# Patient Record
Sex: Female | Born: 1955 | Race: White | Hispanic: No | Marital: Married | Smoking: Former smoker
Health system: Southern US, Community
[De-identification: ages and names within clinical notes are randomized; demographics above are authoritative.]

## PROBLEM LIST (undated history)

## (undated) DIAGNOSIS — D649 Anemia, unspecified: Secondary | ICD-10-CM

## (undated) DIAGNOSIS — N186 End stage renal disease: Secondary | ICD-10-CM

## (undated) DIAGNOSIS — K219 Gastro-esophageal reflux disease without esophagitis: Secondary | ICD-10-CM

## (undated) DIAGNOSIS — Z9889 Other specified postprocedural states: Secondary | ICD-10-CM

## (undated) DIAGNOSIS — K254 Chronic or unspecified gastric ulcer with hemorrhage: Secondary | ICD-10-CM

## (undated) DIAGNOSIS — R112 Nausea with vomiting, unspecified: Secondary | ICD-10-CM

## (undated) DIAGNOSIS — T8859XA Other complications of anesthesia, initial encounter: Secondary | ICD-10-CM

## (undated) DIAGNOSIS — J189 Pneumonia, unspecified organism: Secondary | ICD-10-CM

## (undated) DIAGNOSIS — E785 Hyperlipidemia, unspecified: Secondary | ICD-10-CM

## (undated) DIAGNOSIS — K429 Umbilical hernia without obstruction or gangrene: Secondary | ICD-10-CM

## (undated) DIAGNOSIS — R197 Diarrhea, unspecified: Secondary | ICD-10-CM

## (undated) DIAGNOSIS — F419 Anxiety disorder, unspecified: Secondary | ICD-10-CM

## (undated) DIAGNOSIS — T4145XA Adverse effect of unspecified anesthetic, initial encounter: Secondary | ICD-10-CM

## (undated) DIAGNOSIS — R7303 Prediabetes: Secondary | ICD-10-CM

## (undated) DIAGNOSIS — I10 Essential (primary) hypertension: Secondary | ICD-10-CM

## (undated) HISTORY — PX: EYE SURGERY: SHX253

## (undated) HISTORY — PX: TOTAL ABDOMINAL HYSTERECTOMY W/ BILATERAL SALPINGOOPHORECTOMY: SHX83

## (undated) HISTORY — DX: Chronic or unspecified gastric ulcer with hemorrhage: K25.4

## (undated) HISTORY — DX: Prediabetes: R73.03

## (undated) HISTORY — PX: ABDOMINAL HYSTERECTOMY: SHX81

## (undated) HISTORY — DX: Anemia, unspecified: D64.9

## (undated) HISTORY — PX: TRABECULECTOMY: SHX107

## (undated) HISTORY — PX: CHOLECYSTECTOMY: SHX55

## (undated) HISTORY — PX: INSERTION OF DIALYSIS CATHETER: SHX1324

## (undated) HISTORY — PX: COLONOSCOPY W/ POLYPECTOMY: SHX1380

## (undated) HISTORY — DX: Hyperlipidemia, unspecified: E78.5

## (undated) HISTORY — PX: APPENDECTOMY: SHX54

## (undated) HISTORY — DX: Essential (primary) hypertension: I10

## (undated) HISTORY — DX: Gastro-esophageal reflux disease without esophagitis: K21.9

## (undated) HISTORY — PX: CHOLECYSTECTOMY OPEN: SUR202

---

## 2015-07-31 DIAGNOSIS — R112 Nausea with vomiting, unspecified: Secondary | ICD-10-CM | POA: Diagnosis not present

## 2015-07-31 DIAGNOSIS — D649 Anemia, unspecified: Secondary | ICD-10-CM | POA: Diagnosis not present

## 2015-07-31 DIAGNOSIS — N185 Chronic kidney disease, stage 5: Secondary | ICD-10-CM | POA: Diagnosis not present

## 2015-07-31 DIAGNOSIS — K253 Acute gastric ulcer without hemorrhage or perforation: Secondary | ICD-10-CM | POA: Diagnosis not present

## 2015-08-08 DIAGNOSIS — N390 Urinary tract infection, site not specified: Secondary | ICD-10-CM | POA: Diagnosis not present

## 2015-08-08 DIAGNOSIS — R809 Proteinuria, unspecified: Secondary | ICD-10-CM | POA: Diagnosis not present

## 2015-08-08 DIAGNOSIS — R112 Nausea with vomiting, unspecified: Secondary | ICD-10-CM | POA: Diagnosis not present

## 2015-08-08 DIAGNOSIS — N185 Chronic kidney disease, stage 5: Secondary | ICD-10-CM | POA: Diagnosis not present

## 2015-08-08 DIAGNOSIS — I10 Essential (primary) hypertension: Secondary | ICD-10-CM | POA: Diagnosis not present

## 2015-08-08 DIAGNOSIS — N184 Chronic kidney disease, stage 4 (severe): Secondary | ICD-10-CM | POA: Diagnosis not present

## 2015-08-08 DIAGNOSIS — K253 Acute gastric ulcer without hemorrhage or perforation: Secondary | ICD-10-CM | POA: Diagnosis not present

## 2015-08-15 DIAGNOSIS — A048 Other specified bacterial intestinal infections: Secondary | ICD-10-CM | POA: Diagnosis not present

## 2015-08-15 DIAGNOSIS — K253 Acute gastric ulcer without hemorrhage or perforation: Secondary | ICD-10-CM | POA: Diagnosis not present

## 2015-08-15 DIAGNOSIS — Z01818 Encounter for other preprocedural examination: Secondary | ICD-10-CM | POA: Diagnosis not present

## 2015-08-21 DIAGNOSIS — K2970 Gastritis, unspecified, without bleeding: Secondary | ICD-10-CM | POA: Diagnosis not present

## 2015-08-21 DIAGNOSIS — N185 Chronic kidney disease, stage 5: Secondary | ICD-10-CM | POA: Diagnosis not present

## 2015-08-21 DIAGNOSIS — K5281 Eosinophilic gastritis or gastroenteritis: Secondary | ICD-10-CM | POA: Diagnosis not present

## 2015-08-21 DIAGNOSIS — A048 Other specified bacterial intestinal infections: Secondary | ICD-10-CM | POA: Diagnosis not present

## 2015-08-21 DIAGNOSIS — K2270 Barrett's esophagus without dysplasia: Secondary | ICD-10-CM | POA: Diagnosis not present

## 2015-08-23 DIAGNOSIS — K200 Eosinophilic esophagitis: Secondary | ICD-10-CM | POA: Diagnosis not present

## 2015-08-24 DIAGNOSIS — K200 Eosinophilic esophagitis: Secondary | ICD-10-CM | POA: Diagnosis not present

## 2015-08-28 DIAGNOSIS — R112 Nausea with vomiting, unspecified: Secondary | ICD-10-CM | POA: Diagnosis not present

## 2015-08-28 DIAGNOSIS — R030 Elevated blood-pressure reading, without diagnosis of hypertension: Secondary | ICD-10-CM | POA: Diagnosis not present

## 2015-08-28 DIAGNOSIS — R1084 Generalized abdominal pain: Secondary | ICD-10-CM | POA: Diagnosis not present

## 2015-08-29 DIAGNOSIS — K2970 Gastritis, unspecified, without bleeding: Secondary | ICD-10-CM | POA: Diagnosis not present

## 2015-08-29 DIAGNOSIS — N184 Chronic kidney disease, stage 4 (severe): Secondary | ICD-10-CM | POA: Diagnosis not present

## 2015-08-29 DIAGNOSIS — B9681 Helicobacter pylori [H. pylori] as the cause of diseases classified elsewhere: Secondary | ICD-10-CM | POA: Diagnosis not present

## 2015-08-29 DIAGNOSIS — I129 Hypertensive chronic kidney disease with stage 1 through stage 4 chronic kidney disease, or unspecified chronic kidney disease: Secondary | ICD-10-CM | POA: Diagnosis not present

## 2015-08-30 DIAGNOSIS — I509 Heart failure, unspecified: Secondary | ICD-10-CM | POA: Diagnosis not present

## 2015-08-30 DIAGNOSIS — J95821 Acute postprocedural respiratory failure: Secondary | ICD-10-CM | POA: Diagnosis not present

## 2015-08-30 DIAGNOSIS — T363X5A Adverse effect of macrolides, initial encounter: Secondary | ICD-10-CM | POA: Diagnosis not present

## 2015-08-30 DIAGNOSIS — N184 Chronic kidney disease, stage 4 (severe): Secondary | ICD-10-CM | POA: Diagnosis not present

## 2015-08-30 DIAGNOSIS — K8020 Calculus of gallbladder without cholecystitis without obstruction: Secondary | ICD-10-CM | POA: Diagnosis not present

## 2015-08-30 DIAGNOSIS — K2900 Acute gastritis without bleeding: Secondary | ICD-10-CM | POA: Diagnosis not present

## 2015-08-30 DIAGNOSIS — E1142 Type 2 diabetes mellitus with diabetic polyneuropathy: Secondary | ICD-10-CM | POA: Diagnosis not present

## 2015-08-30 DIAGNOSIS — I10 Essential (primary) hypertension: Secondary | ICD-10-CM | POA: Diagnosis not present

## 2015-08-30 DIAGNOSIS — I132 Hypertensive heart and chronic kidney disease with heart failure and with stage 5 chronic kidney disease, or end stage renal disease: Secondary | ICD-10-CM | POA: Diagnosis not present

## 2015-08-30 DIAGNOSIS — Z6827 Body mass index (BMI) 27.0-27.9, adult: Secondary | ICD-10-CM | POA: Diagnosis not present

## 2015-08-30 DIAGNOSIS — K3184 Gastroparesis: Secondary | ICD-10-CM | POA: Diagnosis not present

## 2015-08-30 DIAGNOSIS — D631 Anemia in chronic kidney disease: Secondary | ICD-10-CM | POA: Diagnosis not present

## 2015-08-30 DIAGNOSIS — I5043 Acute on chronic combined systolic (congestive) and diastolic (congestive) heart failure: Secondary | ICD-10-CM | POA: Diagnosis not present

## 2015-08-30 DIAGNOSIS — R110 Nausea: Secondary | ICD-10-CM | POA: Diagnosis not present

## 2015-08-30 DIAGNOSIS — Z6831 Body mass index (BMI) 31.0-31.9, adult: Secondary | ICD-10-CM | POA: Diagnosis not present

## 2015-08-30 DIAGNOSIS — E1143 Type 2 diabetes mellitus with diabetic autonomic (poly)neuropathy: Secondary | ICD-10-CM | POA: Diagnosis not present

## 2015-08-30 DIAGNOSIS — K8012 Calculus of gallbladder with acute and chronic cholecystitis without obstruction: Secondary | ICD-10-CM | POA: Diagnosis not present

## 2015-08-30 DIAGNOSIS — K8010 Calculus of gallbladder with chronic cholecystitis without obstruction: Secondary | ICD-10-CM | POA: Diagnosis not present

## 2015-08-30 DIAGNOSIS — E1121 Type 2 diabetes mellitus with diabetic nephropathy: Secondary | ICD-10-CM | POA: Diagnosis not present

## 2015-08-30 DIAGNOSIS — Z6833 Body mass index (BMI) 33.0-33.9, adult: Secondary | ICD-10-CM | POA: Diagnosis not present

## 2015-08-30 DIAGNOSIS — Z6832 Body mass index (BMI) 32.0-32.9, adult: Secondary | ICD-10-CM | POA: Diagnosis not present

## 2015-08-30 DIAGNOSIS — E876 Hypokalemia: Secondary | ICD-10-CM | POA: Diagnosis not present

## 2015-08-30 DIAGNOSIS — N189 Chronic kidney disease, unspecified: Secondary | ICD-10-CM | POA: Diagnosis not present

## 2015-08-30 DIAGNOSIS — R112 Nausea with vomiting, unspecified: Secondary | ICD-10-CM | POA: Diagnosis not present

## 2015-08-31 DIAGNOSIS — R112 Nausea with vomiting, unspecified: Secondary | ICD-10-CM | POA: Diagnosis not present

## 2015-08-31 DIAGNOSIS — K8020 Calculus of gallbladder without cholecystitis without obstruction: Secondary | ICD-10-CM | POA: Diagnosis not present

## 2015-08-31 DIAGNOSIS — N184 Chronic kidney disease, stage 4 (severe): Secondary | ICD-10-CM | POA: Diagnosis not present

## 2015-09-01 DIAGNOSIS — R112 Nausea with vomiting, unspecified: Secondary | ICD-10-CM | POA: Diagnosis not present

## 2015-09-01 DIAGNOSIS — E1121 Type 2 diabetes mellitus with diabetic nephropathy: Secondary | ICD-10-CM | POA: Diagnosis not present

## 2015-09-02 DIAGNOSIS — E1121 Type 2 diabetes mellitus with diabetic nephropathy: Secondary | ICD-10-CM | POA: Diagnosis not present

## 2015-09-02 DIAGNOSIS — R112 Nausea with vomiting, unspecified: Secondary | ICD-10-CM | POA: Diagnosis not present

## 2015-09-03 DIAGNOSIS — R112 Nausea with vomiting, unspecified: Secondary | ICD-10-CM | POA: Diagnosis not present

## 2015-09-03 DIAGNOSIS — K8020 Calculus of gallbladder without cholecystitis without obstruction: Secondary | ICD-10-CM | POA: Diagnosis not present

## 2015-09-03 DIAGNOSIS — Z6832 Body mass index (BMI) 32.0-32.9, adult: Secondary | ICD-10-CM | POA: Diagnosis not present

## 2015-09-03 DIAGNOSIS — K8010 Calculus of gallbladder with chronic cholecystitis without obstruction: Secondary | ICD-10-CM | POA: Diagnosis not present

## 2015-09-03 DIAGNOSIS — I10 Essential (primary) hypertension: Secondary | ICD-10-CM | POA: Diagnosis not present

## 2015-09-03 DIAGNOSIS — J95821 Acute postprocedural respiratory failure: Secondary | ICD-10-CM | POA: Diagnosis not present

## 2015-09-03 DIAGNOSIS — N184 Chronic kidney disease, stage 4 (severe): Secondary | ICD-10-CM | POA: Diagnosis not present

## 2015-09-04 DIAGNOSIS — Z6827 Body mass index (BMI) 27.0-27.9, adult: Secondary | ICD-10-CM | POA: Diagnosis not present

## 2015-09-04 DIAGNOSIS — I509 Heart failure, unspecified: Secondary | ICD-10-CM | POA: Diagnosis not present

## 2015-09-04 DIAGNOSIS — R112 Nausea with vomiting, unspecified: Secondary | ICD-10-CM | POA: Diagnosis not present

## 2015-09-04 DIAGNOSIS — N184 Chronic kidney disease, stage 4 (severe): Secondary | ICD-10-CM | POA: Diagnosis not present

## 2015-09-04 DIAGNOSIS — D631 Anemia in chronic kidney disease: Secondary | ICD-10-CM | POA: Diagnosis not present

## 2015-09-04 DIAGNOSIS — J95821 Acute postprocedural respiratory failure: Secondary | ICD-10-CM | POA: Diagnosis not present

## 2015-09-04 DIAGNOSIS — I10 Essential (primary) hypertension: Secondary | ICD-10-CM | POA: Diagnosis not present

## 2015-09-05 DIAGNOSIS — N184 Chronic kidney disease, stage 4 (severe): Secondary | ICD-10-CM | POA: Diagnosis not present

## 2015-09-05 DIAGNOSIS — Z6827 Body mass index (BMI) 27.0-27.9, adult: Secondary | ICD-10-CM | POA: Diagnosis not present

## 2015-09-05 DIAGNOSIS — K8012 Calculus of gallbladder with acute and chronic cholecystitis without obstruction: Secondary | ICD-10-CM | POA: Diagnosis not present

## 2015-09-05 DIAGNOSIS — I10 Essential (primary) hypertension: Secondary | ICD-10-CM | POA: Diagnosis not present

## 2015-09-05 DIAGNOSIS — D631 Anemia in chronic kidney disease: Secondary | ICD-10-CM | POA: Diagnosis not present

## 2015-09-05 DIAGNOSIS — R112 Nausea with vomiting, unspecified: Secondary | ICD-10-CM | POA: Diagnosis not present

## 2015-09-06 DIAGNOSIS — N184 Chronic kidney disease, stage 4 (severe): Secondary | ICD-10-CM | POA: Diagnosis not present

## 2015-09-06 DIAGNOSIS — R112 Nausea with vomiting, unspecified: Secondary | ICD-10-CM | POA: Diagnosis not present

## 2015-09-06 DIAGNOSIS — D631 Anemia in chronic kidney disease: Secondary | ICD-10-CM | POA: Diagnosis not present

## 2015-09-06 DIAGNOSIS — I10 Essential (primary) hypertension: Secondary | ICD-10-CM | POA: Diagnosis not present

## 2015-09-06 DIAGNOSIS — Z6833 Body mass index (BMI) 33.0-33.9, adult: Secondary | ICD-10-CM | POA: Diagnosis not present

## 2015-09-07 DIAGNOSIS — R112 Nausea with vomiting, unspecified: Secondary | ICD-10-CM | POA: Diagnosis not present

## 2015-09-07 DIAGNOSIS — Z6831 Body mass index (BMI) 31.0-31.9, adult: Secondary | ICD-10-CM | POA: Diagnosis not present

## 2015-09-07 DIAGNOSIS — D631 Anemia in chronic kidney disease: Secondary | ICD-10-CM | POA: Diagnosis not present

## 2015-09-07 DIAGNOSIS — I10 Essential (primary) hypertension: Secondary | ICD-10-CM | POA: Diagnosis not present

## 2015-09-07 DIAGNOSIS — N184 Chronic kidney disease, stage 4 (severe): Secondary | ICD-10-CM | POA: Diagnosis not present

## 2015-09-08 DIAGNOSIS — D631 Anemia in chronic kidney disease: Secondary | ICD-10-CM | POA: Diagnosis not present

## 2015-09-08 DIAGNOSIS — I10 Essential (primary) hypertension: Secondary | ICD-10-CM | POA: Diagnosis not present

## 2015-09-08 DIAGNOSIS — K3184 Gastroparesis: Secondary | ICD-10-CM | POA: Diagnosis not present

## 2015-09-08 DIAGNOSIS — E1143 Type 2 diabetes mellitus with diabetic autonomic (poly)neuropathy: Secondary | ICD-10-CM | POA: Diagnosis not present

## 2015-09-08 DIAGNOSIS — N184 Chronic kidney disease, stage 4 (severe): Secondary | ICD-10-CM | POA: Diagnosis not present

## 2015-09-08 DIAGNOSIS — N189 Chronic kidney disease, unspecified: Secondary | ICD-10-CM | POA: Diagnosis not present

## 2015-09-09 DIAGNOSIS — N184 Chronic kidney disease, stage 4 (severe): Secondary | ICD-10-CM | POA: Diagnosis not present

## 2015-09-09 DIAGNOSIS — E1143 Type 2 diabetes mellitus with diabetic autonomic (poly)neuropathy: Secondary | ICD-10-CM | POA: Diagnosis not present

## 2015-09-09 DIAGNOSIS — I10 Essential (primary) hypertension: Secondary | ICD-10-CM | POA: Diagnosis not present

## 2015-09-09 DIAGNOSIS — D631 Anemia in chronic kidney disease: Secondary | ICD-10-CM | POA: Diagnosis not present

## 2015-09-09 DIAGNOSIS — N189 Chronic kidney disease, unspecified: Secondary | ICD-10-CM | POA: Diagnosis not present

## 2015-09-09 DIAGNOSIS — K3184 Gastroparesis: Secondary | ICD-10-CM | POA: Diagnosis not present

## 2015-09-14 DIAGNOSIS — N185 Chronic kidney disease, stage 5: Secondary | ICD-10-CM | POA: Diagnosis not present

## 2015-09-14 DIAGNOSIS — R112 Nausea with vomiting, unspecified: Secondary | ICD-10-CM | POA: Diagnosis not present

## 2015-10-01 DIAGNOSIS — K635 Polyp of colon: Secondary | ICD-10-CM | POA: Diagnosis not present

## 2015-10-01 DIAGNOSIS — R739 Hyperglycemia, unspecified: Secondary | ICD-10-CM | POA: Diagnosis not present

## 2015-10-01 DIAGNOSIS — Z1321 Encounter for screening for nutritional disorder: Secondary | ICD-10-CM | POA: Diagnosis not present

## 2015-10-08 DIAGNOSIS — I10 Essential (primary) hypertension: Secondary | ICD-10-CM | POA: Diagnosis not present

## 2015-10-08 DIAGNOSIS — Z13220 Encounter for screening for lipoid disorders: Secondary | ICD-10-CM | POA: Diagnosis not present

## 2015-10-08 DIAGNOSIS — R739 Hyperglycemia, unspecified: Secondary | ICD-10-CM | POA: Diagnosis not present

## 2015-10-08 DIAGNOSIS — Z1321 Encounter for screening for nutritional disorder: Secondary | ICD-10-CM | POA: Diagnosis not present

## 2015-10-08 DIAGNOSIS — I5043 Acute on chronic combined systolic (congestive) and diastolic (congestive) heart failure: Secondary | ICD-10-CM | POA: Diagnosis not present

## 2015-10-08 DIAGNOSIS — D509 Iron deficiency anemia, unspecified: Secondary | ICD-10-CM | POA: Diagnosis not present

## 2015-10-08 DIAGNOSIS — L853 Xerosis cutis: Secondary | ICD-10-CM | POA: Diagnosis not present

## 2015-10-10 DIAGNOSIS — N184 Chronic kidney disease, stage 4 (severe): Secondary | ICD-10-CM | POA: Diagnosis not present

## 2015-10-10 DIAGNOSIS — I10 Essential (primary) hypertension: Secondary | ICD-10-CM | POA: Diagnosis not present

## 2015-10-10 DIAGNOSIS — D631 Anemia in chronic kidney disease: Secondary | ICD-10-CM | POA: Diagnosis not present

## 2015-10-10 DIAGNOSIS — N390 Urinary tract infection, site not specified: Secondary | ICD-10-CM | POA: Diagnosis not present

## 2015-10-15 DIAGNOSIS — N184 Chronic kidney disease, stage 4 (severe): Secondary | ICD-10-CM | POA: Diagnosis not present

## 2015-10-25 DIAGNOSIS — H4313 Vitreous hemorrhage, bilateral: Secondary | ICD-10-CM | POA: Diagnosis not present

## 2015-11-07 DIAGNOSIS — R7303 Prediabetes: Secondary | ICD-10-CM | POA: Diagnosis not present

## 2015-11-07 DIAGNOSIS — N185 Chronic kidney disease, stage 5: Secondary | ICD-10-CM | POA: Diagnosis not present

## 2015-11-07 DIAGNOSIS — R0602 Shortness of breath: Secondary | ICD-10-CM | POA: Diagnosis not present

## 2015-11-07 DIAGNOSIS — I132 Hypertensive heart and chronic kidney disease with heart failure and with stage 5 chronic kidney disease, or end stage renal disease: Secondary | ICD-10-CM | POA: Diagnosis not present

## 2015-11-07 DIAGNOSIS — I509 Heart failure, unspecified: Secondary | ICD-10-CM | POA: Diagnosis not present

## 2015-11-07 DIAGNOSIS — Z79899 Other long term (current) drug therapy: Secondary | ICD-10-CM | POA: Diagnosis not present

## 2015-11-07 DIAGNOSIS — D500 Iron deficiency anemia secondary to blood loss (chronic): Secondary | ICD-10-CM | POA: Diagnosis not present

## 2015-11-09 DIAGNOSIS — R0602 Shortness of breath: Secondary | ICD-10-CM | POA: Diagnosis not present

## 2015-12-10 DIAGNOSIS — I132 Hypertensive heart and chronic kidney disease with heart failure and with stage 5 chronic kidney disease, or end stage renal disease: Secondary | ICD-10-CM | POA: Diagnosis not present

## 2015-12-10 DIAGNOSIS — I509 Heart failure, unspecified: Secondary | ICD-10-CM | POA: Diagnosis not present

## 2015-12-10 DIAGNOSIS — N185 Chronic kidney disease, stage 5: Secondary | ICD-10-CM | POA: Diagnosis not present

## 2015-12-17 DIAGNOSIS — I129 Hypertensive chronic kidney disease with stage 1 through stage 4 chronic kidney disease, or unspecified chronic kidney disease: Secondary | ICD-10-CM | POA: Diagnosis not present

## 2015-12-17 DIAGNOSIS — N184 Chronic kidney disease, stage 4 (severe): Secondary | ICD-10-CM | POA: Diagnosis not present

## 2015-12-17 DIAGNOSIS — I10 Essential (primary) hypertension: Secondary | ICD-10-CM | POA: Diagnosis not present

## 2015-12-17 DIAGNOSIS — D631 Anemia in chronic kidney disease: Secondary | ICD-10-CM | POA: Diagnosis not present

## 2015-12-24 DIAGNOSIS — D638 Anemia in other chronic diseases classified elsewhere: Secondary | ICD-10-CM | POA: Diagnosis not present

## 2015-12-24 DIAGNOSIS — N184 Chronic kidney disease, stage 4 (severe): Secondary | ICD-10-CM | POA: Diagnosis not present

## 2015-12-24 DIAGNOSIS — D631 Anemia in chronic kidney disease: Secondary | ICD-10-CM | POA: Diagnosis not present

## 2016-01-10 DIAGNOSIS — H4313 Vitreous hemorrhage, bilateral: Secondary | ICD-10-CM | POA: Diagnosis not present

## 2016-01-15 DIAGNOSIS — D638 Anemia in other chronic diseases classified elsewhere: Secondary | ICD-10-CM | POA: Diagnosis not present

## 2016-01-15 DIAGNOSIS — N184 Chronic kidney disease, stage 4 (severe): Secondary | ICD-10-CM | POA: Diagnosis not present

## 2016-01-21 DIAGNOSIS — Z01818 Encounter for other preprocedural examination: Secondary | ICD-10-CM | POA: Diagnosis not present

## 2016-01-21 DIAGNOSIS — H25811 Combined forms of age-related cataract, right eye: Secondary | ICD-10-CM | POA: Diagnosis not present

## 2016-01-21 DIAGNOSIS — H25041 Posterior subcapsular polar age-related cataract, right eye: Secondary | ICD-10-CM | POA: Diagnosis not present

## 2016-01-29 DIAGNOSIS — D638 Anemia in other chronic diseases classified elsewhere: Secondary | ICD-10-CM | POA: Diagnosis not present

## 2016-01-29 DIAGNOSIS — N184 Chronic kidney disease, stage 4 (severe): Secondary | ICD-10-CM | POA: Diagnosis not present

## 2016-02-05 DIAGNOSIS — N184 Chronic kidney disease, stage 4 (severe): Secondary | ICD-10-CM | POA: Diagnosis not present

## 2016-02-05 DIAGNOSIS — D631 Anemia in chronic kidney disease: Secondary | ICD-10-CM | POA: Diagnosis not present

## 2016-02-05 DIAGNOSIS — I129 Hypertensive chronic kidney disease with stage 1 through stage 4 chronic kidney disease, or unspecified chronic kidney disease: Secondary | ICD-10-CM | POA: Diagnosis not present

## 2016-02-05 DIAGNOSIS — I10 Essential (primary) hypertension: Secondary | ICD-10-CM | POA: Diagnosis not present

## 2016-02-06 DIAGNOSIS — D509 Iron deficiency anemia, unspecified: Secondary | ICD-10-CM | POA: Diagnosis not present

## 2016-02-07 DIAGNOSIS — H4313 Vitreous hemorrhage, bilateral: Secondary | ICD-10-CM | POA: Diagnosis not present

## 2016-02-12 DIAGNOSIS — N184 Chronic kidney disease, stage 4 (severe): Secondary | ICD-10-CM | POA: Diagnosis not present

## 2016-02-12 DIAGNOSIS — D638 Anemia in other chronic diseases classified elsewhere: Secondary | ICD-10-CM | POA: Diagnosis not present

## 2016-02-13 DIAGNOSIS — N184 Chronic kidney disease, stage 4 (severe): Secondary | ICD-10-CM | POA: Diagnosis not present

## 2016-02-13 DIAGNOSIS — D509 Iron deficiency anemia, unspecified: Secondary | ICD-10-CM | POA: Diagnosis not present

## 2016-02-13 DIAGNOSIS — D638 Anemia in other chronic diseases classified elsewhere: Secondary | ICD-10-CM | POA: Diagnosis not present

## 2016-02-26 DIAGNOSIS — N184 Chronic kidney disease, stage 4 (severe): Secondary | ICD-10-CM | POA: Diagnosis not present

## 2016-02-26 DIAGNOSIS — D638 Anemia in other chronic diseases classified elsewhere: Secondary | ICD-10-CM | POA: Diagnosis not present

## 2016-02-27 DIAGNOSIS — D638 Anemia in other chronic diseases classified elsewhere: Secondary | ICD-10-CM | POA: Diagnosis not present

## 2016-02-27 DIAGNOSIS — N184 Chronic kidney disease, stage 4 (severe): Secondary | ICD-10-CM | POA: Diagnosis not present

## 2016-03-04 DIAGNOSIS — H3341 Traction detachment of retina, right eye: Secondary | ICD-10-CM | POA: Diagnosis not present

## 2016-03-04 DIAGNOSIS — H4311 Vitreous hemorrhage, right eye: Secondary | ICD-10-CM | POA: Diagnosis not present

## 2016-03-11 DIAGNOSIS — N184 Chronic kidney disease, stage 4 (severe): Secondary | ICD-10-CM | POA: Diagnosis not present

## 2016-03-11 DIAGNOSIS — D638 Anemia in other chronic diseases classified elsewhere: Secondary | ICD-10-CM | POA: Diagnosis not present

## 2016-03-25 DIAGNOSIS — D638 Anemia in other chronic diseases classified elsewhere: Secondary | ICD-10-CM | POA: Diagnosis not present

## 2016-03-25 DIAGNOSIS — N184 Chronic kidney disease, stage 4 (severe): Secondary | ICD-10-CM | POA: Diagnosis not present

## 2016-04-03 DIAGNOSIS — H4312 Vitreous hemorrhage, left eye: Secondary | ICD-10-CM | POA: Diagnosis not present

## 2016-04-03 DIAGNOSIS — H35371 Puckering of macula, right eye: Secondary | ICD-10-CM | POA: Diagnosis not present

## 2016-04-08 DIAGNOSIS — N184 Chronic kidney disease, stage 4 (severe): Secondary | ICD-10-CM | POA: Diagnosis not present

## 2016-04-08 DIAGNOSIS — D638 Anemia in other chronic diseases classified elsewhere: Secondary | ICD-10-CM | POA: Diagnosis not present

## 2016-04-17 DIAGNOSIS — I10 Essential (primary) hypertension: Secondary | ICD-10-CM | POA: Diagnosis not present

## 2016-04-17 DIAGNOSIS — D631 Anemia in chronic kidney disease: Secondary | ICD-10-CM | POA: Diagnosis not present

## 2016-04-17 DIAGNOSIS — N184 Chronic kidney disease, stage 4 (severe): Secondary | ICD-10-CM | POA: Diagnosis not present

## 2016-04-17 DIAGNOSIS — I129 Hypertensive chronic kidney disease with stage 1 through stage 4 chronic kidney disease, or unspecified chronic kidney disease: Secondary | ICD-10-CM | POA: Diagnosis not present

## 2016-04-22 DIAGNOSIS — D638 Anemia in other chronic diseases classified elsewhere: Secondary | ICD-10-CM | POA: Diagnosis not present

## 2016-04-22 DIAGNOSIS — Z880 Allergy status to penicillin: Secondary | ICD-10-CM | POA: Diagnosis not present

## 2016-04-22 DIAGNOSIS — N184 Chronic kidney disease, stage 4 (severe): Secondary | ICD-10-CM | POA: Diagnosis not present

## 2016-04-22 DIAGNOSIS — Z882 Allergy status to sulfonamides status: Secondary | ICD-10-CM | POA: Diagnosis not present

## 2016-04-23 ENCOUNTER — Other Ambulatory Visit: Payer: Self-pay | Admitting: Vascular Surgery

## 2016-04-23 DIAGNOSIS — N185 Chronic kidney disease, stage 5: Secondary | ICD-10-CM

## 2016-04-23 DIAGNOSIS — Z01810 Encounter for preprocedural cardiovascular examination: Secondary | ICD-10-CM

## 2016-05-06 DIAGNOSIS — E119 Type 2 diabetes mellitus without complications: Secondary | ICD-10-CM | POA: Diagnosis not present

## 2016-05-06 DIAGNOSIS — D638 Anemia in other chronic diseases classified elsewhere: Secondary | ICD-10-CM | POA: Diagnosis not present

## 2016-05-06 DIAGNOSIS — D631 Anemia in chronic kidney disease: Secondary | ICD-10-CM | POA: Diagnosis not present

## 2016-05-06 DIAGNOSIS — N184 Chronic kidney disease, stage 4 (severe): Secondary | ICD-10-CM | POA: Diagnosis not present

## 2016-05-08 ENCOUNTER — Encounter: Payer: Self-pay | Admitting: Vascular Surgery

## 2016-05-08 ENCOUNTER — Ambulatory Visit (HOSPITAL_COMMUNITY)
Admission: RE | Admit: 2016-05-08 | Discharge: 2016-05-08 | Disposition: A | Discharge status: Home / Self Care | Payer: BLUE CROSS/BLUE SHIELD | Source: Ambulatory Visit | Attending: Vascular Surgery | Admitting: Vascular Surgery

## 2016-05-08 ENCOUNTER — Ambulatory Visit (INDEPENDENT_AMBULATORY_CARE_PROVIDER_SITE_OTHER)
Admission: RE | Admit: 2016-05-08 | Discharge: 2016-05-08 | Disposition: A | Discharge status: Home / Self Care | Payer: BLUE CROSS/BLUE SHIELD | Source: Ambulatory Visit | Attending: Vascular Surgery | Admitting: Vascular Surgery

## 2016-05-08 DIAGNOSIS — N185 Chronic kidney disease, stage 5: Secondary | ICD-10-CM

## 2016-05-08 DIAGNOSIS — Z01810 Encounter for preprocedural cardiovascular examination: Secondary | ICD-10-CM | POA: Diagnosis not present

## 2016-05-13 ENCOUNTER — Encounter: Payer: Self-pay | Admitting: Vascular Surgery

## 2016-05-13 ENCOUNTER — Ambulatory Visit (INDEPENDENT_AMBULATORY_CARE_PROVIDER_SITE_OTHER): Payer: BLUE CROSS/BLUE SHIELD | Admitting: Vascular Surgery

## 2016-05-13 VITALS — BP 158/77 | HR 55 | Temp 97.6°F | Resp 16 | Ht 63.5 in | Wt 163.0 lb

## 2016-05-13 DIAGNOSIS — N184 Chronic kidney disease, stage 4 (severe): Secondary | ICD-10-CM | POA: Diagnosis not present

## 2016-05-13 NOTE — Progress Notes (Signed)
V[facility] and Vein Specialist of [place]  Patient name: Alison Baker MRN: [id number] DOB: 25-Aug-1955 Sex: female  REASON FOR CONSULT: Evaluation for renal access for hemodialysis  HPI: Alison Baker is a 61 y.o. female, who is seen today for discussion of possible hemodialysis access. We have been requested to see her in place a fistula if appropriate and not place a graft until closer to needing dialysis if she is not a fistula candidate. She has never been on hemodialysis. Has had progressive chronic kidney disease related to hypertension. She is here today with her husband.  Past Medical History:  Diagnosis Date  . Anemia   . Chronic kidney disease   . Gastric ulcer with hemorrhage   . GERD (gastroesophageal reflux disease)   . Hypertension   . Pre-diabetes     No family history on file.  SOCIAL HISTORY: Social History   Social History  . Marital status: Married    Spouse name: N/A  . Number of children: N/A  . Years of education: N/A   Occupational History  . Not on file.   Social History Main Topics  . Smoking status: Former Smoker    Types: Cigars    Quit date: 06/27/1995  . Smokeless tobacco: Never Used  . Alcohol use No  . Drug use: No  . Sexual activity: Not on file   Other Topics Concern  . Not on file   Social History Narrative  . No narrative on file    Allergies  Allergen Reactions  . Strawberry Extract Swelling  . Nsaids Other (See Comments)    Has a healing stomach ulcer  . Amoxicillin Rash  . Clonidine Nausea And Vomiting  . Penicillins Rash    Current Outpatient Prescriptions  Medication Sig Dispense Refill  . acetaminophen (TYLENOL) 325 MG tablet Take 650 mg by mouth every 6 (six) hours as needed.    Marland Kitchen amLODipine (NORVASC) 10 MG tablet Take 10 mg by mouth.    . calcium carbonate (TUMS - DOSED IN MG ELEMENTAL CALCIUM) 500 MG chewable tablet Chew 1 tablet by mouth daily.    Marland Kitchen epoetin alfa (PROCRIT)  20000 UNIT/ML injection Inject 20,000 Units into the skin every 14 (fourteen) days.    Marland Kitchen labetalol (NORMODYNE) 300 MG tablet Take 300 mg by mouth.    . torsemide (DEMADEX) 20 MG tablet Take 40 mg by mouth once.     No current facility-administered medications for this visit.     REVIEW OF SYSTEMS:  [X]  denotes positive finding, [ ]  denotes negative finding Cardiac  Comments:  Chest pain or chest pressure:    Shortness of breath upon exertion:    Short of breath when lying flat:    Irregular heart rhythm:        Vascular    Pain in calf, thigh, or hip brought on by ambulation:    Pain in feet at night that wakes you up from your sleep:     Blood clot in your veins:    Leg swelling:  x       Pulmonary    Oxygen at home:    Productive cough:     Wheezing:         Neurologic    Sudden weakness in arms or legs:     Sudden numbness in arms or legs:     Sudden onset of difficulty speaking or slurred speech:    Temporary loss of vision in one eye:  Problems with dizziness:         Gastrointestinal    Blood in stool:     Vomited blood:         Genitourinary    Burning when urinating:     Blood in urine:        Psychiatric    Major depression:         Hematologic    Bleeding problems:    Problems with blood clotting too easily:        Skin    Rashes or ulcers:        Constitutional    Fever or chills:      PHYSICAL EXAM: Vitals:   05/13/16 1156  BP: (!) 158/77  Pulse: (!) 55  Resp: 16  Temp: 97.6 F (36.4 C)  TempSrc: Oral  SpO2: 100%  Weight: 163 lb (73.9 kg)  Height: 5' 3.5" (1.613 m)    GENERAL: The patient is a well-nourished female, in no acute distress. The vital signs are documented above. CARDIOVASCULAR: 2+ radial pulses bilaterally. Very small surface veins bilaterally. Her cephalic veins are visible at the wrist and antecubital space PULMONARY: There is good air exchange  ABDOMEN: Soft and non-tender  MUSCULOSKELETAL: There are no major  deformities or cyanosis. NEUROLOGIC: No focal weakness or paresthesias are detected. SKIN: There are no ulcers or rashes noted. PSYCHIATRIC: The patient has a normal affect.  DATA:  Upper extremity arterial and venous studies were reviewed with the patient and her husband. This shows extremely small cephalic vein in the 2 mm range throughout the lower and upper arms. The basilic vein is also small. It does is larger than the cephalic vein joins the deep vein in the mid upper arm bilaterally  MEDICAL ISSUES: Had long discussion with patient and her husband regarding access for hemodialysis. Discussed tunneled catheters, AV fistula and AV grafts. She does not appear to be a candidate for fistula attempt. I explained that I felt her chance for acceptable fistula maturation is extremely low with her very small size. I imaged these veins with SonoSite ultrasound myself and they are extremely small. I would recommend left arm AV graft if she approaches need for hemodialysis. She understands and will see [place] again on as-needed basis   Rosetta Posner, MD [facility] Ve[facility] Specialists of [facility] Tel [phone] Pager [phone]

## 2016-05-19 DIAGNOSIS — I120 Hypertensive chronic kidney disease with stage 5 chronic kidney disease or end stage renal disease: Secondary | ICD-10-CM | POA: Diagnosis not present

## 2016-05-19 DIAGNOSIS — I10 Essential (primary) hypertension: Secondary | ICD-10-CM | POA: Diagnosis not present

## 2016-05-19 DIAGNOSIS — N051 Unspecified nephritic syndrome with focal and segmental glomerular lesions: Secondary | ICD-10-CM | POA: Diagnosis not present

## 2016-05-19 DIAGNOSIS — K432 Incisional hernia without obstruction or gangrene: Secondary | ICD-10-CM | POA: Diagnosis not present

## 2016-05-22 DIAGNOSIS — D638 Anemia in other chronic diseases classified elsewhere: Secondary | ICD-10-CM | POA: Diagnosis not present

## 2016-05-22 DIAGNOSIS — N184 Chronic kidney disease, stage 4 (severe): Secondary | ICD-10-CM | POA: Diagnosis not present

## 2016-05-22 DIAGNOSIS — E119 Type 2 diabetes mellitus without complications: Secondary | ICD-10-CM | POA: Diagnosis not present

## 2016-05-22 DIAGNOSIS — D631 Anemia in chronic kidney disease: Secondary | ICD-10-CM | POA: Diagnosis not present

## 2016-06-04 DIAGNOSIS — J22 Unspecified acute lower respiratory infection: Secondary | ICD-10-CM | POA: Diagnosis not present

## 2016-06-04 DIAGNOSIS — J101 Influenza due to other identified influenza virus with other respiratory manifestations: Secondary | ICD-10-CM | POA: Diagnosis not present

## 2016-06-12 ENCOUNTER — Inpatient Hospital Stay (HOSPITAL_COMMUNITY)
Admission: AD | Admit: 2016-06-12 | Discharge: 2016-06-24 | DRG: 674 | Disposition: A | Discharge status: Home / Self Care | Payer: BLUE CROSS/BLUE SHIELD | Source: Other Acute Inpatient Hospital | Attending: Internal Medicine | Admitting: Internal Medicine

## 2016-06-12 DIAGNOSIS — N1330 Unspecified hydronephrosis: Secondary | ICD-10-CM | POA: Diagnosis not present

## 2016-06-12 DIAGNOSIS — R1111 Vomiting without nausea: Secondary | ICD-10-CM | POA: Diagnosis not present

## 2016-06-12 DIAGNOSIS — N136 Pyonephrosis: Secondary | ICD-10-CM | POA: Diagnosis not present

## 2016-06-12 DIAGNOSIS — N19 Unspecified kidney failure: Secondary | ICD-10-CM

## 2016-06-12 DIAGNOSIS — I10 Essential (primary) hypertension: Secondary | ICD-10-CM | POA: Diagnosis present

## 2016-06-12 DIAGNOSIS — N151 Renal and perinephric abscess: Secondary | ICD-10-CM | POA: Diagnosis not present

## 2016-06-12 DIAGNOSIS — N12 Tubulo-interstitial nephritis, not specified as acute or chronic: Secondary | ICD-10-CM | POA: Diagnosis not present

## 2016-06-12 DIAGNOSIS — Z4901 Encounter for fitting and adjustment of extracorporeal dialysis catheter: Secondary | ICD-10-CM | POA: Diagnosis not present

## 2016-06-12 DIAGNOSIS — N185 Chronic kidney disease, stage 5: Secondary | ICD-10-CM | POA: Diagnosis not present

## 2016-06-12 DIAGNOSIS — N186 End stage renal disease: Secondary | ICD-10-CM | POA: Diagnosis not present

## 2016-06-12 DIAGNOSIS — N189 Chronic kidney disease, unspecified: Secondary | ICD-10-CM | POA: Diagnosis present

## 2016-06-12 DIAGNOSIS — Z8711 Personal history of peptic ulcer disease: Secondary | ICD-10-CM | POA: Diagnosis not present

## 2016-06-12 DIAGNOSIS — Z87891 Personal history of nicotine dependence: Secondary | ICD-10-CM

## 2016-06-12 DIAGNOSIS — E876 Hypokalemia: Secondary | ICD-10-CM | POA: Diagnosis not present

## 2016-06-12 DIAGNOSIS — E861 Hypovolemia: Secondary | ICD-10-CM | POA: Diagnosis present

## 2016-06-12 DIAGNOSIS — Z452 Encounter for adjustment and management of vascular access device: Secondary | ICD-10-CM

## 2016-06-12 DIAGNOSIS — Z992 Dependence on renal dialysis: Secondary | ICD-10-CM

## 2016-06-12 DIAGNOSIS — N289 Disorder of kidney and ureter, unspecified: Secondary | ICD-10-CM | POA: Diagnosis not present

## 2016-06-12 DIAGNOSIS — I120 Hypertensive chronic kidney disease with stage 5 chronic kidney disease or end stage renal disease: Secondary | ICD-10-CM | POA: Diagnosis present

## 2016-06-12 DIAGNOSIS — R112 Nausea with vomiting, unspecified: Secondary | ICD-10-CM | POA: Diagnosis not present

## 2016-06-12 DIAGNOSIS — B9620 Unspecified Escherichia coli [E. coli] as the cause of diseases classified elsewhere: Secondary | ICD-10-CM | POA: Diagnosis not present

## 2016-06-12 DIAGNOSIS — R7303 Prediabetes: Secondary | ICD-10-CM | POA: Diagnosis present

## 2016-06-12 DIAGNOSIS — R011 Cardiac murmur, unspecified: Secondary | ICD-10-CM | POA: Diagnosis not present

## 2016-06-12 DIAGNOSIS — R42 Dizziness and giddiness: Secondary | ICD-10-CM | POA: Diagnosis not present

## 2016-06-12 DIAGNOSIS — B951 Streptococcus, group B, as the cause of diseases classified elsewhere: Secondary | ICD-10-CM | POA: Diagnosis not present

## 2016-06-12 DIAGNOSIS — R1084 Generalized abdominal pain: Secondary | ICD-10-CM | POA: Diagnosis not present

## 2016-06-12 DIAGNOSIS — Z79899 Other long term (current) drug therapy: Secondary | ICD-10-CM | POA: Diagnosis not present

## 2016-06-12 DIAGNOSIS — N390 Urinary tract infection, site not specified: Secondary | ICD-10-CM | POA: Diagnosis present

## 2016-06-12 DIAGNOSIS — D631 Anemia in chronic kidney disease: Secondary | ICD-10-CM | POA: Diagnosis not present

## 2016-06-12 DIAGNOSIS — K219 Gastro-esophageal reflux disease without esophagitis: Secondary | ICD-10-CM | POA: Diagnosis present

## 2016-06-12 DIAGNOSIS — J101 Influenza due to other identified influenza virus with other respiratory manifestations: Secondary | ICD-10-CM | POA: Diagnosis not present

## 2016-06-12 DIAGNOSIS — G43A0 Cyclical vomiting, not intractable: Secondary | ICD-10-CM | POA: Diagnosis not present

## 2016-06-12 DIAGNOSIS — I129 Hypertensive chronic kidney disease with stage 1 through stage 4 chronic kidney disease, or unspecified chronic kidney disease: Secondary | ICD-10-CM | POA: Diagnosis not present

## 2016-06-12 DIAGNOSIS — N2889 Other specified disorders of kidney and ureter: Secondary | ICD-10-CM | POA: Diagnosis not present

## 2016-06-12 DIAGNOSIS — R197 Diarrhea, unspecified: Secondary | ICD-10-CM | POA: Diagnosis not present

## 2016-06-12 DIAGNOSIS — Z8249 Family history of ischemic heart disease and other diseases of the circulatory system: Secondary | ICD-10-CM

## 2016-06-12 DIAGNOSIS — R100 Acute abdomen: Secondary | ICD-10-CM | POA: Diagnosis not present

## 2016-06-12 DIAGNOSIS — N179 Acute kidney failure, unspecified: Principal | ICD-10-CM | POA: Diagnosis present

## 2016-06-12 DIAGNOSIS — E860 Dehydration: Secondary | ICD-10-CM | POA: Diagnosis not present

## 2016-06-13 ENCOUNTER — Inpatient Hospital Stay (HOSPITAL_COMMUNITY): Payer: BLUE CROSS/BLUE SHIELD

## 2016-06-13 ENCOUNTER — Encounter (HOSPITAL_COMMUNITY): Payer: Self-pay | Admitting: *Deleted

## 2016-06-13 DIAGNOSIS — Z452 Encounter for adjustment and management of vascular access device: Secondary | ICD-10-CM

## 2016-06-13 DIAGNOSIS — D631 Anemia in chronic kidney disease: Secondary | ICD-10-CM | POA: Diagnosis present

## 2016-06-13 DIAGNOSIS — I10 Essential (primary) hypertension: Secondary | ICD-10-CM | POA: Diagnosis present

## 2016-06-13 DIAGNOSIS — N179 Acute kidney failure, unspecified: Principal | ICD-10-CM

## 2016-06-13 DIAGNOSIS — N189 Chronic kidney disease, unspecified: Secondary | ICD-10-CM | POA: Diagnosis present

## 2016-06-13 DIAGNOSIS — R7303 Prediabetes: Secondary | ICD-10-CM | POA: Diagnosis present

## 2016-06-13 DIAGNOSIS — N185 Chronic kidney disease, stage 5: Secondary | ICD-10-CM

## 2016-06-13 DIAGNOSIS — R112 Nausea with vomiting, unspecified: Secondary | ICD-10-CM | POA: Diagnosis present

## 2016-06-13 DIAGNOSIS — K219 Gastro-esophageal reflux disease without esophagitis: Secondary | ICD-10-CM | POA: Diagnosis present

## 2016-06-13 DIAGNOSIS — N390 Urinary tract infection, site not specified: Secondary | ICD-10-CM | POA: Diagnosis present

## 2016-06-13 LAB — COMPREHENSIVE METABOLIC PANEL
ALBUMIN: 2.3 g/dL — AB (ref 3.5–5.0)
ALT: 11 U/L — AB (ref 14–54)
AST: 11 U/L — AB (ref 15–41)
Alkaline Phosphatase: 97 U/L (ref 38–126)
Anion gap: 17 — ABNORMAL HIGH (ref 5–15)
BUN: 153 mg/dL — AB (ref 6–20)
CHLORIDE: 96 mmol/L — AB (ref 101–111)
CO2: 21 mmol/L — AB (ref 22–32)
CREATININE: 14.57 mg/dL — AB (ref 0.44–1.00)
Calcium: 7.4 mg/dL — ABNORMAL LOW (ref 8.9–10.3)
GFR calc non Af Amer: 2 mL/min — ABNORMAL LOW (ref >60)
GFR, EST AFRICAN AMERICAN: 3 mL/min — AB (ref >60)
GLUCOSE: 104 mg/dL — AB (ref 65–99)
Potassium: 3.2 mmol/L — ABNORMAL LOW (ref 3.5–5.1)
SODIUM: 134 mmol/L — AB (ref 135–145)
Total Bilirubin: 1.5 mg/dL — ABNORMAL HIGH (ref 0.3–1.2)
Total Protein: 5.8 g/dL — ABNORMAL LOW (ref 6.5–8.1)

## 2016-06-13 LAB — PREPARE RBC (CROSSMATCH)

## 2016-06-13 LAB — CBC WITH DIFFERENTIAL/PLATELET
BASOS ABS: 0.1 10*3/uL (ref 0.0–0.1)
BASOS PCT: 0 %
EOS ABS: 0.1 10*3/uL (ref 0.0–0.7)
EOS PCT: 1 %
HCT: 21.4 % — ABNORMAL LOW (ref 36.0–46.0)
HEMOGLOBIN: 6.9 g/dL — AB (ref 12.0–15.0)
Lymphocytes Relative: 8 %
Lymphs Abs: 1.5 10*3/uL (ref 0.7–4.0)
MCH: 30.5 pg (ref 26.0–34.0)
MCHC: 32.2 g/dL (ref 30.0–36.0)
MCV: 94.7 fL (ref 78.0–100.0)
Monocytes Absolute: 0.8 10*3/uL (ref 0.1–1.0)
Monocytes Relative: 4 %
NEUTROS PCT: 86 %
Neutro Abs: 15.9 10*3/uL — ABNORMAL HIGH (ref 1.7–7.7)
PLATELETS: 382 10*3/uL (ref 150–400)
RBC: 2.26 MIL/uL — AB (ref 3.87–5.11)
RDW: 15.8 % — ABNORMAL HIGH (ref 11.5–15.5)
WBC: 18.4 10*3/uL — ABNORMAL HIGH (ref 4.0–10.5)

## 2016-06-13 LAB — ABO/RH: ABO/RH(D): O POS

## 2016-06-13 LAB — GLUCOSE, CAPILLARY
GLUCOSE-CAPILLARY: 75 mg/dL (ref 65–99)
Glucose-Capillary: 103 mg/dL — ABNORMAL HIGH (ref 65–99)
Glucose-Capillary: 95 mg/dL (ref 65–99)
Glucose-Capillary: 74 mg/dL (ref 65–99)

## 2016-06-13 LAB — HEPATITIS B SURFACE ANTIGEN: Hepatitis B Surface Ag: NEGATIVE

## 2016-06-13 LAB — ALT: ALT: 10 U/L — AB (ref 14–54)

## 2016-06-13 MED ORDER — HYDROMORPHONE HCL 1 MG/ML IJ SOLN
1.0000 mg | Freq: Once | INTRAMUSCULAR | Status: AC
  Administered 2016-06-13: 0.5 mg via INTRAVENOUS
  Filled 2016-06-13: qty 1

## 2016-06-13 MED ORDER — ACETAMINOPHEN 325 MG PO TABS
650.0000 mg | ORAL_TABLET | Freq: Four times a day (QID) | ORAL | Status: DC | PRN
  Filled 2016-06-13 (×2): qty 2

## 2016-06-13 MED ORDER — SODIUM CHLORIDE 0.9 % IV SOLN
1250.0000 mg | Freq: Once | INTRAVENOUS | Status: AC
  Administered 2016-06-13: 1250 mg via INTRAVENOUS
  Filled 2016-06-13: qty 1250

## 2016-06-13 MED ORDER — PROMETHAZINE HCL 25 MG/ML IJ SOLN
12.5000 mg | Freq: Four times a day (QID) | INTRAMUSCULAR | Status: DC | PRN
  Administered 2016-06-13 – 2016-06-18 (×4): 12.5 mg via INTRAVENOUS
  Filled 2016-06-13 (×4): qty 1

## 2016-06-13 MED ORDER — PENTAFLUOROPROP-TETRAFLUOROETH EX AERO: 1.0000 "application " | INHALATION_SPRAY | CUTANEOUS | Status: DC | PRN

## 2016-06-13 MED ORDER — LIDOCAINE HCL (PF) 1 % IJ SOLN: 5.0000 mL | INTRAMUSCULAR | Status: DC | PRN

## 2016-06-13 MED ORDER — LIDOCAINE-PRILOCAINE 2.5-2.5 % EX CREA: 1.0000 "application " | TOPICAL_CREAM | CUTANEOUS | Status: DC | PRN

## 2016-06-13 MED ORDER — LABETALOL HCL 300 MG PO TABS
300.0000 mg | ORAL_TABLET | Freq: Two times a day (BID) | ORAL | Status: DC
  Administered 2016-06-14: 300 mg via ORAL
  Filled 2016-06-13 (×2): qty 1

## 2016-06-13 MED ORDER — FAMOTIDINE IN NACL 20-0.9 MG/50ML-% IV SOLN
20.0000 mg | INTRAVENOUS | Status: DC
  Administered 2016-06-13 – 2016-06-15 (×3): 20 mg via INTRAVENOUS
  Filled 2016-06-13 (×3): qty 50

## 2016-06-13 MED ORDER — DEXTROSE 5 % IV SOLN
1.0000 g | INTRAVENOUS | Status: DC
  Administered 2016-06-13: 1 g via INTRAVENOUS
  Filled 2016-06-13: qty 10

## 2016-06-13 MED ORDER — VANCOMYCIN HCL 500 MG IV SOLR
500.0000 mg | Freq: Once | INTRAVENOUS | Status: AC
  Administered 2016-06-13: 500 mg via INTRAVENOUS
  Filled 2016-06-13: qty 500

## 2016-06-13 MED ORDER — FAMOTIDINE IN NACL 20-0.9 MG/50ML-% IV SOLN
20.0000 mg | Freq: Two times a day (BID) | INTRAVENOUS | Status: DC
  Administered 2016-06-13: 20 mg via INTRAVENOUS
  Filled 2016-06-13 (×2): qty 50

## 2016-06-13 MED ORDER — INSULIN ASPART 100 UNIT/ML ~~LOC~~ SOLN
0.0000 [IU] | Freq: Three times a day (TID) | SUBCUTANEOUS | Status: DC
  Administered 2016-06-14: 1 [IU] via SUBCUTANEOUS
  Administered 2016-06-14: 2 [IU] via SUBCUTANEOUS
  Administered 2016-06-15: 1 [IU] via SUBCUTANEOUS
  Administered 2016-06-15: 2 [IU] via SUBCUTANEOUS
  Administered 2016-06-16 – 2016-06-17 (×2): 1 [IU] via SUBCUTANEOUS

## 2016-06-13 MED ORDER — AMLODIPINE BESYLATE 10 MG PO TABS
10.0000 mg | ORAL_TABLET | Freq: Every day | ORAL | Status: DC
  Administered 2016-06-15 – 2016-06-23 (×9): 10 mg via ORAL
  Filled 2016-06-13 (×9): qty 1

## 2016-06-13 MED ORDER — HEPARIN SODIUM (PORCINE) 1000 UNIT/ML DIALYSIS: 1000.0000 [IU] | INTRAMUSCULAR | Status: DC | PRN

## 2016-06-13 MED ORDER — ENOXAPARIN SODIUM 30 MG/0.3ML ~~LOC~~ SOLN
30.0000 mg | Freq: Every day | SUBCUTANEOUS | Status: DC
  Administered 2016-06-13 – 2016-06-15 (×3): 30 mg via SUBCUTANEOUS
  Filled 2016-06-13 (×3): qty 0.3

## 2016-06-13 MED ORDER — SODIUM CHLORIDE 0.9 % IV SOLN: Freq: Once | INTRAVENOUS | Status: DC

## 2016-06-13 MED ORDER — SODIUM CHLORIDE 0.9 % IV SOLN
INTRAVENOUS | Status: AC
  Administered 2016-06-13: 02:00:00 via INTRAVENOUS

## 2016-06-13 MED ORDER — DIPHENHYDRAMINE HCL 50 MG/ML IJ SOLN: 12.5000 mg | Freq: Four times a day (QID) | INTRAMUSCULAR | Status: DC | PRN

## 2016-06-13 MED ORDER — SODIUM CHLORIDE 0.9 % IV SOLN: 100.0000 mL | INTRAVENOUS | Status: DC | PRN

## 2016-06-13 MED ORDER — DEXTROSE 5 % IV SOLN
1.0000 g | Freq: Every day | INTRAVENOUS | Status: DC
  Administered 2016-06-13 – 2016-06-16 (×5): 1 g via INTRAVENOUS
  Filled 2016-06-13 (×6): qty 1

## 2016-06-13 MED ORDER — VANCOMYCIN HCL 500 MG IV SOLR: 500.0000 mg | INTRAVENOUS | Status: DC

## 2016-06-13 MED ORDER — HYDRALAZINE HCL 20 MG/ML IJ SOLN
10.0000 mg | Freq: Once | INTRAMUSCULAR | Status: AC
  Administered 2016-06-13: 10 mg via INTRAVENOUS
  Filled 2016-06-13: qty 1

## 2016-06-13 MED ORDER — ALTEPLASE 2 MG IJ SOLR
2.0000 mg | Freq: Once | INTRAMUSCULAR | Status: DC | PRN
Start: 2016-06-13 — End: 2016-06-13

## 2016-06-13 MED ORDER — CALCIUM CARBONATE ANTACID 500 MG PO CHEW
1.0000 | CHEWABLE_TABLET | Freq: Every day | ORAL | Status: DC
  Administered 2016-06-14 – 2016-06-22 (×8): 200 mg via ORAL
  Filled 2016-06-13 (×9): qty 1

## 2016-06-13 MED ORDER — ONDANSETRON HCL 4 MG/2ML IJ SOLN
4.0000 mg | Freq: Four times a day (QID) | INTRAMUSCULAR | Status: DC | PRN
  Administered 2016-06-13 (×2): 4 mg via INTRAVENOUS
  Filled 2016-06-13 (×2): qty 2

## 2016-06-13 NOTE — H&P (Signed)
History and Physical    Alison Baker EML:[id number] DOB: [birth year] DOA: 06/12/2016  PCP: Charletta Cousin, MD   Patient coming from: [facility] ED.  Chief Complaint: Nausea and vomiting.  HPI: Alison Baker is a 61 y.o. female with medical history significant of anemia renal disease, chronic renal disease, PUD, GERD, hypertension, prediabetes who is transferred from [facility] after presenting there with complaints of diarrhea, nausea and emesis for the past 3 weeks.  Per patient, about 3 weeks ago she was diagnosed with the flu. She was prescribed Tamiflu 75 mg a day, but was only able to take it 3 days as it induced nausea and emesis. Since then the patient has been having nausea most of the day, frequent emesis and diarrhea several times a day. She denies fever, chills, but feels very fatigued. She denies rhinorrhea, sore throat, but complains of occasional productive cough. She denies dyspnea, chest pain, palpitations, diaphoresis, but frequently feels lightheaded. She states that she was having edema over 3 weeks ago, but does not have any edema at this time. She denies abdominal pain, melena or hematochezia. She denies dysuria, hematuria or frequency.  ED Course at [facility]: Patient was given IV fluids and antiemetics. Her workup showed urinalysis shows 3+ bacteria, too numerous to count WBC, 3+ protein, 2+ leukocyte esterase and negative nitrite. WBC of 17.4, hemoglobin 7.6 and platelets of 453. Sodium 135, potassium 3.3, chloride 92, bicarbonate 23 mmol/L.  Imaging at [facility]: CT scan of the abdomen and pelvis shows minimal bilateral hydronephrosis without obstructing stone, but with more prominent left-sided perinephric stranding and fluid. There is wall thickening of both renal pelvises. Findings are concerning for bilateral pyelonephritis and left-sided ureteritis. The gallbladder is concerning for cystitis. Prominent retroperitoneal nodes. Please see CT scan report  on transfer package for further detail.  Review of Systems: As per HPI otherwise 10 point review of systems negative.    Past Medical History:  Diagnosis Date  . Anemia   . Chronic kidney disease   . Gastric ulcer with hemorrhage   . GERD (gastroesophageal reflux disease)   . Hypertension   . Pre-diabetes     Past Surgical History:  Procedure Laterality Date  . ABDOMINAL HYSTERECTOMY    . CHOLECYSTECTOMY OPEN    . TOTAL ABDOMINAL HYSTERECTOMY W/ BILATERAL SALPINGOOPHORECTOMY       reports that she quit smoking about 20 years ago. Her smoking use included Cigars. She has never used smokeless tobacco. She reports that she does not drink alcohol or use drugs.  Allergies  Allergen Reactions  . Strawberry Extract Swelling  . Nsaids Other (See Comments)    Has a healing stomach ulcer  . Amoxicillin Rash  . Clonidine Nausea And Vomiting  . Penicillins Rash    Family History  Problem Relation Age of Onset  . Hypertension Mother     Prior to Admission medications   Medication Sig Start Date End Date Taking? Authorizing Provider  acetaminophen (TYLENOL) 325 MG tablet Take 650 mg by mouth every 6 (six) hours as needed.    Historical Provider, MD  amLODipine (NORVASC) 10 MG tablet Take 10 mg by mouth. 09/09/15 09/08/16  Historical Provider, MD  calcium carbonate (TUMS - DOSED IN MG ELEMENTAL CALCIUM) 500 MG chewable tablet Chew 1 tablet by mouth daily.    Historical Provider, MD  epoetin alfa (PROCRIT) 71219 UNIT/ML injection Inject 20,000 Units into the skin every 14 (fourteen) days.    Historical Provider, MD  labetalol (NORMODYNE) 300 MG  tablet Take 300 mg by mouth. 09/09/15   Historical Provider, MD  torsemide (DEMADEX) 20 MG tablet Take 40 mg by mouth once. 09/09/15   Historical Provider, MD    Physical Exam:  Constitutional: Looks chronically ill, frail. Vitals:   06/12/16 2356  BP: (!) 177/57  Pulse: 79  Resp: 16  Temp: 99.1 F (37.3 C)  SpO2: 98%  Weight: 67 kg  (147 lb 11.3 oz)  Height: 5\' 3"  (1.6 m)   Eyes: PERRL, lids and conjunctivae normal ENMT: Mucous membranes and lips aredry. Posterior pharynx clear of any exudate or lesions Neck: normal, supple, no masses, no thyromegaly Respiratory: clear to auscultation bilaterally, no wheezing, no crackles. Normal respiratory effort. No accessory muscle use.  Cardiovascular: Regular rate and rhythm, positive systolic murmur, no rubs / gallops. No extremity edema. 2+ pedal pulses. No carotid bruits.  Abdomen: Surgical scar. Abdominal wall hernia, Soft, mild left CVA tenderness, no masses palpated. No hepatosplenomegaly. Bowel sounds positive.  Musculoskeletal: no clubbing / cyanosis. Good ROM, no contractures. Normal muscle tone.  Skin: Small ecchymotic areas on his treatment. Neurologic: CN 2-12 grossly intact. Sensation intact, DTR normal. Generalized weakness.  Psychiatric: Normal judgment and insight. Alert and oriented x 3. Depressed mood.    Labs on Admission: I have personally reviewed following labs and imaging studies  CBC: No results for input(s): WBC, NEUTROABS, HGB, HCT, MCV, PLT in the last 168 hours. Basic Metabolic Panel: No results for input(s): NA, K, CL, CO2, GLUCOSE, BUN, CREATININE, CALCIUM, MG, PHOS in the last 168 hours. GFR: CrCl cannot be calculated (No order found.). Liver Function Tests: No results for input(s): AST, ALT, ALKPHOS, BILITOT, PROT, ALBUMIN in the last 168 hours. No results for input(s): LIPASE, AMYLASE in the last 168 hours. No results for input(s): AMMONIA in the last 168 hours. Coagulation Profile: No results for input(s): INR, PROTIME in the last 168 hours. Cardiac Enzymes: No results for input(s): CKTOTAL, CKMB, CKMBINDEX, TROPONINI in the last 168 hours. BNP (last 3 results) No results for input(s): PROBNP in the last 8760 hours. HbA1C: No results for input(s): HGBA1C in the last 72 hours. CBG: No results for input(s): GLUCAP in the last 168  hours. Lipid Profile: No results for input(s): CHOL, HDL, LDLCALC, TRIG, CHOLHDL, LDLDIRECT in the last 72 hours. Thyroid Function Tests: No results for input(s): TSH, T4TOTAL, FREET4, T3FREE, THYROIDAB in the last 72 hours. Anemia Panel: No results for input(s): VITAMINB12, FOLATE, FERRITIN, TIBC, IRON, RETICCTPCT in the last 72 hours. Urine analysis: No results found for: COLORURINE, APPEARANCEUR, LABSPEC, PHURINE, GLUCOSEU, HGBUR, BILIRUBINUR, KETONESUR, PROTEINUR, UROBILINOGEN, NITRITE, LEUKOCYTESUR   Radiological Exams on Admission: No results found.  EKG: Independently reviewed. Ordered  Assessment/Plan Principal Problem:   Acute on chronic kidney failure ([place]) Admit to telemetry/inpatient. She received a 1 L normal saline bolus at [facility] emergency department. Time limited IV hydration and 100 mL/hour 10 hours. Vancomycin and cefepime per pharmacy. Follow cultures and sensitivity from [facility]. Left message for nephrology to evaluate, although they may already be aware..  Active Problems:   UTI (urinary tract infection) Per CT scan, the patient has pyelonephritis, left ureteritis and cystitis. Possible perirenal abscess. Repeat imaging suggested. Please follow UC&S from [place].    Essential hypertension Continue amlodipine and labetalol. Monitor blood pressure.    Anemia of renal disease Monitor hematocrit and hemoglobin. Transfuse as needed. Erythropoietin per nephrology.    GERD (gastroesophageal reflux disease) Famotidine 20 mg IV PB every 12 hours.    Pre-diabetes  Carbohydrate modified diet. CBG monitoring with regular insulin sliding scale.      Nausea and vomiting Antiemetics as needed.    DVT prophylaxis: Lovenox SQ. Code Status: Full code. Family Communication: Her spouse is present in the room. Disposition Plan: Admit for IV antibiotic therapy and nephrology consult. Consults called: Left message for nephrology. Admission status:  Inpatient/telemetry.   Reubin Milan MD [facility] Pager [phone].  If 7PM-7AM, please contact night-coverage [email or web address] Password [facility]  06/13/2016, 2:31 AM

## 2016-06-13 NOTE — Progress Notes (Signed)
Pharmacy Antibiotic Note  Alison Baker is a 61 y.o. 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  Pharmacy has been consulted for Vancomycin and Cefepime  Dosing.  Patient was loaded with vancomycin 1250mg  IV once this morning and underwent 2.5 hours of dialysis with BFR of 250. Plan: Vancomycin 500mg  IV qHD Cefepime 1 g IV q24h Monitor culture data, HD plans and clinical course VT at [facility] prn   Height: 5\' 3"  (160 cm) Weight: 151 lb 0.2 oz (68.5 kg) IBW/kg (Calculated) : 52.4  Temp (24hrs), Avg:98.7 F (37.1 C), Min:98.1 F (36.7 C), Max:99.9 F (37.7 C)  Labs (at [facility]): WBC  17.4 Hgb  7.6 Ct  23.2 Plt  453  Scr 14.0   Recent Labs Lab 06/13/16 0622  WBC 18.4*  CREATININE 14.57*    Estimated Creatinine Clearance: 3.9 mL/min (by C-G formula based on SCr of 14.57 mg/dL (H)).    Allergies  Allergen Reactions  . Strawberry Extract Swelling  . Nsaids Other (See Comments)    Has a healing stomach ulcer  . Amoxicillin Rash  . Clonidine Nausea And Vomiting  . Penicillins Rash    Andrey Cota. Diona Foley, PharmD, BCPS Clinical Pharmacist [phone] 06/13/2016 7:44 PM

## 2016-06-13 NOTE — Procedures (Signed)
I was present at this dialysis session. I have reviewed the session itself and made appropriate changes.   Filed Weights   06/12/16 2356  Weight: 67 kg (147 lb 11.3 oz)     Recent Labs Lab 06/13/16 0622  NA 134*  K 3.2*  CL 96*  CO2 21*  GLUCOSE 104*  BUN 153*  CREATININE 14.57*  CALCIUM 7.4*     Recent Labs Lab 06/13/16 0622  WBC 18.4*  NEUTROABS 15.9*  HGB 6.9*  HCT 21.4*  MCV 94.7  PLT 382    Scheduled Meds: . sodium chloride   Intravenous Once  . amLODipine  10 mg Oral Daily  . calcium carbonate  1 tablet Oral QAC supper  . ceFEPime (MAXIPIME) IV  1 g Intravenous QHS  . enoxaparin (LOVENOX) injection  30 mg Subcutaneous Daily  . famotidine (PEPCID) IV  20 mg Intravenous Q24H  . insulin aspart  0-9 Units Subcutaneous TID WC  . labetalol  300 mg Oral BID   Continuous Infusions: PRN Meds:.acetaminophen, diphenhydrAMINE, promethazine   Pearson Grippe  MD 06/13/2016, 4:18 PM

## 2016-06-13 NOTE — Progress Notes (Signed)
Nephrologist was notified to clarify if the pt can get  2U of PRBC, MD said just give 2U because of the short time pt will be on HD tx.

## 2016-06-13 NOTE — Progress Notes (Signed)
Pharmacy Antibiotic Note  Alison Baker is a 61 y.o. 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  Pharmacy has been consulted for Vancomycin and Cefepime  dosing.  Plan: Vancomycin 1250 mg IV now Cefepime 1 g IV q24h F/U renal function/vancommycin levels and redose as needed   Height: 5\' 3"  (160 cm) Weight: 147 lb 11.3 oz (67 kg) IBW/kg (Calculated) : 52.4  Temp (24hrs), Avg:99.1 F (37.3 C), Min:99.1 F (37.3 C), Max:99.1 F (37.3 C)  Labs (at [facility]): WBC  17.4 Hgb  7.6 Ct  23.2 Plt  453  Scr 14.0  No results for input(s): WBC, CREATININE, LATICACIDVEN, VANCOTROUGH, VANCOPEAK, VANCORANDOM, GENTTROUGH, GENTPEAK, GENTRANDOM, TOBRATROUGH, TOBRAPEAK, TOBRARND, AMIKACINPEAK, AMIKACINTROU, AMIKACIN in the last 168 hours.  CrCl cannot be calculated (No order found.).    Allergies  Allergen Reactions  . Strawberry Extract Swelling  . Nsaids Other (See Comments)    Has a healing stomach ulcer  . Amoxicillin Rash  . Clonidine Nausea And Vomiting  . Penicillins Rash    [name] 06/13/2016 3:05 AM

## 2016-06-13 NOTE — Progress Notes (Addendum)
Subjective: Patient admitted this morning, see detailed H&P by Dr Olevia Bowens 61 y.o. female with medical history significant of anemia renal disease, chronic renal disease, PUD, GERD, hypertension, prediabetes who is transferred from [facility] after presenting there with complaints of diarrhea, nausea and emesis for the past 3 weeks.  Per patient, about 3 weeks ago she was diagnosed with the flu. She was prescribed Tamiflu 75 mg a day, but was only able to take it 3 days as it induced nausea and emesis. Since then the patient has been having nausea most of the day, frequent emesis and diarrhea several times a day. She denies fever, chills, but feels very fatigued. She denies rhinorrhea, sore throat, but complains of occasional productive cough. She denies dyspnea, chest pain, palpitations, diaphoresis, but frequently feels lightheaded. She states that she was having edema over 3 weeks ago, but does not have any edema at this time. She denies abdominal pain, melena or hematochezia. She denies dysuria, hematuria or frequency.  ED Course at [facility]: Patient was given IV fluids and antiemetics. Her workup showed urinalysis shows 3+ bacteria, too numerous to count WBC, 3+ protein, 2+ leukocyte esterase and negative nitrite. WBC of 17.4, hemoglobin 7.6 and platelets of 453. Sodium 135, potassium 3.3, chloride 92, bicarbonate 23 mmol/L.  Imaging at [facility]: CT scan of the abdomen and pelvis shows minimal bilateral hydronephrosis without obstructing stone, but with more prominent left-sided perinephric stranding and fluid. There is wall thickening of both renal pelvises. Findings are concerning for bilateral pyelonephritis and left-sided ureteritis. The gallbladder is concerning for cystitis. Prominent retroperitoneal nodes. Please see CT scan report on transfer package for further detail.  This morning patient continues to have nausea and vomiting  Vitals:   06/13/16 1727 06/13/16 1730  BP:  (!) 159/81 (!) 170/82  Pulse: 77 76  Resp: (!) 21   Temp: 98.4 F (36.9 C)       A/P Peri renal abscess/pyelonephritis New ESRD Intractable nausea and vomiting Hypertension GERD  Continue cefepime, vancomycin. Nephrology consulted Follow urine culture results from [facility] 12.5 mg IV every 6 hours when necessary for nausea and vomiting Continue labetalol 300 mg twice a day, amlodipine 10 mg by mouth daily    [place] [facility] Pager- [phone]

## 2016-06-13 NOTE — Procedures (Signed)
Hemodialysis Catheter Insertion Procedure Note Alison Baker [id number] 05-27-1955  Procedure: Insertion of Hemodialysis Catheter Indications: Hemodialysis  Procedure Details Consent: Risks of procedure as well as the alternatives and risks of each were explained to the (patient/caregiver).  Consent for procedure obtained.  Time Out: Verified patient identification, verified procedure, site/side was marked, verified correct patient position, special equipment/implants available, medications/allergies/relevent history reviewed, required imaging and test results available.  Performed  Maximum sterile technique was used including antiseptics, cap, gloves, gown, hand hygiene, mask and sheet.  Skin prep: Chlorhexidine; local anesthetic administered  A Trialysis HD catheter was placed in the right internal jugular vein using the Seldinger technique.  Evaluation Blood flow good Complications: No apparent complications Patient did tolerate procedure well. Chest X-ray ordered to verify placement.  CXR: pending.    Hayden Pedro, AG-ACNP [facility]  Pgr: [phone]  PCCM Pgr: [phone]

## 2016-06-13 NOTE — Consult Note (Addendum)
Alison Baker Admit Date: 06/12/2016 06/13/2016 Rexene Agent Requesting Physician:  Darrick Meigs MD  Reason for Consult:  N/V/D, AoCKD5, Pyelonephritis HPI:  42F seen at request of Dr. Darrick Meigs for eval of above issues.  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  Presumed HTN/DM related.  Plan was to start PD in [place], PD Surgery was scheduled for early March. Baseline SCr is around 4.  HTN on torsemide, amlodpine, PUD  Hx/o PUD/GERD  Anemia of CKD on procrit 20k q2wk  Hx/o cholecystectomy  Had URI/Flu 3wk ago and started tamiflu. Since has had progressive N/V/D.  Having L sided flank discomfort. Denies F/C/dysuria.    Lives in [place]. Presented to [facility] ED yesterday with above Sx.  BUN 153, SCr 14, K 3.3, HCO3 23.  CT A/P suggested pyelonephritis.  UA with WBC, protein.    Admitted to [facility].  Hydrating with NS @ 131mL / hr.  On Vanc/Cefepime.    Pt admits to malaise/faigue, GI issues as above. At least 13lb weight loss.    She is willing to start HD.       Creatinine, Ser (mg/dL)  Date Value  06/13/2016 14.57 (H)  ] I/Os:  ROS Balance of 12 systems is negative w/ exceptions as above  PMH  Past Medical History:  Diagnosis Date  . Anemia   . Chronic kidney disease   . Gastric ulcer with hemorrhage   . GERD (gastroesophageal reflux disease)   . Hypertension   . Pre-diabetes    PSH  Past Surgical History:  Procedure Laterality Date  . ABDOMINAL HYSTERECTOMY    . CHOLECYSTECTOMY OPEN    . TOTAL ABDOMINAL HYSTERECTOMY W/ BILATERAL SALPINGOOPHORECTOMY     FH  Family History  Problem Relation Age of Onset  . Hypertension Mother    SH  reports that she quit smoking about 20 years ago. Her smoking use included Cigars. She has never used smokeless tobacco. She reports that she does not drink alcohol or use drugs. Allergies  Allergies  Allergen Reactions  . Strawberry Extract Swelling  . Nsaids Other (See Comments)    Has a healing stomach ulcer  .  Amoxicillin Rash  . Clonidine Nausea And Vomiting  . Penicillins Rash   Home medications Prior to Admission medications   Medication Sig Start Date End Date Taking? Authorizing Provider  acetaminophen (TYLENOL) 325 MG tablet Take 650 mg by mouth every 6 (six) hours as needed.   Yes Historical Provider, MD  amLODipine (NORVASC) 10 MG tablet Take 10 mg by mouth. 09/09/15 09/08/16 Yes Historical Provider, MD  calcium carbonate (TUMS - DOSED IN MG ELEMENTAL CALCIUM) 500 MG chewable tablet Chew 1 tablet by mouth daily.   Yes Historical Provider, MD  labetalol (NORMODYNE) 300 MG tablet Take 300 mg by mouth. 09/09/15  Yes Historical Provider, MD  torsemide (DEMADEX) 20 MG tablet Take 40 mg by mouth once. 09/09/15  Yes Historical Provider, MD    Current Medications Scheduled Meds: . amLODipine  10 mg Oral Daily  . calcium carbonate  1 tablet Oral QAC supper  . ceFEPime (MAXIPIME) IV  1 g Intravenous QHS  . enoxaparin (LOVENOX) injection  30 mg Subcutaneous Daily  . famotidine (PEPCID) IV  20 mg Intravenous Q24H  . insulin aspart  0-9 Units Subcutaneous TID WC  . labetalol  300 mg Oral BID   Continuous Infusions: . sodium chloride 100 mL/hr at 06/13/16 0459   PRN Meds:.acetaminophen, diphenhydrAMINE, ondansetron (ZOFRAN) IV  CBC  Recent Labs Lab  06/13/16 0622  WBC 18.4*  NEUTROABS 15.9*  HGB 6.9*  HCT 21.4*  MCV 94.7  PLT 001   Basic Metabolic Panel  Recent Labs Lab 06/13/16 0622  NA 134*  K 3.2*  CL 96*  CO2 21*  GLUCOSE 104*  BUN 153*  CREATININE 14.57*  CALCIUM 7.4*    Physical Exam  Blood pressure (!) 150/62, pulse 88, temperature 98.5 F (36.9 C), temperature source Oral, resp. rate 12, height 5\' 3"  (1.6 m), weight 67 kg (147 lb 11.3 oz), SpO2 100 %. GEN: NAD, ill appearing ENT: NCAT EYES: EOMI  CV: RRR no rub, nl s1s2 PULM: ctab ABD: s/nt/nd SKIN: no rashes/lesions EXT:no LEE  NEURO: trace asterixus present   Assessment 89F with AoCKD5, now uremia and  ESRD, pyelonephritis, likely mild hypovolemia  1. New ESRD, uremic 1. Outpt plan was actively pursuing PD 2. Will need to start with HD first 2. Pyelonephritis on Vanc / Cefepime with TRH  3. Leukocytosis 2/2 #2 4. Hypovolemia on NS currently 5. HTN on CCB and MTP 6. Anemia on ESA as outpt 7. Hypokalemia, likely 2/2 poor PO intake  Plan 1. Start HD Today: 4K, 2.5 Ca, No heparin, No UF, HD Cath 2. CCM to assist with HD cath placement 3. CLIP pt 4. Will need VVS eval once infection issues resolving 5. PD can be returned to once health iimproved as outpt 6. Cont ABX, await cultures (done at [place]?) 7. OK to continue hydration at this time for antoher 12-24h 8. HD #2 tentative tomorrow   Pearson Grippe MD [phone] pgr 06/13/2016, 11:23 AM

## 2016-06-14 ENCOUNTER — Inpatient Hospital Stay (HOSPITAL_COMMUNITY): Payer: BLUE CROSS/BLUE SHIELD

## 2016-06-14 DIAGNOSIS — N12 Tubulo-interstitial nephritis, not specified as acute or chronic: Secondary | ICD-10-CM | POA: Diagnosis present

## 2016-06-14 LAB — BASIC METABOLIC PANEL
ANION GAP: 14 (ref 5–15)
BUN: 68 mg/dL — ABNORMAL HIGH (ref 6–20)
CALCIUM: 7.3 mg/dL — AB (ref 8.9–10.3)
CO2: 22 mmol/L (ref 22–32)
Chloride: 98 mmol/L — ABNORMAL LOW (ref 101–111)
Creatinine, Ser: 8.75 mg/dL — ABNORMAL HIGH (ref 0.44–1.00)
GFR, EST AFRICAN AMERICAN: 5 mL/min — AB (ref >60)
GFR, EST NON AFRICAN AMERICAN: 4 mL/min — AB (ref >60)
GLUCOSE: 192 mg/dL — AB (ref 65–99)
POTASSIUM: 3.6 mmol/L (ref 3.5–5.1)
Sodium: 134 mmol/L — ABNORMAL LOW (ref 135–145)

## 2016-06-14 LAB — HEPATITIS B CORE ANTIBODY, IGM: HEP B C IGM: NEGATIVE

## 2016-06-14 LAB — CBC WITH DIFFERENTIAL/PLATELET
BASOS ABS: 0.1 10*3/uL (ref 0.0–0.1)
BASOS PCT: 0 %
Eosinophils Absolute: 0.1 10*3/uL (ref 0.0–0.7)
Eosinophils Relative: 0 %
HEMATOCRIT: 25.5 % — AB (ref 36.0–46.0)
Hemoglobin: 8.2 g/dL — ABNORMAL LOW (ref 12.0–15.0)
LYMPHS PCT: 5 %
Lymphs Abs: 1 10*3/uL (ref 0.7–4.0)
MCH: 30.1 pg (ref 26.0–34.0)
MCHC: 32.2 g/dL (ref 30.0–36.0)
MCV: 93.8 fL (ref 78.0–100.0)
Monocytes Absolute: 1 10*3/uL (ref 0.1–1.0)
Monocytes Relative: 5 %
NEUTROS PCT: 90 %
Neutro Abs: 19.2 10*3/uL — ABNORMAL HIGH (ref 1.7–7.7)
Platelets: 311 10*3/uL (ref 150–400)
RBC: 2.72 MIL/uL — AB (ref 3.87–5.11)
RDW: 17.5 % — ABNORMAL HIGH (ref 11.5–15.5)
WBC: 21.4 10*3/uL — AB (ref 4.0–10.5)

## 2016-06-14 LAB — HEPATITIS B SURFACE ANTIBODY,QUALITATIVE: HEP B S AB: NONREACTIVE

## 2016-06-14 LAB — GLUCOSE, CAPILLARY
GLUCOSE-CAPILLARY: 109 mg/dL — AB (ref 65–99)
Glucose-Capillary: 76 mg/dL (ref 65–99)
Glucose-Capillary: 75 mg/dL (ref 65–99)
Glucose-Capillary: 130 mg/dL — ABNORMAL HIGH (ref 65–99)
Glucose-Capillary: 199 mg/dL — ABNORMAL HIGH (ref 65–99)

## 2016-06-14 MED ORDER — SODIUM CHLORIDE 0.9% FLUSH
10.0000 mL | INTRAVENOUS | Status: DC | PRN
  Administered 2016-06-17 – 2016-06-18 (×3): 10 mL
  Administered 2016-06-19: 20 mL
  Administered 2016-06-19: 10 mL
  Filled 2016-06-14 (×5): qty 40

## 2016-06-14 NOTE — Progress Notes (Signed)
Foley catheter removed per MD order.  Patient tolerated well.  

## 2016-06-14 NOTE — Progress Notes (Signed)
PROGRESS NOTE    Alison Baker  NLZ:[id number] DOB: August 14, 1955 DOA: 06/12/2016 PCP: Charletta Cousin, MD   Brief Narrative: 61 y.o. female with PMH of anemia renal disease, chronic renal disease, PUD, GERD, hypertension, prediabetes who is transferred from [facility] after presenting there with complaints of diarrhea, nausea and emesis for the past 3 weeks.  Per patient, about 3 weeks ago she was diagnosed with the flu. She was prescribed Tamiflu 75 mg a day, but was only able to take it 3 days as it induced nausea and emesis. Since then the patient has been having nausea most of the day, frequent emesis and diarrhea several times a day. She denies fever, chills, but feels very fatigued.  Urinalysis shows 3+ bacteria, too numerous to count WBC, 3+ protein, 2+ leukocyte esterase and negative nitrite. WBC of 17.4, hemoglobin 7.6 and platelets of 453. Sodium 135, potassium 3.3, chloride 92, bicarbonate 23 mmol/L.  Imaging at [facility]: CT scan of the abdomen and pelvis shows minimal bilateral hydronephrosis without obstructing stone, but with more prominent left-sided perinephric stranding and fluid. There is wall thickening of both renal pelvises. Findings are concerning for bilateral pyelonephritis and left-sided ureteritis. The gallbladder is concerning for cystitis. Prominent retroperitoneal nodes. Please see CT scan report on transfer package for further detail.  Assessment & Plan:   Principal Problem:   Acute on chronic kidney failure (HCC) Active Problems:   Essential hypertension   Anemia of renal disease   GERD (gastroesophageal reflux disease)   Pre-diabetes   UTI (urinary tract infection)   Nausea and vomiting   Acute on chronic kidney failure ([place])- with uremia, ESRD on HD. HD #1- 06/13/16. - On Vancomycin per pharm- 2/16>> 2/17 - cefepime per pharmacy 2/16 >> - [facility], no blood cultures . Urine cultures will be faxed     Pyelonephritis-  Per CT  scan, the patient has pyelonephritis, left ureteritis and cystitis. Possible perirenal abscess. Repeat imaging suggested. - Renal ultrasound 2/17- No definite mass identified about the left kidney to correspond with previously described perinephric mass on prior CT. CT or MRI with contrast recommended - With patient's renal status CT or MRI with contrast would be contraindicated. - We will DC vancomycin, continue cefepime - Called urology, for abscess, recommended if abscess is increasing in size on imaging would benefit from IR consult.    Essential hypertension- uncontrolled, likely related to worsening renal insufficiency. Continue amlodipine and labetalol. Monitor blood pressure.    Anemia of renal disease- Hgb 6.9,  Transfused with HD  Erythropoietin per nephrology.    GERD (gastroesophageal reflux disease) Famotidine 20 mg IV PB every 12 hours.    Pre-diabetes Carbohydrate modified diet. CBG monitoring with regular insulin sliding scale.      Nausea and vomiting Antiemetics as needed.  Murmur- tricuspid area loudest, 3/6. Per patient no history of murmur. ?Flow murmur - Echo  DVT prophylaxis: Lovenox SQ. Code Status: Full code. Family Communication: Her spouse is present in the room. Disposition Plan: Admit for IV antibiotic therapy and nephrology consult. Consults called: Left message for nephrology. Admission status: Inpatient/telemetry.   Consultants:   Nephrology  Procedures:  - Renal ultrasound- Mild bilateral hydronephrosis.  No definite mass identified about the left kidney to correspond with previously described perinephric mass on prior CT. As previously recommended, when patient clinically able, recommend correlation with pre and post contrast-enhanced CT or MRI.   Antimicrobials:  - Vanc- 2/16 >> 2/17 - Cefepime- 2/16 >>  Subjective: Complaints  of mild left flank pain. Nausea but no vomiting. Denies frequency had only one episode of dysuria.  No fever or chills.  Objective: Vitals:   06/13/16 2208 06/14/16 0115 06/14/16 0511 06/14/16 0924  BP: (!) 186/72 (!) 164/86 (!) 167/76 (!) 127/106  Pulse: 80 90 80 85  Resp:   16 18  Temp:  (!) 100.4 F (38 C) 98.9 F (37.2 C) 99.5 F (37.5 C)  TempSrc:  Axillary Axillary Oral  SpO2:  99% 100% 97%  Weight:      Height:        Intake/Output Summary (Last 24 hours) at 06/14/16 1506 Last data filed at 06/14/16 1444  Gross per 24 hour  Intake             1035 ml  Output              605 ml  Net              430 ml   Filed Weights   06/13/16 1600 06/13/16 1839 06/13/16 2119  Weight: 68.4 kg (150 lb 12.7 oz) 68.5 kg (151 lb 0.2 oz) 69.1 kg (152 lb 5.4 oz)    Examination:  General exam: Appears calm and comfortable  Respiratory system: Clear to auscultation. Respiratory effort normal. Cardiovascular system: S1 & S2 heard, RRR. 3/6 systolic murmur loudest in tricuspid area. No pedal edema. Gastrointestinal system: Abdomen is nondistended, soft and nontender. No organomegaly or masses felt. Normal bowel sounds heard. No CVA tenderness Central nervous system: Alert and oriented. No focal neurological deficits. Extremities: Symmetric 5 x 5 power. Skin: No rashes, lesions or ulcers Psychiatry: Judgement and insight appear normal. Mood & affect appropriate.   Data Reviewed: I have personally reviewed following labs and imaging studies  CBC:  Recent Labs Lab 06/13/16 0622  WBC 18.4*  NEUTROABS 15.9*  HGB 6.9*  HCT 21.4*  MCV 94.7  PLT 017   Basic Metabolic Panel:  Recent Labs Lab 06/13/16 0622  NA 134*  K 3.2*  CL 96*  CO2 21*  GLUCOSE 104*  BUN 153*  CREATININE 14.57*  CALCIUM 7.4*   Liver Function Tests:  Recent Labs Lab 06/13/16 0622 06/13/16 1638  AST 11*  --   ALT 11* 10*  ALKPHOS 97  --   BILITOT 1.5*  --   PROT 5.8*  --   ALBUMIN 2.3*  --    CBG:  Recent Labs Lab 06/13/16 1823 06/13/16 1933 06/14/16 0634 06/14/16 0742 06/14/16 1157    GLUCAP 75 74 76 75 130*    Radiology Studies: US Renal  Result Date: 06/14/2016 CLINICAL DATA:  Patient with bilateral pyelonephritis. Evolving left perinephric abscess on CT. EXAM: RENAL / URINARY TRACT ULTRASOUND COMPLETE COMPARISON:  CT abdomen pelvis 06/12/2016. FINDINGS: Right Kidney: Length: 10.4 cm. Normal renal cortical thickness echogenicity. Mild hydronephrosis. Left Kidney: Length: 10.9 cm. Normal renal cortical thickness and echogenicity. Mild hydronephrosis. No definite pararenal mass identified. Bladder: Decompressed with Foley catheter. IMPRESSION: Mild bilateral hydronephrosis. No definite mass identified about the left kidney to correspond with previously described perinephric mass on prior CT. As previously recommended, when patient clinically able, recommend correlation with pre and post contrast-enhanced CT or MRI. Electronically Signed   By: Lovey Newcomer M.D.   On: 06/14/2016 14:45   Dg Chest Port 1 View  Result Date: 06/13/2016 CLINICAL DATA:  Central catheter placement EXAM: PORTABLE CHEST 1 VIEW COMPARISON:  None. FINDINGS: Central catheter tip is in the superior vena cava. No pneumothorax.  There is no edema or consolidation. There is slight atelectasis in the lateral left base. Heart is upper normal in size with pulmonary vascularity within normal limits. No adenopathy. No bone lesions. IMPRESSION: Central catheter tip in superior vena cava. No pneumothorax. Slight left base atelectasis. Lungs elsewhere clear. Electronically Signed   By: Lowella Grip III M.D.   On: 06/13/2016 15:48    Scheduled Meds: . sodium chloride   Intravenous Once  . sodium chloride   Intravenous Once  . amLODipine  10 mg Oral Daily  . calcium carbonate  1 tablet Oral QAC supper  . ceFEPime (MAXIPIME) IV  1 g Intravenous QHS  . enoxaparin (LOVENOX) injection  30 mg Subcutaneous Daily  . famotidine (PEPCID) IV  20 mg Intravenous Q24H  . insulin aspart  0-9 Units Subcutaneous TID WC  .  labetalol  300 mg Oral BID  . [START ON 06/16/2016] vancomycin  500 mg Intravenous Q M,W,F-HD   Continuous Infusions:   LOS: 2 days   Bethena Roys, MD [facility] Pager [phone]  If 7PM-7AM, please contact night-coverage [email or web address] Password [facility] : [facility] 06/14/2016, 3:06 PM

## 2016-06-14 NOTE — Progress Notes (Signed)
Admit: 06/12/2016 LOS: 2  25F with AoCKD5, now uremia and ESRD, pyelonephritis, likely mild hypovolemia  Subjective:  Nontunneled HD cath and HD#1 yesterday, tol well Feels improved, more awake this AM Having N, but hasn't eating Good UOP Transfused 1u PRBC with HD yesterday Low grade fevers this AM; on vanc/cefepime; Cx data is from [facility]   02/16 0701 - 02/17 0700 In: 435 [Blood:335; IV Piggyback:100] Out: 0786 [Urine:1370]  Filed Weights   06/13/16 1600 06/13/16 1839 06/13/16 2119  Weight: 68.4 kg (150 lb 12.7 oz) 68.5 kg (151 lb 0.2 oz) 69.1 kg (152 lb 5.4 oz)    Scheduled Meds: . sodium chloride   Intravenous Once  . sodium chloride   Intravenous Once  . amLODipine  10 mg Oral Daily  . calcium carbonate  1 tablet Oral QAC supper  . ceFEPime (MAXIPIME) IV  1 g Intravenous QHS  . enoxaparin (LOVENOX) injection  30 mg Subcutaneous Daily  . famotidine (PEPCID) IV  20 mg Intravenous Q24H  . insulin aspart  0-9 Units Subcutaneous TID WC  . labetalol  300 mg Oral BID  . [START ON 06/16/2016] vancomycin  500 mg Intravenous Q M,W,F-HD   Continuous Infusions: PRN Meds:.acetaminophen, diphenhydrAMINE, promethazine  Current Labs: reviewed    Physical Exam:  Blood pressure (!) 127/106, pulse 85, temperature 99.5 F (37.5 C), temperature source Oral, resp. rate 18, height 5\' 3"  (1.6 m), weight 69.1 kg (152 lb 5.4 oz), SpO2 97 %. NAD, more awake and alert RRR CTAB No LEE S/ND/ND R IJ Temp HD cath bandaged Asterixus resolved NCAT EOMI  A 1. New ESRD, uremic 1. Outpt plan was actively pursuing PD 2. Started HD 06/13/16 3. Temp R IJ HD Cath CCM placed 2/16 2. Pyelonephritis on Vanc / Cefepime with TRH  3. Leukocytosis 2/2 #2 4. Hypovolemia on NS currently 5. HTN on CCB and MTP 6. Anemia on ESA as outpt 7. Hypokalemia, likely 2/2 poor PO intake  P 1. HD #2 today 2. Once ID issues resolved (fevers, WBC, Cx data) will need TDC and eval for permanent access 3. ABX per  TRH, await Cx data to narrow 4. Labs at HD this AM 5. Ordered regular diet for pt 6. CLIP in process 7. HD #3 2/19   Pearson Grippe MD 06/14/2016, 9:40 AM   Recent Labs Lab 06/13/16 0622  NA 134*  K 3.2*  CL 96*  CO2 21*  GLUCOSE 104*  BUN 153*  CREATININE 14.57*  CALCIUM 7.4*    Recent Labs Lab 06/13/16 0622  WBC 18.4*  NEUTROABS 15.9*  HGB 6.9*  HCT 21.4*  MCV 94.7  PLT 382

## 2016-06-15 ENCOUNTER — Encounter (HOSPITAL_COMMUNITY): Payer: Self-pay | Admitting: *Deleted

## 2016-06-15 ENCOUNTER — Inpatient Hospital Stay (HOSPITAL_COMMUNITY): Payer: BLUE CROSS/BLUE SHIELD

## 2016-06-15 DIAGNOSIS — N151 Renal and perinephric abscess: Secondary | ICD-10-CM | POA: Diagnosis present

## 2016-06-15 DIAGNOSIS — R011 Cardiac murmur, unspecified: Secondary | ICD-10-CM

## 2016-06-15 LAB — ECHOCARDIOGRAM COMPLETE
E decel time: 232 msec
EERAT: 17.05
FS: 30 % (ref 28–44)
HEIGHTINCHES: 63 in
IVS/LV PW RATIO, ED: 0.77
LA ID, A-P, ES: 44 mm
LA diam end sys: 44 mm
LA diam index: 2.59 cm/m2
LA vol: 74.9 mL
LAVOLA4C: 75.5 mL
LAVOLIN: 44.1 mL/m2
LV E/e'average: 17.05
LV TDI E'LATERAL: 5.35
LVEEMED: 17.05
LVELAT: 5.35 cm/s
LVOT VTI: 23.7 cm
LVOT area: 3.14 cm2
LVOT diameter: 20 mm
LVOT peak vel: 108 cm/s
LVOTSV: 74 mL
MV Dec: 232
MV pk A vel: 110 m/s
MV pk E vel: 91.2 m/s
MVPG: 3 mmHg
PW: 12.1 mm — AB (ref 0.6–1.1)
RV LATERAL S' VELOCITY: 14.3 cm/s
RV TAPSE: 24.8 mm
TDI e' medial: 5.4
WEIGHTICAEL: 2355.2 [oz_av]

## 2016-06-15 LAB — CBC WITH DIFFERENTIAL/PLATELET
Basophils Absolute: 0.1 10*3/uL (ref 0.0–0.1)
Basophils Relative: 0 %
Eosinophils Absolute: 0.3 10*3/uL (ref 0.0–0.7)
Eosinophils Relative: 1 %
HEMATOCRIT: 25 % — AB (ref 36.0–46.0)
HEMOGLOBIN: 8 g/dL — AB (ref 12.0–15.0)
LYMPHS ABS: 1.4 10*3/uL (ref 0.7–4.0)
LYMPHS PCT: 7 %
MCH: 30.1 pg (ref 26.0–34.0)
MCHC: 32 g/dL (ref 30.0–36.0)
MCV: 94 fL (ref 78.0–100.0)
MONOS PCT: 7 %
Monocytes Absolute: 1.4 10*3/uL — ABNORMAL HIGH (ref 0.1–1.0)
NEUTROS ABS: 18.6 10*3/uL — AB (ref 1.7–7.7)
NEUTROS PCT: 85 %
Platelets: 313 10*3/uL (ref 150–400)
RBC: 2.66 MIL/uL — AB (ref 3.87–5.11)
RDW: 17.3 % — ABNORMAL HIGH (ref 11.5–15.5)
WBC: 21.8 10*3/uL — AB (ref 4.0–10.5)

## 2016-06-15 LAB — GLUCOSE, CAPILLARY
GLUCOSE-CAPILLARY: 146 mg/dL — AB (ref 65–99)
Glucose-Capillary: 118 mg/dL — ABNORMAL HIGH (ref 65–99)
Glucose-Capillary: 168 mg/dL — ABNORMAL HIGH (ref 65–99)
Glucose-Capillary: 146 mg/dL — ABNORMAL HIGH (ref 65–99)

## 2016-06-15 LAB — RENAL FUNCTION PANEL
ANION GAP: 10 (ref 5–15)
Albumin: 2.1 g/dL — ABNORMAL LOW (ref 3.5–5.0)
BUN: 20 mg/dL (ref 6–20)
CO2: 26 mmol/L (ref 22–32)
Calcium: 7.3 mg/dL — ABNORMAL LOW (ref 8.9–10.3)
Chloride: 99 mmol/L — ABNORMAL LOW (ref 101–111)
Creatinine, Ser: 4.25 mg/dL — ABNORMAL HIGH (ref 0.44–1.00)
GFR calc non Af Amer: 11 mL/min — ABNORMAL LOW (ref >60)
GFR, EST AFRICAN AMERICAN: 12 mL/min — AB (ref >60)
GLUCOSE: 157 mg/dL — AB (ref 65–99)
PHOSPHORUS: 2 mg/dL — AB (ref 2.5–4.6)
POTASSIUM: 3.4 mmol/L — AB (ref 3.5–5.1)
Sodium: 135 mmol/L (ref 135–145)

## 2016-06-15 MED ORDER — HYDRALAZINE HCL 20 MG/ML IJ SOLN: 10.0000 mg | INTRAMUSCULAR | Status: DC | PRN

## 2016-06-15 MED ORDER — IOPAMIDOL (ISOVUE-300) INJECTION 61%
INTRAVENOUS | Status: AC
  Administered 2016-06-15: 100 mL
  Filled 2016-06-15: qty 100

## 2016-06-15 MED ORDER — LABETALOL HCL 200 MG PO TABS
400.0000 mg | ORAL_TABLET | Freq: Two times a day (BID) | ORAL | Status: DC
  Administered 2016-06-15 – 2016-06-23 (×18): 400 mg via ORAL
  Filled 2016-06-15 (×18): qty 2

## 2016-06-15 NOTE — Progress Notes (Signed)
Admit: 06/12/2016 LOS: 3  60F with AoCKD5, now uremia and ESRD, pyelonephritis, likely mild hypovolemia  Subjective:  HD # 2 yesterday evening WBC remains elevated Ate ok yesterday Low grade fevers this AM; on vanc/cefepime; Cx data is from [facility]  02/17 0701 - 02/18 0700 In: 1760 [P.O.:1440; IV Piggyback:100] Out: 250 [Urine:250]  Filed Weights   06/14/16 1845 06/14/16 2157 06/14/16 2218  Weight: 66.9 kg (147 lb 7.8 oz) 66.8 kg (147 lb 4.3 oz) 66.8 kg (147 lb 3.2 oz)    Scheduled Meds: . sodium chloride   Intravenous Once  . sodium chloride   Intravenous Once  . amLODipine  10 mg Oral Daily  . calcium carbonate  1 tablet Oral QAC supper  . ceFEPime (MAXIPIME) IV  1 g Intravenous QHS  . enoxaparin (LOVENOX) injection  30 mg Subcutaneous Daily  . famotidine (PEPCID) IV  20 mg Intravenous Q24H  . insulin aspart  0-9 Units Subcutaneous TID WC  . labetalol  400 mg Oral BID   Continuous Infusions: PRN Meds:.acetaminophen, diphenhydrAMINE, hydrALAZINE, promethazine, sodium chloride flush  Current Labs: reviewed    Physical Exam:  Blood pressure (!) 185/63, pulse 74, temperature 98.6 F (37 C), temperature source Oral, resp. rate 18, height 5\' 3"  (1.6 m), weight 66.8 kg (147 lb 3.2 oz), SpO2 100 %. NAD, more awake and alert RRR CTAB No LEE S/ND/ND R IJ Temp HD cath bandaged Asterixus resolved NCAT EOMI  A 1. New ESRD, uremic 1. Outpt plan was actively pursuing PD 2. Started HD 06/13/16 3. Temp R IJ HD Cath CCM placed 2/16 2. Pyelonephritis on Vanc / Cefepime with TRH  3. Leukocytosis 2/2 #2 4. Hypovolemia, resolved 5. HTN on CCB and MTP 6. Anemia on ESA as outpt 7. Hypokalemia, likely 2/2 poor PO intake  P 1. HD #3: 4K bath, no UF, No heparin, Temp HD cath, 3h 1. Once ID issues resolved (fevers, WBC, Cx data) will need TDC and eval for permanent access 2. ABX per TRH, await Cx data to narrow; ok with contrasted CT scan given now on dilaysis 3. CLIP in  process; should be at [facility] MD 06/15/2016, 9:58 AM   Recent Labs Lab 06/13/16 0622 06/14/16 1637 06/15/16 0502  NA 134* 134* 135  K 3.2* 3.6 3.4*  CL 96* 98* 99*  CO2 21* 22 26  GLUCOSE 104* 192* 157*  BUN 153* 68* 20  CREATININE 14.57* 8.75* 4.25*  CALCIUM 7.4* 7.3* 7.3*  PHOS  --   --  2.0*    Recent Labs Lab 06/13/16 0622 06/14/16 1637 06/15/16 0502  WBC 18.4* 21.4* 21.8*  NEUTROABS 15.9* 19.2* 18.6*  HGB 6.9* 8.2* 8.0*  HCT 21.4* 25.5* 25.0*  MCV 94.7 93.8 94.0  PLT 382 311 313

## 2016-06-15 NOTE — Progress Notes (Addendum)
PROGRESS NOTE    Alison Baker  DSK:[id number] DOB: 12-19-1955 DOA: 06/12/2016 PCP: Charletta Cousin, MD   Brief Narrative: 61 y.o. female with PMH of anemia renal disease, chronic renal disease, PUD, GERD, hypertension, prediabetes who is transferred from [facility] after presenting there with complaints of diarrhea, nausea and emesis for the past 3 weeks.  Per patient, about 3 weeks ago she was diagnosed with the flu. She was prescribed Tamiflu 75 mg a day, but was only able to take it 3 days as it induced nausea and emesis. Since then the patient has been having nausea most of the day, frequent emesis and diarrhea several times a day. She denies fever, chills, but feels very fatigued.  Urinalysis shows 3+ bacteria, too numerous to count WBC, 3+ protein, 2+ leukocyte esterase and negative nitrite. WBC of 17.4, hemoglobin 7.6 and platelets of 453. Sodium 135, potassium 3.3, chloride 92, bicarbonate 23 mmol/L.  Imaging at [facility]: CT scan of the abdomen and pelvis shows minimal bilateral hydronephrosis without obstructing stone, but with more prominent left-sided perinephric stranding and fluid. There is wall thickening of both renal pelvises. Findings are concerning for bilateral pyelonephritis and left-sided ureteritis. Prominent retroperitoneal nodes. [id number] evolving abscess in left kidney. Please see CT scan report on transfer package for further detail.  Assessment & Plan:   Principal Problem:   Acute on chronic kidney failure (HCC) Active Problems:   Essential hypertension   Anemia of renal disease   GERD (gastroesophageal reflux disease)   Pre-diabetes   UTI (urinary tract infection)   Nausea and vomiting   Pyelonephritis    Left Perirenal abscess   Acute on chronic kidney failure ([place])- with uremia, ESRD on HD. HD #1- 06/13/16. - On Vancomycin per pharm- 2/16>> 2/17 - cefepime per pharmacy 2/16 >> - [facility], 2/17, called nursing supervisor,  urine cultures pending, no prelim. No blood cultures. - Please call [facility] again    Pyelonephritis with perinephric abscess-  Per CT scan, the patient has pyelonephritis, left ureteritis and cystitis. CT scan with contrast repeated- about same size- 4 x 2 x 3.7 cm - We will DC vancomycin, continue cefepime for gram-negative coverage - Consult IR in a.m. for possible drainage with persistent leukocytosis, and abscess not reducing in size. - Talked to nephrology about getting CT W contrast today, he was ok with it, next HD- 2/19.     Essential hypertension- elevated  - Increase labetalol to 400 mg twice a day  - continue Norvasc - When necessary hydralazine    Anemia of renal disease- Hgb 6.9 >> 8.0 Transfused with HD  Erythropoietin per nephrology.    GERD (gastroesophageal reflux disease) Famotidine 20 mg IV PB every 12 hours.    Pre-diabetes Carbohydrate modified diet. CBG monitoring with regular insulin sliding scale.      Nausea and vomiting Antiemetics as needed.  Murmur- tricuspid area loudest, 3/6. Per patient no history of murmur. ?Flow murmur. Echo 2/18- mild pulmonary and tricuspid regurgitation, EF 55-60%. Likely PFO consider bubble study if clinically indicated  DVT prophylaxis: Lovenox SQ. Code Status: Full code. Family Communication: Her spouse is present in the room. Disposition Plan: Admit for IV antibiotic therapy and nephrology consult. Consults called: Left message for nephrology. Admission status: Inpatient/telemetry.   Consultants:   Nephrology  Procedures:  - Renal ultrasound- Mild bilateral hydronephrosis.  No definite mass identified about the left kidney to correspond with previously described perinephric mass on prior CT. As previously recommended, when patient clinically  able, recommend correlation with pre and post contrast-enhanced CT or MRI.  Antimicrobials:  - Vanc- 2/16 >> 2/17 - Cefepime- 2/16 >>  Subjective: Left flank pain  has resolved . The nausea vomiting no chest pain or shortness of breath .   Objective: Vitals:   06/14/16 2218 06/15/16 0518 06/15/16 0918 06/15/16 1754  BP: (!) 188/66 (!) 161/57 (!) 185/63 (!) 141/65  Pulse: 85 69 74 76  Resp: (!) 22 20 18 18   Temp: 98.6 F (37 C) 98.5 F (36.9 C) 98.6 F (37 C) 98.2 F (36.8 C)  TempSrc: Oral Oral Oral Oral  SpO2: 100% 99% 100% 100%  Weight: 66.8 kg (147 lb 3.2 oz)     Height:        Intake/Output Summary (Last 24 hours) at 06/15/16 1918 Last data filed at 06/15/16 1700  Gross per 24 hour  Intake             1160 ml  Output              175 ml  Net              985 ml   Filed Weights   06/14/16 1845 06/14/16 2157 06/14/16 2218  Weight: 66.9 kg (147 lb 7.8 oz) 66.8 kg (147 lb 4.3 oz) 66.8 kg (147 lb 3.2 oz)    Examination:  General exam: Appears calm and comfortable, In no acute distress Respiratory system: Clear to auscultation. Respiratory effort normal. Cardiovascular system: S1 & S2 heard, RRR. 3/6 systolic murmur loudest in tricuspid area. No pedal edema. Gastrointestinal system: Abdomen is nondistended, soft and nontender. No organomegaly or masses felt. Normal bowel sounds heard. No CVA tenderness Central nervous system: Alert and oriented. No focal neurological deficits. Extremities: Symmetric 5 x 5 power. Skin: No rashes, lesions or ulcers.   Data Reviewed: I have personally reviewed following labs and imaging studies  CBC:  Recent Labs Lab 06/13/16 0622 06/14/16 1637 06/15/16 0502  WBC 18.4* 21.4* 21.8*  NEUTROABS 15.9* 19.2* 18.6*  HGB 6.9* 8.2* 8.0*  HCT 21.4* 25.5* 25.0*  MCV 94.7 93.8 94.0  PLT 382 311 425   Basic Metabolic Panel:  Recent Labs Lab 06/13/16 0622 06/14/16 1637 06/15/16 0502  NA 134* 134* 135  K 3.2* 3.6 3.4*  CL 96* 98* 99*  CO2 21* 22 26  GLUCOSE 104* 192* 157*  BUN 153* 68* 20  CREATININE 14.57* 8.75* 4.25*  CALCIUM 7.4* 7.3* 7.3*  PHOS  --   --  2.0*   Liver Function  Tests:  Recent Labs Lab 06/13/16 0622 06/13/16 1638 06/15/16 0502  AST 11*  --   --   ALT 11* 10*  --   ALKPHOS 97  --   --   BILITOT 1.5*  --   --   PROT 5.8*  --   --   ALBUMIN 2.3*  --  2.1*   CBG:  Recent Labs Lab 06/14/16 1719 06/14/16 2316 06/15/16 0740 06/15/16 1212 06/15/16 1815  GLUCAP 199* 109* 118* 146* 168*    Radiology Studies: Ct Abdomen Pelvis W Wo Contrast  Result Date: 06/15/2016 CLINICAL DATA:  Renal mass on ultrasound. EXAM: CT ABDOMEN AND PELVIS WITHOUT AND WITH CONTRAST TECHNIQUE: Multidetector CT imaging of the abdomen and pelvis was performed following the standard protocol before and following the bolus administration of intravenous contrast. CONTRAST:  121mL ISOVUE-300 IOPAMIDOL (ISOVUE-300) INJECTION 61% COMPARISON:  Must cell a Sim, ultrasound exam 06/14/2016 FINDINGS: Lower chest:  Unremarkable. Hepatobiliary:  No focal abnormality within the liver parenchyma. Gallbladder surgically absent. No intrahepatic or extrahepatic biliary dilation. Pancreas: No focal mass lesion. No dilatation of the main duct. No intraparenchymal cyst. No peripancreatic edema. Spleen: No splenomegaly. No focal mass lesion. Adrenals/Urinary Tract: No adrenal nodule or mass. Wall thickening is noted in the right renal pelvis (see image 42 series 13. No discrete intraparenchymal mass lesion identified within the right kidney. Left kidney demonstrates heterogeneous enhancement mainly in the upper pole and interpolar region and there is prominent left perinephric edema/inflammation. A multiloculated 4.0 x 2.0 x 3.7 cm rim enhancing fluid collections identified anterior to the interpolar left kidney and tracks down towards the lower pole. This is associated with wall thickening in the renal pelvis and renal calices of the left kidney. Contrast excretion is delayed from both kidneys. No evidence for hydroureter. Circumferential bladder wall thickening noted. Gas in the urinary bladder may be  related to recent instrumentation or infection. Stomach/Bowel: Stomach is nondistended. No gastric wall thickening. No evidence of outlet obstruction. Duodenum is normally positioned as is the ligament of Treitz. No small bowel wall thickening. No small bowel dilatation. The terminal ileum is normal. The appendix is not visualized, but there is no edema or inflammation in the region of the cecum. Diverticular changes are noted in the left colon without evidence of diverticulitis. Vascular/Lymphatic: There is abdominal aortic atherosclerosis without aneurysm. Small lymph nodes identified gastrohepatic and hepatoduodenal ligaments. Small left para-aortic lymph nodes are identified and measure up to 9 mm short axis which is borderline increased. No pelvic sidewall lymphadenopathy. Reproductive: Uterus surgically absent.  There is no adnexal mass. Other: No intraperitoneal free fluid. Musculoskeletal: Midline fat show laxity evident. Bone windows reveal no worrisome lytic or sclerotic osseous lesions. IMPRESSION: 1. Wall thickening identified and the renal pelvis of each kidney with probable caliceal wall thickening in the left kidney. Changes are associated with circumferential bladder wall thickening and gas in the bladder lumen. Given bilateral involvement, infectious etiology favored over other possibilities such as urothelial neoplasm. 2. Heterogeneous perfusion left kidney likely related to infection/pyelonephritis. 3. 4.0 x 2.0 x 3.7 cm rim enhancing multiloculated fluid collection identified in the perinephric fat anterior to the left kidney. Perinephric abscess could have this appearance. Given delayed contrast excretion, the left intrarenal collecting system and renal pelvis is not opacified despite repeat delayed imaging, and urinoma cannot be entirely excluded. 4. Borderline retroperitoneal lymphadenopathy in the abdomen. Electronically Signed   By: Misty Stanley M.D.   On: 06/15/2016 18:42   US  Renal  Result Date: 06/14/2016 CLINICAL DATA:  Patient with bilateral pyelonephritis. Evolving left perinephric abscess on CT. EXAM: RENAL / URINARY TRACT ULTRASOUND COMPLETE COMPARISON:  CT abdomen pelvis 06/12/2016. FINDINGS: Right Kidney: Length: 10.4 cm. Normal renal cortical thickness echogenicity. Mild hydronephrosis. Left Kidney: Length: 10.9 cm. Normal renal cortical thickness and echogenicity. Mild hydronephrosis. No definite pararenal mass identified. Bladder: Decompressed with Foley catheter. IMPRESSION: Mild bilateral hydronephrosis. No definite mass identified about the left kidney to correspond with previously described perinephric mass on prior CT. As previously recommended, when patient clinically able, recommend correlation with pre and post contrast-enhanced CT or MRI. Electronically Signed   By: Lovey Newcomer M.D.   On: 06/14/2016 14:45    Scheduled Meds: . sodium chloride   Intravenous Once  . sodium chloride   Intravenous Once  . amLODipine  10 mg Oral Daily  . calcium carbonate  1 tablet Oral QAC supper  . ceFEPime (MAXIPIME) IV  1 g Intravenous QHS  . enoxaparin (LOVENOX) injection  30 mg Subcutaneous Daily  . famotidine (PEPCID) IV  20 mg Intravenous Q24H  . insulin aspart  0-9 Units Subcutaneous TID WC  . labetalol  400 mg Oral BID   Continuous Infusions:   LOS: 3 days   Bethena Roys, MD [facility] Pager [phone]  If 7PM-7AM, please contact night-coverage [email or web address] Password [facility] 06/15/2016, 7:18 PM

## 2016-06-15 NOTE — Progress Notes (Signed)
  Echocardiogram 2D Echocardiogram has been performed.  Johny Chess 06/15/2016, 9:00 AM

## 2016-06-16 LAB — CBC
HCT: 21.2 % — ABNORMAL LOW (ref 36.0–46.0)
HEMOGLOBIN: 6.7 g/dL — AB (ref 12.0–15.0)
MCH: 30.3 pg (ref 26.0–34.0)
MCHC: 31.6 g/dL (ref 30.0–36.0)
MCV: 95.9 fL (ref 78.0–100.0)
Platelets: 230 10*3/uL (ref 150–400)
RBC: 2.21 MIL/uL — ABNORMAL LOW (ref 3.87–5.11)
RDW: 17.4 % — AB (ref 11.5–15.5)
WBC: 13.1 10*3/uL — ABNORMAL HIGH (ref 4.0–10.5)

## 2016-06-16 LAB — BASIC METABOLIC PANEL
ANION GAP: 8 (ref 5–15)
BUN: 22 mg/dL — AB (ref 6–20)
CALCIUM: 7.2 mg/dL — AB (ref 8.9–10.3)
CO2: 25 mmol/L (ref 22–32)
Chloride: 101 mmol/L (ref 101–111)
Creatinine, Ser: 5.5 mg/dL — ABNORMAL HIGH (ref 0.44–1.00)
GFR calc Af Amer: 9 mL/min — ABNORMAL LOW (ref >60)
GFR, EST NON AFRICAN AMERICAN: 8 mL/min — AB (ref >60)
GLUCOSE: 115 mg/dL — AB (ref 65–99)
Potassium: 3.2 mmol/L — ABNORMAL LOW (ref 3.5–5.1)
Sodium: 134 mmol/L — ABNORMAL LOW (ref 135–145)

## 2016-06-16 LAB — PREPARE RBC (CROSSMATCH)

## 2016-06-16 LAB — PHOSPHORUS: PHOSPHORUS: 2.7 mg/dL (ref 2.5–4.6)

## 2016-06-16 LAB — HEMOGLOBIN AND HEMATOCRIT, BLOOD
HCT: 21.9 % — ABNORMAL LOW (ref 36.0–46.0)
Hemoglobin: 7.1 g/dL — ABNORMAL LOW (ref 12.0–15.0)

## 2016-06-16 LAB — GLUCOSE, CAPILLARY
GLUCOSE-CAPILLARY: 109 mg/dL — AB (ref 65–99)
Glucose-Capillary: 141 mg/dL — ABNORMAL HIGH (ref 65–99)
Glucose-Capillary: 146 mg/dL — ABNORMAL HIGH (ref 65–99)

## 2016-06-16 LAB — HEMOGLOBIN A1C
Hgb A1c MFr Bld: 4.7 % — ABNORMAL LOW (ref 4.8–5.6)
MEAN PLASMA GLUCOSE: 88 mg/dL

## 2016-06-16 MED ORDER — DARBEPOETIN ALFA 150 MCG/0.3ML IJ SOSY
150.0000 ug | PREFILLED_SYRINGE | INTRAMUSCULAR | Status: DC
  Administered 2016-06-16 – 2016-06-23 (×2): 150 ug via INTRAVENOUS
  Filled 2016-06-16 (×2): qty 0.3

## 2016-06-16 MED ORDER — FAMOTIDINE 20 MG PO TABS
20.0000 mg | ORAL_TABLET | Freq: Every day | ORAL | Status: DC
  Administered 2016-06-16 – 2016-06-23 (×8): 20 mg via ORAL
  Filled 2016-06-16 (×8): qty 1

## 2016-06-16 MED ORDER — DARBEPOETIN ALFA 150 MCG/0.3ML IJ SOSY
PREFILLED_SYRINGE | INTRAMUSCULAR | Status: AC
  Filled 2016-06-16: qty 0.3

## 2016-06-16 MED ORDER — ENOXAPARIN SODIUM 30 MG/0.3ML ~~LOC~~ SOLN
30.0000 mg | Freq: Every day | SUBCUTANEOUS | Status: DC
  Administered 2016-06-16 – 2016-06-18 (×3): 30 mg via SUBCUTANEOUS
  Filled 2016-06-16 (×4): qty 0.3

## 2016-06-16 MED ORDER — ENOXAPARIN SODIUM 30 MG/0.3ML ~~LOC~~ SOLN: 30.0000 mg | Freq: Every day | SUBCUTANEOUS | Status: DC

## 2016-06-16 MED ORDER — DIPHENHYDRAMINE HCL 12.5 MG/5ML PO ELIX
12.5000 mg | ORAL_SOLUTION | Freq: Four times a day (QID) | ORAL | Status: DC | PRN
  Filled 2016-06-16: qty 5

## 2016-06-16 MED ORDER — SODIUM CHLORIDE 0.9 % IV SOLN: Freq: Once | INTRAVENOUS | Status: DC

## 2016-06-16 NOTE — Progress Notes (Addendum)
Pharmacy Antibiotic Note  Alison Baker is a 61 y.o. female with h/o ESRD not on HD transferred from [facility] on 06/12/2016 with pyelonephritis/perirenal abcess.  Pharmacy  consulted for cefepime for pyelonephritis with perinephric abscess.  WBC down to 13.1, afebrile.  Pt new start HD this admission.  2/14urine from [place] [facility]: > 100K E coli, sensitive to all X ampicillin and Unasyn > 100K strep agalactaie/group B. Sensitive to vanc, CTX, PCN, amp, cefotaxime, Lvq, zyvox Both E Coli and group B strep are covered by cefepime.  Could narrow to cefazolin.   Plan:  Consider de-escalating to ancef 1 gm q24, consider adding flagyl for anaerobic coverage if desired Cefepime 1 g IV q24h- could change to 2 gm after HD once on stable HD regimen Monitor culture data, HD plans and clinical course  Height: 5\' 3"  (160 cm) Weight: 149 lb 11.1 oz (67.9 kg) IBW/kg (Calculated) : 52.4  Temp (24hrs), Avg:98.6 F (37 C), Min:98.2 F (36.8 C), Max:100 F (37.8 C)  Labs (at [facility]): WBC  17.4 Hgb  7.6 Ct  23.2 Plt  453  Scr 14.0   Recent Labs Lab 06/13/16 0622 06/14/16 1637 06/15/16 0502 06/16/16 0412 06/16/16 0741  WBC 18.4* 21.4* 21.8*  --  13.1*  CREATININE 14.57* 8.75* 4.25* 5.50*  --     Estimated Creatinine Clearance: 10.3 mL/min (by C-G formula based on SCr of 5.5 mg/dL (H)).    Allergies  Allergen Reactions  . Strawberry Extract Swelling  . Nsaids Other (See Comments)    Has a healing stomach ulcer  . Amoxicillin Rash  . Clonidine Nausea And Vomiting  . Penicillins Rash      Vancomycin 2/16>>2/17   Cefepime 2/16>>   Ceftriaxone x 1 on 2/16 at 0215   2/19 Ucx>> 2/19 BCx2>>   UA at [facility] noted 3+ bacteria, WBCs TNTC   2/14urine from [place] [facility]: > 100K E coli, sensitive to all X ampicillin and Unasyn > 100K strep agalactaie/group B. Sensitive to vanc, CTX, PCN, amp, cefotaxime, Lvq, zyvox Hep B neg   Eudelia Bunch, Pharm.D. [phone] 06/16/2016 1:51  PM

## 2016-06-16 NOTE — Progress Notes (Signed)
Imaging reviewed by Dr. Markus Daft  Difficult location for drainage or even aspiration of fluid collection  Recommend antibiotics and repeat imaging in a few days.  Alison Cormier S Ngai Parcell PA-C 06/16/2016 9:33 AM

## 2016-06-16 NOTE — Procedures (Signed)
I was present at this dialysis session. I have reviewed the session itself and made appropriate changes.   Tolerating well with TEMP HD cath.  4K bath. No UF.  HD #3 today.    CT A/P with IV contrast identified ascending GU infection and likely perinephritic abscess, IR to eval.     Once infectious issues resolved will need eval for temp cath and AVF.      Filed Weights   06/14/16 2218 06/15/16 2130 06/16/16 0700  Weight: 66.8 kg (147 lb 3.2 oz) 66.8 kg (147 lb 3.2 oz) 67.9 kg (149 lb 11.1 oz)     Recent Labs Lab 06/16/16 0412 06/16/16 0741  NA 134*  --   K 3.2*  --   CL 101  --   CO2 25  --   GLUCOSE 115*  --   BUN 22*  --   CREATININE 5.50*  --   CALCIUM 7.2*  --   PHOS  --  2.7     Recent Labs Lab 06/13/16 0622 06/14/16 1637 06/15/16 0502 06/16/16 0741 06/16/16 0828  WBC 18.4* 21.4* 21.8* 13.1*  --   NEUTROABS 15.9* 19.2* 18.6*  --   --   HGB 6.9* 8.2* 8.0* 6.7* 7.1*  HCT 21.4* 25.5* 25.0* 21.2* 21.9*  MCV 94.7 93.8 94.0 95.9  --   PLT 382 311 313 230  --     Scheduled Meds: . sodium chloride   Intravenous Once  . amLODipine  10 mg Oral Daily  . calcium carbonate  1 tablet Oral QAC supper  . ceFEPime (MAXIPIME) IV  1 g Intravenous QHS  . enoxaparin (LOVENOX) injection  30 mg Subcutaneous Daily  . famotidine (PEPCID) IV  20 mg Intravenous Q24H  . insulin aspart  0-9 Units Subcutaneous TID WC  . labetalol  400 mg Oral BID   Continuous Infusions: PRN Meds:.acetaminophen, diphenhydrAMINE, hydrALAZINE, promethazine, sodium chloride flush   Pearson Grippe  MD 06/16/2016, 8:53 AM

## 2016-06-16 NOTE — Progress Notes (Signed)
[facility] and spoke with Hilda Blades in the lab. [phone]) Requested culture results for patient. Stated the only results that were available were from 2/15ER Visit Urine Culture. Results faxed, and received. Leodis Sias Pharmacist to place in chart when review complete. Text paged Dr. Carolin Sicks and made aware. Adekunle Rohrbach, Bryn Gulling

## 2016-06-16 NOTE — Progress Notes (Addendum)
Patient hgb 6.7 Called in to hemodialysis. Called in to Dr. Carolin Sicks.  Hemodialysis RN Angelito aware of [facility] ordered.  10:42am Per Angelito, 2uPRBC given in HD.

## 2016-06-16 NOTE — Progress Notes (Signed)
PROGRESS NOTE    Alison Baker  XBO:[id number] DOB: [birth year] DOA: 06/12/2016 PCP: Charletta Cousin, MD   Brief Narrative: 61 y.o.femalewith PMH of anemia renal disease, chronic renal disease, PUD, GERD, hypertension, prediabetes who is transferred from [facility] after presenting there with complaints of diarrhea, nausea and emesis for the past 3 weeks. Patient reported flu 3 weeks before this admission.   Imaging at [facility]:CT scan of the abdomen and pelvis shows minimal bilateral hydronephrosis without obstructing stone, but with more prominent left-sided perinephric stranding and fluid. There is wall thickening of both renal pelvises. Findings are concerning for bilateral pyelonephritis and left-sided ureteritis. Prominent retroperitoneal nodes. [id number] evolving abscess in left kidney. Please see CT scan report on transfer package for further detail.  Assessment & Plan:  # Acute kidney injury on chronic kidney disease stage 5, now hemodialysis dependent: -Initiated hemodialysis treatment for uremia on 2/16.  -Plan for tunneled catheter when infection results -Receiving hemodialysis treatment today as per nephrologist.  #Pyelonephritis and possible left perinephric abscess: CT scan of abdomen and pelvis reviewed. -Currently on IV cefepime. Trying to receive culture results from [facility] to guide the antibiotics treatment. I will check 2 sets of blood cultures and urine culture to confirm clearance. -Intravenous normal radiology was consulted today for possible drainage of the abscesses. As per IR it is difficult location for drainage or even aspiration of fluid collection. They recommended to continue antibiotics and repeat imaging studies in a few days. -Continue to monitor temperature and leukocytosis.  #Essential hypertension: Blood pressure elevated. Receiving hemodialysis today. Continue amlodipine and labetalol. Monitor blood pressure.  #Anemia of  chronic kidney disease: Ordered 2 units of red blood cell transfusion during hemodialysis today. ESA and IV iron during dialysis as per nephrologist  #Acid reflux: Continue Pepcid  # Nausea vomiting: Continue supportive care. -Patient has A1c of 4.7 and patient does not have diabetes.  Principal Problem:   Acute on chronic kidney failure (HCC) Active Problems:   Essential hypertension   Anemia of renal disease   GERD (gastroesophageal reflux disease)    UTI (urinary tract infection)   Nausea and vomiting   Pyelonephritis    Left Perirenal abscess DVT prophylaxis: Lovenox subcutaneous Code Status: Full code Family Communication: I met the patient's husband at her room. Disposition Plan: Currently inpatient. Likely discharge home in 2-3 days,  per nephrologist    Consultants:   Nephrology  Interventional radiology  Procedures: Renal ultrasound Antimicrobials: Vanc- 2/16 >> 2/17 - Cefepime- 2/16 >> Subjective: Patient was seen and examined at hemodialysis unit. She reported feeling better. Denied headache, dizziness, nausea, vomiting, chest pain, back pain or abdominal pain. Tolerating hemodialysis well.  Objective: Vitals:   06/16/16 1000 06/16/16 1005 06/16/16 1020 06/16/16 1114  BP: (!) 167/65 (!) 165/65 (!) 165/70 (!) 179/66  Pulse: 65 66 66 73  Resp: '20 20 18 18  ' Temp:  98.4 F (36.9 C) 98.7 F (37.1 C) 98.4 F (36.9 C)  TempSrc:   Oral Oral  SpO2:   98% 100%  Weight:   67.9 kg (149 lb 11.1 oz)   Height:        Intake/Output Summary (Last 24 hours) at 06/16/16 1139 Last data filed at 06/16/16 1020  Gross per 24 hour  Intake             1250 ml  Output              176 ml  Net  1074 ml   Filed Weights   06/15/16 2130 06/16/16 0700 06/16/16 1020  Weight: 66.8 kg (147 lb 3.2 oz) 67.9 kg (149 lb 11.1 oz) 67.9 kg (149 lb 11.1 oz)    Examination:  General exam: Appears calm and comfortable  Respiratory system: Clear to auscultation.  Respiratory effort normal. No wheezing or crackle Cardiovascular system: S1 & S2 heard, RRR.  No pedal edema. Gastrointestinal system: Abdomen is nondistended, soft and nontender. Normal bowel sounds heard. Central nervous system: Alert and oriented. No focal neurological deficits. Extremities: Symmetric 5 x 5 power. Skin: No rashes, lesions or ulcers Psychiatry: Judgement and insight appear normal. Mood & affect appropriate.     Data Reviewed: I have personally reviewed following labs and imaging studies  CBC:  Recent Labs Lab 06/13/16 0622 06/14/16 1637 06/15/16 0502 06/16/16 0741 06/16/16 0828  WBC 18.4* 21.4* 21.8* 13.1*  --   NEUTROABS 15.9* 19.2* 18.6*  --   --   HGB 6.9* 8.2* 8.0* 6.7* 7.1*  HCT 21.4* 25.5* 25.0* 21.2* 21.9*  MCV 94.7 93.8 94.0 95.9  --   PLT 382 311 313 230  --    Basic Metabolic Panel:  Recent Labs Lab 06/13/16 0622 06/14/16 1637 06/15/16 0502 06/16/16 0412 06/16/16 0741  NA 134* 134* 135 134*  --   K 3.2* 3.6 3.4* 3.2*  --   CL 96* 98* 99* 101  --   CO2 21* '22 26 25  ' --   GLUCOSE 104* 192* 157* 115*  --   BUN 153* 68* 20 22*  --   CREATININE 14.57* 8.75* 4.25* 5.50*  --   CALCIUM 7.4* 7.3* 7.3* 7.2*  --   PHOS  --   --  2.0*  --  2.7   GFR: Estimated Creatinine Clearance: 10.3 mL/min (by C-G formula based on SCr of 5.5 mg/dL (H)). Liver Function Tests:  Recent Labs Lab 06/13/16 0622 06/13/16 1638 06/15/16 0502  AST 11*  --   --   ALT 11* 10*  --   ALKPHOS 97  --   --   BILITOT 1.5*  --   --   PROT 5.8*  --   --   ALBUMIN 2.3*  --  2.1*   No results for input(s): LIPASE, AMYLASE in the last 168 hours. No results for input(s): AMMONIA in the last 168 hours. Coagulation Profile: No results for input(s): INR, PROTIME in the last 168 hours. Cardiac Enzymes: No results for input(s): CKTOTAL, CKMB, CKMBINDEX, TROPONINI in the last 168 hours. BNP (last 3 results) No results for input(s): PROBNP in the last 8760  hours. HbA1C: No results for input(s): HGBA1C in the last 72 hours. CBG:  Recent Labs Lab 06/15/16 0740 06/15/16 1212 06/15/16 1815 06/15/16 2155 06/16/16 1114  GLUCAP 118* 146* 168* 146* 109*   Lipid Profile: No results for input(s): CHOL, HDL, LDLCALC, TRIG, CHOLHDL, LDLDIRECT in the last 72 hours. Thyroid Function Tests: No results for input(s): TSH, T4TOTAL, FREET4, T3FREE, THYROIDAB in the last 72 hours. Anemia Panel: No results for input(s): VITAMINB12, FOLATE, FERRITIN, TIBC, IRON, RETICCTPCT in the last 72 hours. Sepsis Labs: No results for input(s): PROCALCITON, LATICACIDVEN in the last 168 hours.  No results found for this or any previous visit (from the past 240 hour(s)).       Radiology Studies: Ct Abdomen Pelvis W Wo Contrast  Result Date: 06/15/2016 CLINICAL DATA:  Renal mass on ultrasound. EXAM: CT ABDOMEN AND PELVIS WITHOUT AND WITH CONTRAST TECHNIQUE: Multidetector CT imaging  of the abdomen and pelvis was performed following the standard protocol before and following the bolus administration of intravenous contrast. CONTRAST:  145m ISOVUE-300 IOPAMIDOL (ISOVUE-300) INJECTION 61% COMPARISON:  Must cell a Sim, ultrasound exam 06/14/2016 FINDINGS: Lower chest:  Unremarkable. Hepatobiliary: No focal abnormality within the liver parenchyma. Gallbladder surgically absent. No intrahepatic or extrahepatic biliary dilation. Pancreas: No focal mass lesion. No dilatation of the main duct. No intraparenchymal cyst. No peripancreatic edema. Spleen: No splenomegaly. No focal mass lesion. Adrenals/Urinary Tract: No adrenal nodule or mass. Wall thickening is noted in the right renal pelvis (see image 42 series 13. No discrete intraparenchymal mass lesion identified within the right kidney. Left kidney demonstrates heterogeneous enhancement mainly in the upper pole and interpolar region and there is prominent left perinephric edema/inflammation. A multiloculated 4.0 x 2.0 x 3.7 cm  rim enhancing fluid collections identified anterior to the interpolar left kidney and tracks down towards the lower pole. This is associated with wall thickening in the renal pelvis and renal calices of the left kidney. Contrast excretion is delayed from both kidneys. No evidence for hydroureter. Circumferential bladder wall thickening noted. Gas in the urinary bladder may be related to recent instrumentation or infection. Stomach/Bowel: Stomach is nondistended. No gastric wall thickening. No evidence of outlet obstruction. Duodenum is normally positioned as is the ligament of Treitz. No small bowel wall thickening. No small bowel dilatation. The terminal ileum is normal. The appendix is not visualized, but there is no edema or inflammation in the region of the cecum. Diverticular changes are noted in the left colon without evidence of diverticulitis. Vascular/Lymphatic: There is abdominal aortic atherosclerosis without aneurysm. Small lymph nodes identified gastrohepatic and hepatoduodenal ligaments. Small left para-aortic lymph nodes are identified and measure up to 9 mm short axis which is borderline increased. No pelvic sidewall lymphadenopathy. Reproductive: Uterus surgically absent.  There is no adnexal mass. Other: No intraperitoneal free fluid. Musculoskeletal: Midline fat show laxity evident. Bone windows reveal no worrisome lytic or sclerotic osseous lesions. IMPRESSION: 1. Wall thickening identified and the renal pelvis of each kidney with probable caliceal wall thickening in the left kidney. Changes are associated with circumferential bladder wall thickening and gas in the bladder lumen. Given bilateral involvement, infectious etiology favored over other possibilities such as urothelial neoplasm. 2. Heterogeneous perfusion left kidney likely related to infection/pyelonephritis. 3. 4.0 x 2.0 x 3.7 cm rim enhancing multiloculated fluid collection identified in the perinephric fat anterior to the left  kidney. Perinephric abscess could have this appearance. Given delayed contrast excretion, the left intrarenal collecting system and renal pelvis is not opacified despite repeat delayed imaging, and urinoma cannot be entirely excluded. 4. Borderline retroperitoneal lymphadenopathy in the abdomen. Electronically Signed   By: EMisty StanleyM.D.   On: 06/15/2016 18:42   [place]  Result Date: 06/14/2016 CLINICAL DATA:  Patient with bilateral pyelonephritis. Evolving left perinephric abscess on CT. EXAM: RENAL / URINARY TRACT ULTRASOUND COMPLETE COMPARISON:  CT abdomen pelvis 06/12/2016. FINDINGS: Right Kidney: Length: 10.4 cm. Normal renal cortical thickness echogenicity. Mild hydronephrosis. Left Kidney: Length: 10.9 cm. Normal renal cortical thickness and echogenicity. Mild hydronephrosis. No definite pararenal mass identified. Bladder: Decompressed with Foley catheter. IMPRESSION: Mild bilateral hydronephrosis. No definite mass identified about the left kidney to correspond with previously described perinephric mass on prior CT. As previously recommended, when patient clinically able, recommend correlation with pre and post contrast-enhanced CT or MRI. Electronically Signed   By: DLovey NewcomerM.D.   On: 06/14/2016 14:45  Scheduled Meds: . sodium chloride   Intravenous Once  . amLODipine  10 mg Oral Daily  . calcium carbonate  1 tablet Oral QAC supper  . ceFEPime (MAXIPIME) IV  1 g Intravenous QHS  . darbepoetin (ARANESP) injection - DIALYSIS  150 mcg Intravenous Q Mon-HD  . enoxaparin (LOVENOX) injection  30 mg Subcutaneous Daily  . famotidine  20 mg Oral QHS  . insulin aspart  0-9 Units Subcutaneous TID WC  . labetalol  400 mg Oral BID   Continuous Infusions:   LOS: 4 days    Dron Tanna Furry, MD [facility] Pager [phone]  If 7PM-7AM, please contact night-coverage [email or web address] Password [facility] 06/16/2016, 11:39 AM

## 2016-06-17 LAB — TYPE AND SCREEN
ABO/RH(D): O POS
ANTIBODY SCREEN: NEGATIVE
UNIT DIVISION: 0
Unit division: 0
Unit division: 0

## 2016-06-17 LAB — GLUCOSE, CAPILLARY
GLUCOSE-CAPILLARY: 127 mg/dL — AB (ref 65–99)
GLUCOSE-CAPILLARY: 164 mg/dL — AB (ref 65–99)
GLUCOSE-CAPILLARY: 176 mg/dL — AB (ref 65–99)
Glucose-Capillary: 108 mg/dL — ABNORMAL HIGH (ref 65–99)

## 2016-06-17 MED ORDER — HYDRALAZINE HCL 25 MG PO TABS
25.0000 mg | ORAL_TABLET | Freq: Three times a day (TID) | ORAL | Status: DC
  Administered 2016-06-17 – 2016-06-24 (×18): 25 mg via ORAL
  Filled 2016-06-17 (×20): qty 1

## 2016-06-17 MED ORDER — CEFAZOLIN IN D5W 1 GM/50ML IV SOLN
1.0000 g | INTRAVENOUS | Status: AC
  Administered 2016-06-17 – 2016-06-19 (×3): 1 g via INTRAVENOUS
  Filled 2016-06-17 (×5): qty 50

## 2016-06-17 NOTE — Progress Notes (Signed)
Patient ID: Alison Baker, female   DOB: [birth year], 61 y.o.   MRN: [id number]  [facility] Progress Note    Subjective:   Feeling a little better   Objective:   BP (!) 177/66 (BP Location: Left Arm)   Pulse 67   Temp 99.8 F (37.7 C) (Oral)   Resp 18   Ht 5\' 3"  (1.6 m)   Wt 68.5 kg (151 lb)   SpO2 100%   BMI 26.75 kg/m   Intake/Output: I/O last 3 completed shifts: In: 1420 [P.O.:600; Blood:670; IV Piggyback:150] Out: 1 [Urine:1]   Intake/Output this shift:  Total I/O In: 10 [I.V.:10] Out: -  Weight change: 1.131 kg (2 lb 7.9 oz)  Physical Exam: Gen:WD WF in NAD CVS:no rub Resp:cta GBT:DVVOHY Ext: trace edema  Labs: BMET  Recent Labs Lab 06/13/16 0622 06/14/16 1637 06/15/16 0502 06/16/16 0412 06/16/16 0741  NA 134* 134* 135 134*  --   K 3.2* 3.6 3.4* 3.2*  --   CL 96* 98* 99* 101  --   CO2 21* 22 26 25   --   GLUCOSE 104* 192* 157* 115*  --   BUN 153* 68* 20 22*  --   CREATININE 14.57* 8.75* 4.25* 5.50*  --   ALBUMIN 2.3*  --  2.1*  --   --   CALCIUM 7.4* 7.3* 7.3* 7.2*  --   PHOS  --   --  2.0*  --  2.7   CBC  Recent Labs Lab 06/13/16 0622 06/14/16 1637 06/15/16 0502 06/16/16 0741 06/16/16 0828  WBC 18.4* 21.4* 21.8* 13.1*  --   NEUTROABS 15.9* 19.2* 18.6*  --   --   HGB 6.9* 8.2* 8.0* 6.7* 7.1*  HCT 21.4* 25.5* 25.0* 21.2* 21.9*  MCV 94.7 93.8 94.0 95.9  --   PLT 382 311 313 230  --     @IMGRELPRIORS @ Medications:    . sodium chloride   Intravenous Once  . amLODipine  10 mg Oral Daily  . calcium carbonate  1 tablet Oral QAC supper  . ceFEPime (MAXIPIME) IV  1 g Intravenous QHS  . darbepoetin (ARANESP) injection - DIALYSIS  150 mcg Intravenous Q Mon-HD  . enoxaparin (LOVENOX) injection  30 mg Subcutaneous Daily  . famotidine  20 mg Oral QHS  . insulin aspart  0-9 Units Subcutaneous TID WC  . labetalol  400 mg Oral BID     Assessment/ Plan:   1. Pyelonephritis on vanc/cefepime 2. ESRD new start.  Plan for HD on  MWF for now and awaiting outpatient placement in [place].  Has temporary HD cath and will need more permanent access.  Was interested in PD but will need to start with incenter HD until stabilized. 3. 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 Nutrition: renal diet 6. Hypertension:stable 7. Vascular access- will need tunneled HD cath and AVF/AVG once ID issues addressed.  Donetta Potts, MD [place] Pager [phone] 06/17/2016, 11:26 AM

## 2016-06-17 NOTE — Progress Notes (Signed)
PROGRESS NOTE    Alison Baker  VOJ:[id number] DOB: [birth year] DOA: 06/12/2016 PCP: Charletta Cousin, MD   Brief Narrative: 61 y.o.femalewith PMH of anemia renal disease, chronic renal disease, PUD, GERD, hypertension, prediabetes who is transferred from [facility] after presenting there with complaints of diarrhea, nausea and emesis for the past 3 weeks. Patient reported flu 3 weeks before this admission.   Imaging at [facility]:CT scan of the abdomen and pelvis shows minimal bilateral hydronephrosis without obstructing stone, but with more prominent left-sided perinephric stranding and fluid. There is wall thickening of both renal pelvises. Findings are concerning for bilateral pyelonephritis and left-sided ureteritis. Prominent retroperitoneal nodes. 4.3X3.0X2.3 evolving abscess in left kidney. Please see CT scan report on transfer package for further detail.  Assessment & Plan:  # Acute kidney injury on chronic kidney disease stage 5, now hemodialysis dependent: -Initiated hemodialysis treatment for uremia on 2/16.  -Plan for tunneled catheter when infection results, the cultures were repeated on 2/19, if negative by 48 hours may consider placement of Foley catheter placement. Patient needs outpatient hemodialysis center set up before discharge.  #Pyelonephritis and possible left perinephric abscess: CT scan of abdomen and pelvis reviewed. -Currently on IV cefepime.  -As per urine culture result from [facility], the culture growing Escherichia coli and group B streptococcus sensitive to cefepime. Patient is clinically improving. Continue for now.  -Interventional radiology was consulted on 2/19 for possible drainage of the abscesses. As per IR it is difficult location for drainage or even aspiration of fluid collection. They recommended to continue antibiotics and repeat imaging studies in a few days. -Patient is afebrile. Monitor leukocytosis. Patient looks clinically  improving.  #Essential hypertension: Blood pressure elevated. Continue amlodipine and labetalol. I will add low dose hydralazine.  #Anemia of chronic kidney disease: Received 2 units of red blood cell transfusion during hemodialysis on 2/19. ESA and IV iron during dialysis as per nephrologist. Monitor hemoglobin.  #Acid reflux: Continue Pepcid  # Nausea vomiting: Continue supportive care. -Patient has A1c of 4.7 and patient does not have diabetes.  Principal Problem:   Acute on chronic kidney failure (HCC) Active Problems:   Essential hypertension   Anemia of renal disease   GERD (gastroesophageal reflux disease)    UTI (urinary tract infection)   Nausea and vomiting   Pyelonephritis    Left Perirenal abscess DVT prophylaxis: Lovenox subcutaneous Code Status: Full code Family Communication: Discussed with the patient's husband at bedside. Disposition Plan: Currently inpatient. Likely discharge home in 2-3 days,  per nephrologist    Consultants:   Nephrology  Interventional radiology  Procedures: Renal ultrasound Antimicrobials: Vanc- 2/16 >> 2/17 - Cefepime- 2/16 >> Subjective: Patient was seen and examined at hemodialysis unit. Patient reported feeling better. Denied back pain, nausea, vomiting, chest pain or shortness of breath. Her husband at bedside.  Objective: Vitals:   06/16/16 2020 06/17/16 0448 06/17/16 0909 06/17/16 1019  BP: (!) 161/61 (!) 160/57 (!) 173/61 (!) 177/66  Pulse: 65 67 70 67  Resp: 16 18 18    Temp: 99.1 F (37.3 C) 99.6 F (37.6 C) 99.8 F (37.7 C)   TempSrc:   Oral   SpO2: 100% 100% 100%   Weight: 68.5 kg (151 lb)     Height:        Intake/Output Summary (Last 24 hours) at 06/17/16 1301 Last data filed at 06/17/16 1058  Gross per 24 hour  Intake              540  ml  Output              250 ml  Net              290 ml   Filed Weights   06/16/16 0700 06/16/16 1020 06/16/16 2020  Weight: 67.9 kg (149 lb 11.1 oz) 67.9 kg (149 lb  11.1 oz) 68.5 kg (151 lb)    Examination:  General exam:Not in distress, lying on bed comfortable  Respiratory system: Clear bilateral. Respiratory effort normal. No wheezing or crackle Cardiovascular system: S1 & S2 heard, RRR.  No pedal edema. Gastrointestinal system: Abdomen soft, nontender, nondistended. Bowel sound positive Central nervous system: Alert and oriented. No focal neurological deficits. Extremities: Symmetric 5 x 5 power. Skin: No rashes, lesions or ulcers Psychiatry: Judgement and insight appear normal. Mood & affect appropriate.     Data Reviewed: I have personally reviewed following labs and imaging studies  CBC:  Recent Labs Lab 06/13/16 0622 06/14/16 1637 06/15/16 0502 06/16/16 0741 06/16/16 0828  WBC 18.4* 21.4* 21.8* 13.1*  --   NEUTROABS 15.9* 19.2* 18.6*  --   --   HGB 6.9* 8.2* 8.0* 6.7* 7.1*  HCT 21.4* 25.5* 25.0* 21.2* 21.9*  MCV 94.7 93.8 94.0 95.9  --   PLT 382 311 313 230  --    Basic Metabolic Panel:  Recent Labs Lab 06/13/16 0622 06/14/16 1637 06/15/16 0502 06/16/16 0412 06/16/16 0741  NA 134* 134* 135 134*  --   K 3.2* 3.6 3.4* 3.2*  --   CL 96* 98* 99* 101  --   CO2 21* 22 26 25   --   GLUCOSE 104* 192* 157* 115*  --   BUN 153* 68* 20 22*  --   CREATININE 14.57* 8.75* 4.25* 5.50*  --   CALCIUM 7.4* 7.3* 7.3* 7.2*  --   PHOS  --   --  2.0*  --  2.7   GFR: Estimated Creatinine Clearance: 10.3 mL/min (by C-G formula based on SCr of 5.5 mg/dL (H)). Liver Function Tests:  Recent Labs Lab 06/13/16 0622 06/13/16 1638 06/15/16 0502  AST 11*  --   --   ALT 11* 10*  --   ALKPHOS 97  --   --   BILITOT 1.5*  --   --   PROT 5.8*  --   --   ALBUMIN 2.3*  --  2.1*   No results for input(s): LIPASE, AMYLASE in the last 168 hours. No results for input(s): AMMONIA in the last 168 hours. Coagulation Profile: No results for input(s): INR, PROTIME in the last 168 hours. Cardiac Enzymes: No results for input(s): CKTOTAL, CKMB,  CKMBINDEX, TROPONINI in the last 168 hours. BNP (last 3 results) No results for input(s): PROBNP in the last 8760 hours. HbA1C: No results for input(s): HGBA1C in the last 72 hours. CBG:  Recent Labs Lab 06/16/16 1114 06/16/16 1710 06/16/16 2123 06/17/16 0741 06/17/16 1134  GLUCAP 109* 141* 146* 108* 127*   Lipid Profile: No results for input(s): CHOL, HDL, LDLCALC, TRIG, CHOLHDL, LDLDIRECT in the last 72 hours. Thyroid Function Tests: No results for input(s): TSH, T4TOTAL, FREET4, T3FREE, THYROIDAB in the last 72 hours. Anemia Panel: No results for input(s): VITAMINB12, FOLATE, FERRITIN, TIBC, IRON, RETICCTPCT in the last 72 hours. Sepsis Labs: No results for input(s): PROCALCITON, LATICACIDVEN in the last 168 hours.  Recent Results (from the past 240 hour(s))  Culture, blood (routine x 2)     Status: None (Preliminary result)  Collection Time: 06/16/16 12:45 PM  Result Value Ref Range Status   Specimen Description BLOOD LEFT HAND  Final   Special Requests IN PEDIATRIC BOTTLE 1CC  Final   Culture NO GROWTH < 24 HOURS  Final   Report Status PENDING  Incomplete  Culture, blood (routine x 2)     Status: None (Preliminary result)   Collection Time: 06/16/16 12:58 PM  Result Value Ref Range Status   Specimen Description BLOOD LEFT HAND  Final   Special Requests BOTTLES DRAWN AEROBIC AND ANAEROBIC 5CC  Final   Culture NO GROWTH < 24 HOURS  Final   Report Status PENDING  Incomplete         Radiology Studies: Ct Abdomen Pelvis W Wo Contrast  Result Date: 06/15/2016 CLINICAL DATA:  Renal mass on ultrasound. EXAM: CT ABDOMEN AND PELVIS WITHOUT AND WITH CONTRAST TECHNIQUE: Multidetector CT imaging of the abdomen and pelvis was performed following the standard protocol before and following the bolus administration of intravenous contrast. CONTRAST:  144mL ISOVUE-300 IOPAMIDOL (ISOVUE-300) INJECTION 61% COMPARISON:  Must cell a Sim, ultrasound exam 06/14/2016 FINDINGS: Lower  chest:  Unremarkable. Hepatobiliary: No focal abnormality within the liver parenchyma. Gallbladder surgically absent. No intrahepatic or extrahepatic biliary dilation. Pancreas: No focal mass lesion. No dilatation of the main duct. No intraparenchymal cyst. No peripancreatic edema. Spleen: No splenomegaly. No focal mass lesion. Adrenals/Urinary Tract: No adrenal nodule or mass. Wall thickening is noted in the right renal pelvis (see image 42 series 13. No discrete intraparenchymal mass lesion identified within the right kidney. Left kidney demonstrates heterogeneous enhancement mainly in the upper pole and interpolar region and there is prominent left perinephric edema/inflammation. A multiloculated 4.0 x 2.0 x 3.7 cm rim enhancing fluid collections identified anterior to the interpolar left kidney and tracks down towards the lower pole. This is associated with wall thickening in the renal pelvis and renal calices of the left kidney. Contrast excretion is delayed from both kidneys. No evidence for hydroureter. Circumferential bladder wall thickening noted. Gas in the urinary bladder may be related to recent instrumentation or infection. Stomach/Bowel: Stomach is nondistended. No gastric wall thickening. No evidence of outlet obstruction. Duodenum is normally positioned as is the ligament of Treitz. No small bowel wall thickening. No small bowel dilatation. The terminal ileum is normal. The appendix is not visualized, but there is no edema or inflammation in the region of the cecum. Diverticular changes are noted in the left colon without evidence of diverticulitis. Vascular/Lymphatic: There is abdominal aortic atherosclerosis without aneurysm. Small lymph nodes identified gastrohepatic and hepatoduodenal ligaments. Small left para-aortic lymph nodes are identified and measure up to 9 mm short axis which is borderline increased. No pelvic sidewall lymphadenopathy. Reproductive: Uterus surgically absent.  There is no  adnexal mass. Other: No intraperitoneal free fluid. Musculoskeletal: Midline fat show laxity evident. Bone windows reveal no worrisome lytic or sclerotic osseous lesions. IMPRESSION: 1. Wall thickening identified and the renal pelvis of each kidney with probable caliceal wall thickening in the left kidney. Changes are associated with circumferential bladder wall thickening and gas in the bladder lumen. Given bilateral involvement, infectious etiology favored over other possibilities such as urothelial neoplasm. 2. Heterogeneous perfusion left kidney likely related to infection/pyelonephritis. 3. 4.0 x 2.0 x 3.7 cm rim enhancing multiloculated fluid collection identified in the perinephric fat anterior to the left kidney. Perinephric abscess could have this appearance. Given delayed contrast excretion, the left intrarenal collecting system and renal pelvis is not opacified despite repeat delayed  imaging, and urinoma cannot be entirely excluded. 4. Borderline retroperitoneal lymphadenopathy in the abdomen. Electronically Signed   By: Misty Stanley M.D.   On: 06/15/2016 18:42        Scheduled Meds: . sodium chloride   Intravenous Once  . amLODipine  10 mg Oral Daily  . calcium carbonate  1 tablet Oral QAC supper  . ceFEPime (MAXIPIME) IV  1 g Intravenous QHS  . darbepoetin (ARANESP) injection - DIALYSIS  150 mcg Intravenous Q Mon-HD  . enoxaparin (LOVENOX) injection  30 mg Subcutaneous Daily  . famotidine  20 mg Oral QHS  . insulin aspart  0-9 Units Subcutaneous TID WC  . labetalol  400 mg Oral BID   Continuous Infusions:   LOS: 5 days    Atarah Cadogan Tanna Furry, MD [facility] Pager [phone]  If 7PM-7AM, please contact night-coverage [email or web address] Password [email or web address] 06/17/2016, 1:01 PM

## 2016-06-18 DIAGNOSIS — D631 Anemia in chronic kidney disease: Secondary | ICD-10-CM

## 2016-06-18 LAB — CBC
HEMATOCRIT: 31.2 % — AB (ref 36.0–46.0)
HEMOGLOBIN: 9.9 g/dL — AB (ref 12.0–15.0)
MCH: 29.7 pg (ref 26.0–34.0)
MCHC: 31.7 g/dL (ref 30.0–36.0)
MCV: 93.7 fL (ref 78.0–100.0)
Platelets: 191 10*3/uL (ref 150–400)
RBC: 3.33 MIL/uL — ABNORMAL LOW (ref 3.87–5.11)
RDW: 16.6 % — AB (ref 11.5–15.5)
WBC: 16.3 10*3/uL — ABNORMAL HIGH (ref 4.0–10.5)

## 2016-06-18 LAB — RENAL FUNCTION PANEL
ALBUMIN: 2 g/dL — AB (ref 3.5–5.0)
Anion gap: 11 (ref 5–15)
BUN: 16 mg/dL (ref 6–20)
CALCIUM: 7.3 mg/dL — AB (ref 8.9–10.3)
CO2: 24 mmol/L (ref 22–32)
Chloride: 98 mmol/L — ABNORMAL LOW (ref 101–111)
Creatinine, Ser: 5.05 mg/dL — ABNORMAL HIGH (ref 0.44–1.00)
GFR calc Af Amer: 10 mL/min — ABNORMAL LOW (ref >60)
GFR calc non Af Amer: 9 mL/min — ABNORMAL LOW (ref >60)
Glucose, Bld: 95 mg/dL (ref 65–99)
PHOSPHORUS: 2.8 mg/dL (ref 2.5–4.6)
POTASSIUM: 3.3 mmol/L — AB (ref 3.5–5.1)
Sodium: 133 mmol/L — ABNORMAL LOW (ref 135–145)

## 2016-06-18 LAB — GLUCOSE, CAPILLARY
GLUCOSE-CAPILLARY: 91 mg/dL (ref 65–99)
GLUCOSE-CAPILLARY: 158 mg/dL — AB (ref 65–99)
GLUCOSE-CAPILLARY: 198 mg/dL — AB (ref 65–99)

## 2016-06-18 LAB — URINE CULTURE: Culture: NO GROWTH

## 2016-06-18 MED ORDER — HEPARIN SODIUM (PORCINE) 1000 UNIT/ML DIALYSIS: 20.0000 [IU]/kg | INTRAMUSCULAR | Status: DC | PRN

## 2016-06-18 NOTE — Progress Notes (Signed)
Accepted at [place] Start Date and Time: Mon. Feb. 26 th at 11:00 1st treatment . Arrival time after will be 11:55 Mon,Wed and Fri 2nd shift.

## 2016-06-18 NOTE — Progress Notes (Addendum)
PROGRESS NOTE    Alison Baker  GLO:[id number] DOB: 08/21/1955 DOA: 06/12/2016 PCP: Charletta Cousin, MD   Brief Narrative: 61 y.o.femalewith PMH of anemia renal disease, chronic renal disease, PUD, GERD, hypertension, prediabetes who is transferred from [facility] after presenting there with complaints of diarrhea, nausea and emesis for the past 3 weeks. Patient reported flu 3 weeks before this admission.  Creatinine at [facility] 14 and Ct with perinephric abscess Imaging at [facility]:CT scan of the abdomen and pelvis shows minimal bilateral hydronephrosis without obstructing stone, but with more prominent left-sided perinephric stranding and fluid. There is wall thickening of both renal pelvises. Findings are concerning for bilateral pyelonephritis and left-sided ureteritis. Prominent retroperitoneal nodes. 4.3X3.0X2.3 evolving abscess in left kidney  Assessment & Plan:  # Acute kidney injury on chronic kidney disease stage 5, New ESRD -followed by Dr.Goldsborough at [place] -Initiated hemodialysis treatment for uremia on 2/16.  -Plan for tunneled catheter when infection better -outpatient hemodialysis set up in [place] -afebrile, blood cx from 2/19 NGTD   #Pyelonephritis and left perinephric abscess:  -CT 2/15 at [place] notable for 4.3x3x2.3cm serpinginous fluid collection anterior to L renal pelvis concerning for perirenal abscess, Urine Cx result from [facility], the culture growing Escherichia coli and group B streptococcus sensitive to ceftriaxone/levaquin/cefazolin -now on IV cefazolin, clinically improving  -CT 2/18: 4.0 x 2.0 x 3.7 cm rim enhancing multiloculated fluid collection identified in the perinephric fat anterior to the left kidney -Interventional radiology was consulted on 2/19 for possible drainage of the abscesses. As per IR it is difficult location for drainage or even aspiration of fluid collection. They recommended to continue antibiotics and  repeat imaging studies in a few days.  -I called and discussed with Urology Dr.McDiarmid who agreed with Abx Rx and recommended d/w ID regarding duration of Abx and FU with Urology in few months and imaging in 3-67months since radiographically could take a very long time to resolve -I called and discussed with ID Dr.Snider who recommended 3 more weeks of IV Ancef with HD and could be ok to place HD catheter as long as WBC trending down  #Essential hypertension: - Continue amlodipine, labetalol,  hydralazine.  #Anemia of chronic kidney disease: Received 2 units of red blood cell transfusion during hemodialysis on 2/19. ESA and IV iron during dialysis as per nephrologist. Monitor hemoglobin.  #Acid reflux: Continue Pepcid  # Nausea vomiting:  -due to #1 and 2, resolved  DVT prophylaxis: Lovenox subcutaneous Code Status: Full code Family Communication:  Seen on HD, none at bedside. Disposition Plan: home in 2-3days  Consultants:   Nephrology  Interventional radiology  Procedures: Renal ultrasound Antimicrobials: Vanc- 2/16 >> 2/17 - Cefepime- 2/16 >>2/19 -ancef 2/20  Subjective: Patient was seen and examined at hemodialysis unit. Feels ok, no flank/abd discomfort etc  Objective: Vitals:   06/18/16 0900 06/18/16 0930 06/18/16 1000 06/18/16 1038  BP: (!) 154/69 (!) 153/69 (!) 154/69 (!) 169/78  Pulse: 61 64 61 69  Resp: 20 19 (!) 21 20  Temp:    98.9 F (37.2 C)  TempSrc:    Oral  SpO2:    100%  Weight:    69 kg (152 lb 1.9 oz)  Height:        Intake/Output Summary (Last 24 hours) at 06/18/16 1345 Last data filed at 06/18/16 1038  Gross per 24 hour  Intake              360 ml  Output  926 ml  Net             -566 ml   Filed Weights   06/17/16 2004 06/18/16 0719 06/18/16 1038  Weight: 69.9 kg (154 lb) 70 kg (154 lb 5.2 oz) 69 kg (152 lb 1.9 oz)    Examination:  General exam:Not in distress, lying on bed comfortable  Respiratory system: Clear  bilateral. Respiratory effort normal. No wheezing or crackle Cardiovascular system: S1 & S2 heard, RRR.  No pedal edema. Gastrointestinal system: Abdomen soft, nontender, nondistended. Bowel sound positive Central nervous system: Alert and oriented. No focal neurological deficits. Extremities: Symmetric 5 x 5 power. Skin: No rashes, lesions or ulcers Psychiatry: Judgement and insight appear normal. Mood & affect appropriate.     Data Reviewed: I have personally reviewed following labs and imaging studies  CBC:  Recent Labs Lab 06/13/16 0622 06/14/16 1637 06/15/16 0502 06/16/16 0741 06/16/16 0828 06/18/16 0409  WBC 18.4* 21.4* 21.8* 13.1*  --  16.3*  NEUTROABS 15.9* 19.2* 18.6*  --   --   --   HGB 6.9* 8.2* 8.0* 6.7* 7.1* 9.9*  HCT 21.4* 25.5* 25.0* 21.2* 21.9* 31.2*  MCV 94.7 93.8 94.0 95.9  --  93.7  PLT 382 311 313 230  --  983   Basic Metabolic Panel:  Recent Labs Lab 06/13/16 0622 06/14/16 1637 06/15/16 0502 06/16/16 0412 06/16/16 0741 06/18/16 0409  NA 134* 134* 135 134*  --  133*  K 3.2* 3.6 3.4* 3.2*  --  3.3*  CL 96* 98* 99* 101  --  98*  CO2 21* 22 26 25   --  24  GLUCOSE 104* 192* 157* 115*  --  95  BUN 153* 68* 20 22*  --  16  CREATININE 14.57* 8.75* 4.25* 5.50*  --  5.05*  CALCIUM 7.4* 7.3* 7.3* 7.2*  --  7.3*  PHOS  --   --  2.0*  --  2.7 2.8   GFR: Estimated Creatinine Clearance: 11.3 mL/min (by C-G formula based on SCr of 5.05 mg/dL (H)). Liver Function Tests:  Recent Labs Lab 06/13/16 0622 06/13/16 1638 06/15/16 0502 06/18/16 0409  AST 11*  --   --   --   ALT 11* 10*  --   --   ALKPHOS 97  --   --   --   BILITOT 1.5*  --   --   --   PROT 5.8*  --   --   --   ALBUMIN 2.3*  --  2.1* 2.0*   No results for input(s): LIPASE, AMYLASE in the last 168 hours. No results for input(s): AMMONIA in the last 168 hours. Coagulation Profile: No results for input(s): INR, PROTIME in the last 168 hours. Cardiac Enzymes: No results for input(s):  CKTOTAL, CKMB, CKMBINDEX, TROPONINI in the last 168 hours. BNP (last 3 results) No results for input(s): PROBNP in the last 8760 hours. HbA1C: No results for input(s): HGBA1C in the last 72 hours. CBG:  Recent Labs Lab 06/17/16 0741 06/17/16 1134 06/17/16 1713 06/17/16 2156 06/18/16 1144  GLUCAP 108* 127* 164* 176* 91   Lipid Profile: No results for input(s): CHOL, HDL, LDLCALC, TRIG, CHOLHDL, LDLDIRECT in the last 72 hours. Thyroid Function Tests: No results for input(s): TSH, T4TOTAL, FREET4, T3FREE, THYROIDAB in the last 72 hours. Anemia Panel: No results for input(s): VITAMINB12, FOLATE, FERRITIN, TIBC, IRON, RETICCTPCT in the last 72 hours. Sepsis Labs: No results for input(s): PROCALCITON, LATICACIDVEN in the last 168 hours.  Recent  Results (from the past 240 hour(s))  Culture, blood (routine x 2)     Status: None (Preliminary result)   Collection Time: 06/16/16 12:45 PM  Result Value Ref Range Status   Specimen Description BLOOD LEFT HAND  Final   Special Requests IN PEDIATRIC BOTTLE 1CC  Final   Culture NO GROWTH 2 DAYS  Final   Report Status PENDING  Incomplete  Culture, blood (routine x 2)     Status: None (Preliminary result)   Collection Time: 06/16/16 12:58 PM  Result Value Ref Range Status   Specimen Description BLOOD LEFT HAND  Final   Special Requests BOTTLES DRAWN AEROBIC AND ANAEROBIC 5CC  Final   Culture NO GROWTH 2 DAYS  Final   Report Status PENDING  Incomplete  Culture, Urine     Status: None   Collection Time: 06/17/16  8:41 AM  Result Value Ref Range Status   Specimen Description URINE, RANDOM  Final   Special Requests NONE  Final   Culture NO GROWTH  Final   Report Status 06/18/2016 FINAL  Final         Radiology Studies: No results found.      Scheduled Meds: . sodium chloride   Intravenous Once  . amLODipine  10 mg Oral Daily  . calcium carbonate  1 tablet Oral QAC supper  .  ceFAZolin (ANCEF) IV  1 g Intravenous Q24H  .  darbepoetin (ARANESP) injection - DIALYSIS  150 mcg Intravenous Q Mon-HD  . enoxaparin (LOVENOX) injection  30 mg Subcutaneous Daily  . famotidine  20 mg Oral QHS  . hydrALAZINE  25 mg Oral Q8H  . labetalol  400 mg Oral BID   Continuous Infusions:   LOS: 6 days    Domenic Polite, MD [facility] Pager [phone]  If 7PM-7AM, please contact night-coverage [email or web address] Password [email or web address] 06/18/2016, 1:45 PM

## 2016-06-18 NOTE — Procedures (Signed)
I was present at this dialysis session. I have reviewed the session itself and made appropriate changes.   Filed Weights   06/16/16 2020 06/17/16 2004 06/18/16 0719  Weight: 68.5 kg (151 lb) 69.9 kg (154 lb) 70 kg (154 lb 5.2 oz)     Recent Labs Lab 06/18/16 0409  NA 133*  K 3.3*  CL 98*  CO2 24  GLUCOSE 95  BUN 16  CREATININE 5.05*  CALCIUM 7.3*  PHOS 2.8     Recent Labs Lab 06/13/16 0622 06/14/16 1637 06/15/16 0502 06/16/16 0741 06/16/16 0828 06/18/16 0409  WBC 18.4* 21.4* 21.8* 13.1*  --  16.3*  NEUTROABS 15.9* 19.2* 18.6*  --   --   --   HGB 6.9* 8.2* 8.0* 6.7* 7.1* 9.9*  HCT 21.4* 25.5* 25.0* 21.2* 21.9* 31.2*  MCV 94.7 93.8 94.0 95.9  --  93.7  PLT 382 311 313 230  --  191    Scheduled Meds: . sodium chloride   Intravenous Once  . amLODipine  10 mg Oral Daily  . calcium carbonate  1 tablet Oral QAC supper  .  ceFAZolin (ANCEF) IV  1 g Intravenous Q24H  . darbepoetin (ARANESP) injection - DIALYSIS  150 mcg Intravenous Q Mon-HD  . enoxaparin (LOVENOX) injection  30 mg Subcutaneous Daily  . famotidine  20 mg Oral QHS  . hydrALAZINE  25 mg Oral Q8H  . labetalol  400 mg Oral BID   Continuous Infusions: PRN Meds:.acetaminophen, diphenhydrAMINE, hydrALAZINE, promethazine, sodium chloride flush   Donetta Potts,  MD 06/18/2016, 9:16 AM

## 2016-06-19 DIAGNOSIS — G43A0 Cyclical vomiting, not intractable: Secondary | ICD-10-CM

## 2016-06-19 DIAGNOSIS — N12 Tubulo-interstitial nephritis, not specified as acute or chronic: Secondary | ICD-10-CM

## 2016-06-19 DIAGNOSIS — N151 Renal and perinephric abscess: Secondary | ICD-10-CM

## 2016-06-19 LAB — CBC
HEMATOCRIT: 30.6 % — AB (ref 36.0–46.0)
Hemoglobin: 9.6 g/dL — ABNORMAL LOW (ref 12.0–15.0)
MCH: 29.9 pg (ref 26.0–34.0)
MCHC: 31.4 g/dL (ref 30.0–36.0)
MCV: 95.3 fL (ref 78.0–100.0)
PLATELETS: 166 10*3/uL (ref 150–400)
RBC: 3.21 MIL/uL — ABNORMAL LOW (ref 3.87–5.11)
RDW: 16.1 % — AB (ref 11.5–15.5)
WBC: 13.2 10*3/uL — AB (ref 4.0–10.5)

## 2016-06-19 LAB — GLUCOSE, CAPILLARY
GLUCOSE-CAPILLARY: 141 mg/dL — AB (ref 65–99)
GLUCOSE-CAPILLARY: 122 mg/dL — AB (ref 65–99)
GLUCOSE-CAPILLARY: 185 mg/dL — AB (ref 65–99)
Glucose-Capillary: 95 mg/dL (ref 65–99)

## 2016-06-19 MED ORDER — CEFAZOLIN SODIUM-DEXTROSE 2-4 GM/100ML-% IV SOLN
2.0000 g | INTRAVENOUS | Status: DC
  Administered 2016-06-20: 2 g via INTRAVENOUS
  Filled 2016-06-19 (×2): qty 100

## 2016-06-19 NOTE — Progress Notes (Signed)
Pharmacy Antibiotic Note  Kemia Wendel is a 61 y.o. female with h/o ESRD not on HD transferred from [facility] on 06/12/2016 with pyelonephritis/perirenal abcess.  Pt new start HD this admission.  Has been accepted to MWF schedule outpatient.   2/14: urine from [facility]: > 100K E coli, sensitive to all X ampicillin and Unasyn > 100K strep agalactaie/group B. Sensitive to vanc, CTX, PCN, amp, cefotaxime, Lvq, zyvox Both E Coli and group B strep are covered by cefepime.  Could narrow to cefazolin.   Plan:  Currently on Ancef 1gm IV q24h with original stop date of 2/25.  I have adjusted the order to Ancef 2gm IV MWF following dialysis for ease of outpatient administration.  I have also added the stop date of 07/09/16 allowing for 3 more weeks of therapy per ID recommendations.  [employer], Pharm.D., BCPS Clinical Pharmacist Pager [phone] 06/19/2016 11:42 AM

## 2016-06-19 NOTE — Progress Notes (Signed)
PROGRESS NOTE    Alison Baker  OJJ:[id number] DOB: 01/14/1956 DOA: 06/12/2016 PCP: Charletta Cousin, MD   Brief Narrative: 61 y.o.femalewith PMH of anemia renal disease, chronic renal disease, PUD, GERD, hypertension, prediabetes who is transferred from [facility] after presenting there with complaints of diarrhea, nausea and emesis for the past 3 weeks. Patient reported flu 3 weeks before this admission.  Creatinine at [facility] 14 and Ct with perinephric abscess Imaging at [facility]:CT scan of the abdomen and pelvis shows minimal bilateral hydronephrosis without obstructing stone, but with more prominent left-sided perinephric stranding and fluid. There is wall thickening of both renal pelvises. Findings are concerning for bilateral pyelonephritis and left-sided ureteritis. Prominent retroperitoneal nodes. 4.3X3.0X2.3 evolving abscess in left kidney  Assessment & Plan:  # Acute kidney injury on chronic kidney disease stage 5, New ESRD -followed by Dr.Golds[place] at [place] -Initiated hemodialysis treatment for uremia on 2/16.  -Plan for tunneled catheter soon-see discussion below -outpatient hemodialysis set up in [place] -afebrile, blood cx from 2/19 NGTD  -WBC trending back down again 21->13->16.3->13 now At this time I would be ok with tunneled HD catheter   #Pyelonephritis and left perinephric abscess:  -CT 2/15 at [place] notable for 4.3x3x2.3cm serpinginous fluid collection anterior to L renal pelvis concerning for perirenal abscess, Urine Cx result from [facility], the culture growing Escherichia coli and group B streptococcus sensitive to ceftriaxone/levaquin/cefazolin -now on IV cefazolin, clinically improving  -CT 2/18: 4.0 x 2.0 x 3.7 cm rim enhancing multiloculated fluid collection identified in the perinephric fat anterior to the left kidney -Interventional radiology was consulted on 2/19 for possible drainage of the abscesses. As per IR it is  difficult location for drainage or even aspiration of fluid collection. They recommended to continue antibiotics and repeat imaging studies in a few days.  -I called and discussed with Urology Dr.McDiarmid 2/21 who agreed with Abx Rx and recommended d/w ID regarding duration of Abx and FU with Urology in few months and imaging in 3-48months since radiographically could take a very long time to resolve -I called and discussed with ID Dr.Snider 2/21 who recommended 3 more weeks of IV Ancef with HD and could be ok to place HD catheter as long as WBC trending down  #Essential hypertension: - Continue amlodipine, labetalol,  hydralazine.  #Anemia of chronic kidney disease: Received 2 units of red blood cell transfusion during hemodialysis on 2/19. ESA and IV iron during dialysis as per nephrologist. -hb stable now  #Acid reflux: Continue Pepcid  # Nausea vomiting:  -due to #1 and 2, resolved  DVT prophylaxis: Lovenox subcutaneous Code Status: Full code Family Communication:  spouse at bedside. Disposition Plan: home in few days  Consultants:   Nephrology  Interventional radiology  Procedures: Renal ultrasound Antimicrobials: Vanc- 2/16 >> 2/17 - Cefepime- 2/16 >>2/19 -ancef 2/20  Subjective: Patient was seen and examined at hemodialysis unit. Feels ok, no flank/abd discomfort etc  Objective: Vitals:   06/18/16 1757 06/18/16 2118 06/19/16 0453 06/19/16 0905  BP: (!) 144/61 (!) 149/56 (!) 153/60 (!) 150/63  Pulse: 60 64 63 67  Resp: 18 19 20 18   Temp: 98.8 F (37.1 C) 98.6 F (37 C) 98.3 F (36.8 C) 98.4 F (36.9 C)  TempSrc: Oral Oral Oral Oral  SpO2: 100% 100% 98% 98%  Weight:  69 kg (152 lb 3.2 oz)    Height:        Intake/Output Summary (Last 24 hours) at 06/19/16 1334 Last data filed at 06/19/16 1046  Gross per 24 hour  Intake              550 ml  Output              300 ml  Net              250 ml   Filed Weights   06/18/16 0719 06/18/16 1038 06/18/16 2118    Weight: 70 kg (154 lb 5.2 oz) 69 kg (152 lb 1.9 oz) 69 kg (152 lb 3.2 oz)    Examination:  General exam:AAOx3, no distress, lying on bed comfortable  HEENT: TLC in neck Respiratory system: Clear bilateral. Respiratory effort normal. No wheezing or crackles Cardiovascular system: S1 & S2 heard, RRR.  No pedal edema. Gastrointestinal system: Abdomen soft, nontender, nondistended. Bowel sound positive Central nervous system: Alert and oriented. No focal neurological deficits. Extremities: Symmetric 5 x 5 power. Skin: No rashes, lesions or ulcers Psychiatry: Judgement and insight appear normal. Mood & affect appropriate.     Data Reviewed: I have personally reviewed following labs and imaging studies  CBC:  Recent Labs Lab 06/13/16 0622 06/14/16 1637 06/15/16 0502 06/16/16 0741 06/16/16 0828 06/18/16 0409 06/19/16 1045  WBC 18.4* 21.4* 21.8* 13.1*  --  16.3* 13.2*  NEUTROABS 15.9* 19.2* 18.6*  --   --   --   --   HGB 6.9* 8.2* 8.0* 6.7* 7.1* 9.9* 9.6*  HCT 21.4* 25.5* 25.0* 21.2* 21.9* 31.2* 30.6*  MCV 94.7 93.8 94.0 95.9  --  93.7 95.3  PLT 382 311 313 230  --  191 284   Basic Metabolic Panel:  Recent Labs Lab 06/13/16 0622 06/14/16 1637 06/15/16 0502 06/16/16 0412 06/16/16 0741 06/18/16 0409  NA 134* 134* 135 134*  --  133*  K 3.2* 3.6 3.4* 3.2*  --  3.3*  CL 96* 98* 99* 101  --  98*  CO2 21* 22 26 25   --  24  GLUCOSE 104* 192* 157* 115*  --  95  BUN 153* 68* 20 22*  --  16  CREATININE 14.57* 8.75* 4.25* 5.50*  --  5.05*  CALCIUM 7.4* 7.3* 7.3* 7.2*  --  7.3*  PHOS  --   --  2.0*  --  2.7 2.8   GFR: Estimated Creatinine Clearance: 11.3 mL/min (by C-G formula based on SCr of 5.05 mg/dL (H)). Liver Function Tests:  Recent Labs Lab 06/13/16 0622 06/13/16 1638 06/15/16 0502 06/18/16 0409  AST 11*  --   --   --   ALT 11* 10*  --   --   ALKPHOS 97  --   --   --   BILITOT 1.5*  --   --   --   PROT 5.8*  --   --   --   ALBUMIN 2.3*  --  2.1* 2.0*   No  results for input(s): LIPASE, AMYLASE in the last 168 hours. No results for input(s): AMMONIA in the last 168 hours. Coagulation Profile: No results for input(s): INR, PROTIME in the last 168 hours. Cardiac Enzymes: No results for input(s): CKTOTAL, CKMB, CKMBINDEX, TROPONINI in the last 168 hours. BNP (last 3 results) No results for input(s): PROBNP in the last 8760 hours. HbA1C: No results for input(s): HGBA1C in the last 72 hours. CBG:  Recent Labs Lab 06/18/16 1144 06/18/16 1630 06/18/16 2115 06/19/16 0729 06/19/16 1158  GLUCAP 91 158* 198* 95 141*   Lipid Profile: No results for input(s): CHOL, HDL, LDLCALC, TRIG, CHOLHDL, LDLDIRECT  in the last 72 hours. Thyroid Function Tests: No results for input(s): TSH, T4TOTAL, FREET4, T3FREE, THYROIDAB in the last 72 hours. Anemia Panel: No results for input(s): VITAMINB12, FOLATE, FERRITIN, TIBC, IRON, RETICCTPCT in the last 72 hours. Sepsis Labs: No results for input(s): PROCALCITON, LATICACIDVEN in the last 168 hours.  Recent Results (from the past 240 hour(s))  Culture, blood (routine x 2)     Status: None (Preliminary result)   Collection Time: 06/16/16 12:45 PM  Result Value Ref Range Status   Specimen Description BLOOD LEFT HAND  Final   Special Requests IN PEDIATRIC BOTTLE 1CC  Final   Culture NO GROWTH 2 DAYS  Final   Report Status PENDING  Incomplete  Culture, blood (routine x 2)     Status: None (Preliminary result)   Collection Time: 06/16/16 12:58 PM  Result Value Ref Range Status   Specimen Description BLOOD LEFT HAND  Final   Special Requests BOTTLES DRAWN AEROBIC AND ANAEROBIC 5CC  Final   Culture NO GROWTH 2 DAYS  Final   Report Status PENDING  Incomplete  Culture, Urine     Status: None   Collection Time: 06/17/16  8:41 AM  Result Value Ref Range Status   Specimen Description URINE, RANDOM  Final   Special Requests NONE  Final   Culture NO GROWTH  Final   Report Status 06/18/2016 FINAL  Final          Radiology Studies: No results found.      Scheduled Meds: . sodium chloride   Intravenous Once  . amLODipine  10 mg Oral Daily  . calcium carbonate  1 tablet Oral QAC supper  .  ceFAZolin (ANCEF) IV  1 g Intravenous Q24H  . [START ON 06/20/2016]  ceFAZolin (ANCEF) IV  2 g Intravenous Q M,W,F-1800  . darbepoetin (ARANESP) injection - DIALYSIS  150 mcg Intravenous Q Mon-HD  . famotidine  20 mg Oral QHS  . hydrALAZINE  25 mg Oral Q8H  . labetalol  400 mg Oral BID   Continuous Infusions:   LOS: 7 days    Domenic Polite, MD [facility] Pager [phone]  If 7PM-7AM, please contact night-coverage [email or web address] Password [email or web address] 06/19/2016, 1:34 PM

## 2016-06-19 NOTE — Progress Notes (Signed)
Patient ID: Alison Baker, female   DOB: 10/17/1955, 61 y.o.   MRN: [id number] S:No complaints O:BP (!) 150/63 (BP Location: Left Arm)   Pulse 67   Temp 98.4 F (36.9 C) (Oral)   Resp 18   Ht 5\' 3"  (1.6 m)   Wt 69 kg (152 lb 3.2 oz)   SpO2 98%   BMI 26.96 kg/m   Intake/Output Summary (Last 24 hours) at 06/19/16 1030 Last data filed at 06/19/16 0900  Gross per 24 hour  Intake              890 ml  Output             1321 ml  Net             -431 ml   Intake/Output: I/O last 3 completed shifts: In: 15 [P.O.:660; IV Piggyback:100] Out: 1601 [Urine:325; Other:846]  Intake/Output this shift:  Total I/O In: 240 [P.O.:240] Out: 150 [Urine:150] Weight change: 0.146 kg (5.2 oz) Gen:WD WN WF in NAd CVS:no rub Resp:cta UXN:ATFTDD Ext:no edema   Recent Labs Lab 06/13/16 0622 06/13/16 1638 06/14/16 1637 06/15/16 0502 06/16/16 0412 06/16/16 0741 06/18/16 0409  NA 134*  --  134* 135 134*  --  133*  K 3.2*  --  3.6 3.4* 3.2*  --  3.3*  CL 96*  --  98* 99* 101  --  98*  CO2 21*  --  22 26 25   --  24  GLUCOSE 104*  --  192* 157* 115*  --  95  BUN 153*  --  68* 20 22*  --  16  CREATININE 14.57*  --  8.75* 4.25* 5.50*  --  5.05*  ALBUMIN 2.3*  --   --  2.1*  --   --  2.0*  CALCIUM 7.4*  --  7.3* 7.3* 7.2*  --  7.3*  PHOS  --   --   --  2.0*  --  2.7 2.8  AST 11*  --   --   --   --   --   --   ALT 11* 10*  --   --   --   --   --    Liver Function Tests:  Recent Labs Lab 06/13/16 0622 06/13/16 1638 06/15/16 0502 06/18/16 0409  AST 11*  --   --   --   ALT 11* 10*  --   --   ALKPHOS 97  --   --   --   BILITOT 1.5*  --   --   --   PROT 5.8*  --   --   --   ALBUMIN 2.3*  --  2.1* 2.0*   No results for input(s): LIPASE, AMYLASE in the last 168 hours. No results for input(s): AMMONIA in the last 168 hours. CBC:  Recent Labs Lab 06/13/16 0622 06/14/16 1637 06/15/16 0502 06/16/16 0741 06/16/16 0828 06/18/16 0409  WBC 18.4* 21.4* 21.8* 13.1*  --  16.3*  NEUTROABS  15.9* 19.2* 18.6*  --   --   --   HGB 6.9* 8.2* 8.0* 6.7* 7.1* 9.9*  HCT 21.4* 25.5* 25.0* 21.2* 21.9* 31.2*  MCV 94.7 93.8 94.0 95.9  --  93.7  PLT 382 311 313 230  --  191   Cardiac Enzymes: No results for input(s): CKTOTAL, CKMB, CKMBINDEX, TROPONINI in the last 168 hours. CBG:  Recent Labs Lab 06/17/16 2156 06/18/16 1144 06/18/16 1630 06/18/16 2115 06/19/16 0729  GLUCAP 176* 91 158*  198* 95    Iron Studies: No results for input(s): IRON, TIBC, TRANSFERRIN, FERRITIN in the last 72 hours. Studies/Results: No results found. . sodium chloride   Intravenous Once  . amLODipine  10 mg Oral Daily  . calcium carbonate  1 tablet Oral QAC supper  .  ceFAZolin (ANCEF) IV  1 g Intravenous Q24H  . darbepoetin (ARANESP) injection - DIALYSIS  150 mcg Intravenous Q Mon-HD  . famotidine  20 mg Oral QHS  . hydrALAZINE  25 mg Oral Q8H  . labetalol  400 mg Oral BID    BMET    Component Value Date/Time   NA 133 (L) 06/18/2016 0409   K 3.3 (L) 06/18/2016 0409   CL 98 (L) 06/18/2016 0409   CO2 24 06/18/2016 0409   GLUCOSE 95 06/18/2016 0409   BUN 16 06/18/2016 0409   CREATININE 5.05 (H) 06/18/2016 0409   CALCIUM 7.3 (L) 06/18/2016 0409   GFRNONAA 9 (L) 06/18/2016 0409   GFRAA 10 (L) 06/18/2016 0409   CBC    Component Value Date/Time   WBC 16.3 (H) 06/18/2016 0409   RBC 3.33 (L) 06/18/2016 0409   HGB 9.9 (L) 06/18/2016 0409   HCT 31.2 (L) 06/18/2016 0409   PLT 191 06/18/2016 0409   MCV 93.7 06/18/2016 0409   MCH 29.7 06/18/2016 0409   MCHC 31.7 06/18/2016 0409   RDW 16.6 (H) 06/18/2016 0409   LYMPHSABS 1.4 06/15/2016 0502   MONOABS 1.4 (H) 06/15/2016 0502   EOSABS 0.3 06/15/2016 0502   BASOSABS 0.1 06/15/2016 0502    Assessment and Plan:  1. Pyelonephritis on vanc/cefepime 2. ESRD new start.  Plan for HD on MWF for now and awaiting outpatient placement in [place].  Has temporary HD cath and will need more permanent access.  Was interested in PD but will need to start  with incenter HD until stabilized. 3. 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 Nutrition: renal diet 6. Hypertension:stable 7. Vascular access- will need tunneled HD cath and AVF/AVG once ID issues addressed.  Temp HD cath placed 06/13/16. 1. Plan for HD tomorrow and then pull temp cath due to rising WBC 2. Will need replacement cath on Monday (either temp or tunneled depending upon WBC)  Donetta Potts, MD [facility] [phone]

## 2016-06-20 LAB — RENAL FUNCTION PANEL
ANION GAP: 8 (ref 5–15)
Albumin: 1.9 g/dL — ABNORMAL LOW (ref 3.5–5.0)
BUN: 16 mg/dL (ref 6–20)
CHLORIDE: 97 mmol/L — AB (ref 101–111)
CO2: 27 mmol/L (ref 22–32)
Calcium: 7.7 mg/dL — ABNORMAL LOW (ref 8.9–10.3)
Creatinine, Ser: 5.19 mg/dL — ABNORMAL HIGH (ref 0.44–1.00)
GFR, EST AFRICAN AMERICAN: 10 mL/min — AB (ref >60)
GFR, EST NON AFRICAN AMERICAN: 8 mL/min — AB (ref >60)
Glucose, Bld: 103 mg/dL — ABNORMAL HIGH (ref 65–99)
Phosphorus: 2.7 mg/dL (ref 2.5–4.6)
Potassium: 3.8 mmol/L (ref 3.5–5.1)
Sodium: 132 mmol/L — ABNORMAL LOW (ref 135–145)

## 2016-06-20 LAB — CBC
HCT: 30.2 % — ABNORMAL LOW (ref 36.0–46.0)
Hemoglobin: 9.6 g/dL — ABNORMAL LOW (ref 12.0–15.0)
MCH: 30 pg (ref 26.0–34.0)
MCHC: 31.8 g/dL (ref 30.0–36.0)
MCV: 94.4 fL (ref 78.0–100.0)
PLATELETS: 193 10*3/uL (ref 150–400)
RBC: 3.2 MIL/uL — ABNORMAL LOW (ref 3.87–5.11)
RDW: 15.8 % — AB (ref 11.5–15.5)
WBC: 14.1 10*3/uL — ABNORMAL HIGH (ref 4.0–10.5)

## 2016-06-20 LAB — GLUCOSE, CAPILLARY
GLUCOSE-CAPILLARY: 102 mg/dL — AB (ref 65–99)
GLUCOSE-CAPILLARY: 141 mg/dL — AB (ref 65–99)
GLUCOSE-CAPILLARY: 170 mg/dL — AB (ref 65–99)

## 2016-06-20 NOTE — Progress Notes (Signed)
PROGRESS NOTE    Alison Baker  MGQ:[id number] DOB: 04-Jun-1955 DOA: 06/12/2016 PCP: Charletta Cousin, MD   Brief Narrative: 61 y.o.femalewith PMH of anemia renal disease, chronic renal disease, PUD, GERD, hypertension, prediabetes who is transferred from [facility] after presenting there with complaints of diarrhea, nausea and emesis for the past 3 weeks. Patient reported flu 3 weeks before this admission.  Creatinine at [facility] 14 and Ct with perinephric abscess Imaging at [facility]:CT scan of the abdomen and pelvis shows minimal bilateral hydronephrosis without obstructing stone, but with more prominent left-sided perinephric stranding and fluid. There is wall thickening of both renal pelvises. Findings are concerning for bilateral pyelonephritis and left-sided ureteritis. Prominent retroperitoneal nodes. 4.3X3.0X2.3 evolving abscess in left kidney  Assessment & Plan:  # Acute kidney injury on chronic kidney disease stage 5, New ESRD -Initiated hemodialysis treatment for uremia on 2/16.  -Plan for HD catheter on Monday -outpatient hemodialysis set up in [place] -afebrile, blood cx from 2/19 NGTD  -WBC trending back down again 21->13->16.3->13->14  #Pyelonephritis and left perinephric abscess:  -CT 2/15 at [place] notable for 4.3x3x2.3cm serpinginous fluid collection anterior to L renal pelvis concerning for perirenal abscess, Urine Cx result from [facility], the culture growing Escherichia coli and group B streptococcus sensitive to ceftriaxone/levaquin/cefazolin -now on IV cefazolin, clinically improving  -CT 2/18: 4.0 x 2.0 x 3.7 cm rim enhancing multiloculated fluid collection identified in the perinephric fat anterior to the left kidney -Interventional radiology was consulted on 2/19 for possible drainage of the abscesses. As per IR it is difficult location for drainage or even aspiration of fluid collection. They recommended to continue antibiotics and  repeat imaging studies in a few days.  -I called and discussed with Urology Dr.McDiarmid 2/21 who agreed with Abx Rx and recommended d/w ID regarding duration of Abx and FU with Urology in few months and imaging in 3-61months since radiographically could take a very long time to resolve -I called and discussed with ID Dr.Snider 2/21 who recommended 3 more weeks of IV Ancef with HD and could be ok to place HD catheter as long as WBC trending down  #Essential hypertension: - Continue amlodipine, labetalol,  Hydralazine. -stable  #Anemia of chronic kidney disease: Received 2 units of red blood cell transfusion during hemodialysis on 2/19. ESA and IV iron during dialysis as per nephrologist. -hb stable now  #Acid reflux: Continue Pepcid  # Nausea vomiting:  -due to #1 and 2, resolved  DVT prophylaxis: Lovenox subcutaneous Code Status: Full code Family Communication:  spouse at bedside. Disposition Plan: home Monday after HD cath placement if [place]  Consultants:   Nephrology  Interventional radiology  Procedures: Renal ultrasound Antimicrobials: Vanc- 2/16 >> 2/17 - Cefepime- 2/16 >>2/19 -ancef 2/20  Subjective: Patient was seen and examined at hemodialysis unit. Feels ok, no flank/abd discomfort etc  Objective: Vitals:   06/20/16 0900 06/20/16 0930 06/20/16 1000 06/20/16 1030  BP: (!) 135/57 127/64 138/63   Pulse: 67 63 63 (P) 68  Resp: 20 20 20  (P) 16  Temp:      TempSrc:      SpO2:      Weight:      Height:        Intake/Output Summary (Last 24 hours) at 06/20/16 1126 Last data filed at 06/20/16 0930  Gross per 24 hour  Intake              240 ml  Output  850 ml  Net             -610 ml   Filed Weights   06/18/16 2118 06/19/16 2109 06/20/16 0715  Weight: 69 kg (152 lb 3.2 oz) 71.5 kg (157 lb 11.2 oz) 71.1 kg (156 lb 12 oz)    Examination:  General exam:AAOx3, no distress, lying on bed comfortable  HEENT: TLC in neck Respiratory system: Clear  bilateral. Respiratory effort normal. No wheezing or crackles Cardiovascular system: S1 & S2 heard, RRR.  No pedal edema. Gastrointestinal system: Abdomen soft, nontender, nondistended. Bowel sound positive Central nervous system: Alert and oriented. No focal neurological deficits. Extremities: Symmetric 5 x 5 power. Skin: No rashes, lesions or ulcers Psychiatry: Judgement and insight appear normal. Mood & affect appropriate.     Data Reviewed: I have personally reviewed following labs and imaging studies  CBC:  Recent Labs Lab 06/14/16 1637 06/15/16 0502 06/16/16 0741 06/16/16 0828 06/18/16 0409 06/19/16 1045 06/20/16 0456  WBC 21.4* 21.8* 13.1*  --  16.3* 13.2* 14.1*  NEUTROABS 19.2* 18.6*  --   --   --   --   --   HGB 8.2* 8.0* 6.7* 7.1* 9.9* 9.6* 9.6*  HCT 25.5* 25.0* 21.2* 21.9* 31.2* 30.6* 30.2*  MCV 93.8 94.0 95.9  --  93.7 95.3 94.4  PLT 311 313 230  --  191 166 828   Basic Metabolic Panel:  Recent Labs Lab 06/14/16 1637 06/15/16 0502 06/16/16 0412 06/16/16 0741 06/18/16 0409 06/20/16 0732  NA 134* 135 134*  --  133* 132*  K 3.6 3.4* 3.2*  --  3.3* 3.8  CL 98* 99* 101  --  98* 97*  CO2 22 26 25   --  24 27  GLUCOSE 192* 157* 115*  --  95 103*  BUN 68* 20 22*  --  16 16  CREATININE 8.75* 4.25* 5.50*  --  5.05* 5.19*  CALCIUM 7.3* 7.3* 7.2*  --  7.3* 7.7*  PHOS  --  2.0*  --  2.7 2.8 2.7   GFR: Estimated Creatinine Clearance: 11.2 mL/min (by C-G formula based on SCr of 5.19 mg/dL (H)). Liver Function Tests:  Recent Labs Lab 06/13/16 1638 06/15/16 0502 06/18/16 0409 06/20/16 0732  ALT 10*  --   --   --   ALBUMIN  --  2.1* 2.0* 1.9*   No results for input(s): LIPASE, AMYLASE in the last 168 hours. No results for input(s): AMMONIA in the last 168 hours. Coagulation Profile: No results for input(s): INR, PROTIME in the last 168 hours. Cardiac Enzymes: No results for input(s): CKTOTAL, CKMB, CKMBINDEX, TROPONINI in the last 168 hours. BNP (last 3  results) No results for input(s): PROBNP in the last 8760 hours. HbA1C: No results for input(s): HGBA1C in the last 72 hours. CBG:  Recent Labs Lab 06/18/16 2115 06/19/16 0729 06/19/16 1158 06/19/16 1646 06/19/16 2107  GLUCAP 198* 95 141* 122* 185*   Lipid Profile: No results for input(s): CHOL, HDL, LDLCALC, TRIG, CHOLHDL, LDLDIRECT in the last 72 hours. Thyroid Function Tests: No results for input(s): TSH, T4TOTAL, FREET4, T3FREE, THYROIDAB in the last 72 hours. Anemia Panel: No results for input(s): VITAMINB12, FOLATE, FERRITIN, TIBC, IRON, RETICCTPCT in the last 72 hours. Sepsis Labs: No results for input(s): PROCALCITON, LATICACIDVEN in the last 168 hours.  Recent Results (from the past 240 hour(s))  Culture, blood (routine x 2)     Status: None (Preliminary result)   Collection Time: 06/16/16 12:45 PM  Result Value  Ref Range Status   Specimen Description BLOOD LEFT HAND  Final   Special Requests IN PEDIATRIC BOTTLE 1CC  Final   Culture NO GROWTH 4 DAYS  Final   Report Status PENDING  Incomplete  Culture, blood (routine x 2)     Status: None (Preliminary result)   Collection Time: 06/16/16 12:58 PM  Result Value Ref Range Status   Specimen Description BLOOD LEFT HAND  Final   Special Requests BOTTLES DRAWN AEROBIC AND ANAEROBIC 5CC  Final   Culture NO GROWTH 4 DAYS  Final   Report Status PENDING  Incomplete  Culture, Urine     Status: None   Collection Time: 06/17/16  8:41 AM  Result Value Ref Range Status   Specimen Description URINE, RANDOM  Final   Special Requests NONE  Final   Culture NO GROWTH  Final   Report Status 06/18/2016 FINAL  Final         Radiology Studies: No results found.      Scheduled Meds: . sodium chloride   Intravenous Once  . amLODipine  10 mg Oral Daily  . calcium carbonate  1 tablet Oral QAC supper  .  ceFAZolin (ANCEF) IV  2 g Intravenous Q M,W,F-1800  . darbepoetin (ARANESP) injection - DIALYSIS  150 mcg Intravenous Q  Mon-HD  . famotidine  20 mg Oral QHS  . hydrALAZINE  25 mg Oral Q8H  . labetalol  400 mg Oral BID   Continuous Infusions:   LOS: 8 days    Domenic Polite, MD [facility] Pager [phone]  If 7PM-7AM, please contact night-coverage [email or web address] Password [facility] 06/20/2016, 11:26 AM

## 2016-06-20 NOTE — Procedures (Signed)
I was present at this dialysis session. I have reviewed the session itself and made appropriate changes.   Filed Weights   06/18/16 2118 06/19/16 2109 06/20/16 0715  Weight: 69 kg (152 lb 3.2 oz) 71.5 kg (157 lb 11.2 oz) 71.1 kg (156 lb 12 oz)     Recent Labs Lab 06/20/16 0732  NA 132*  K 3.8  CL 97*  CO2 27  GLUCOSE 103*  BUN 16  CREATININE 5.19*  CALCIUM 7.7*  PHOS 2.7     Recent Labs Lab 06/14/16 1637 06/15/16 0502  06/18/16 0409 06/19/16 1045 06/20/16 0456  WBC 21.4* 21.8*  < > 16.3* 13.2* 14.1*  NEUTROABS 19.2* 18.6*  --   --   --   --   HGB 8.2* 8.0*  < > 9.9* 9.6* 9.6*  HCT 25.5* 25.0*  < > 31.2* 30.6* 30.2*  MCV 93.8 94.0  < > 93.7 95.3 94.4  PLT 311 313  < > 191 166 193  < > = values in this interval not displayed.  Scheduled Meds: . sodium chloride   Intravenous Once  . amLODipine  10 mg Oral Daily  . calcium carbonate  1 tablet Oral QAC supper  .  ceFAZolin (ANCEF) IV  2 g Intravenous Q M,W,F-1800  . darbepoetin (ARANESP) injection - DIALYSIS  150 mcg Intravenous Q Mon-HD  . famotidine  20 mg Oral QHS  . hydrALAZINE  25 mg Oral Q8H  . labetalol  400 mg Oral BID   Continuous Infusions: PRN Meds:.acetaminophen, diphenhydrAMINE, heparin, hydrALAZINE, promethazine, sodium chloride flush    Assessment and Plan:  1. Pyelonephritis on vanc/cefepime 2. ESRD new start. Plan for HD on MWF for now and awaiting outpatient placement in [place]. Has temporary HD cath and will need more permanent access. Was interested in PD but will need to start with incenter HD until stabilized. 3. 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 Nutrition: renal diet 6. Hypertension:stable 7. Vascular access- will need tunneled HD cath and AVF/AVG once ID issues addressed.  Temp HD cath placed 06/13/16. 1. Plan to remove HD catheter today after HD 2. Will need replacement cath on Monday (either temp or tunneled depending upon WBC)  Donetta Potts,   MD 06/20/2016, 9:10 AM

## 2016-06-21 LAB — CBC
HEMATOCRIT: 29 % — AB (ref 36.0–46.0)
HEMOGLOBIN: 9.2 g/dL — AB (ref 12.0–15.0)
MCH: 30.4 pg (ref 26.0–34.0)
MCHC: 31.7 g/dL (ref 30.0–36.0)
MCV: 95.7 fL (ref 78.0–100.0)
Platelets: 169 10*3/uL (ref 150–400)
RBC: 3.03 MIL/uL — AB (ref 3.87–5.11)
RDW: 15.8 % — ABNORMAL HIGH (ref 11.5–15.5)
WBC: 10.8 10*3/uL — ABNORMAL HIGH (ref 4.0–10.5)

## 2016-06-21 LAB — RENAL FUNCTION PANEL
ANION GAP: 8 (ref 5–15)
Albumin: 1.8 g/dL — ABNORMAL LOW (ref 3.5–5.0)
BUN: 8 mg/dL (ref 6–20)
CHLORIDE: 100 mmol/L — AB (ref 101–111)
CO2: 25 mmol/L (ref 22–32)
Calcium: 7.5 mg/dL — ABNORMAL LOW (ref 8.9–10.3)
Creatinine, Ser: 3.11 mg/dL — ABNORMAL HIGH (ref 0.44–1.00)
GFR calc non Af Amer: 15 mL/min — ABNORMAL LOW (ref >60)
GFR, EST AFRICAN AMERICAN: 18 mL/min — AB (ref >60)
GLUCOSE: 95 mg/dL (ref 65–99)
POTASSIUM: 4.1 mmol/L (ref 3.5–5.1)
Phosphorus: 1.8 mg/dL — ABNORMAL LOW (ref 2.5–4.6)
SODIUM: 133 mmol/L — AB (ref 135–145)

## 2016-06-21 LAB — GLUCOSE, CAPILLARY
GLUCOSE-CAPILLARY: 94 mg/dL (ref 65–99)
GLUCOSE-CAPILLARY: 166 mg/dL — AB (ref 65–99)
Glucose-Capillary: 134 mg/dL — ABNORMAL HIGH (ref 65–99)
Glucose-Capillary: 109 mg/dL — ABNORMAL HIGH (ref 65–99)

## 2016-06-21 LAB — CULTURE, BLOOD (ROUTINE X 2)
CULTURE: NO GROWTH
CULTURE: NO GROWTH

## 2016-06-21 NOTE — Progress Notes (Signed)
Patient ID: Alison Baker, female   DOB: 1956/03/08, 61 y.o.   MRN: [id number] S:feels better O:BP 125/60 (BP Location: Left Arm)   Pulse 71   Temp 98.9 F (37.2 C) (Oral)   Resp 16   Ht 5\' 3"  (1.6 m)   Wt 66.9 kg (147 lb 6.4 oz)   SpO2 98%   BMI 26.11 kg/m   Intake/Output Summary (Last 24 hours) at 06/21/16 0903 Last data filed at 06/21/16 0646  Gross per 24 hour  Intake             1360 ml  Output             1250 ml  Net              110 ml   Intake/Output: I/O last 3 completed shifts: In: 1360 [P.O.:1360] Out: 3267 [Urine:550; Other:1000]  Intake/Output this shift:  No intake/output data recorded. Weight change: -0.432 kg (-15.3 oz) Gen:WD WN WF in NAD CVS:no rub Resp:cta TIW:PYKDXI Ext:no edema    Recent Labs Lab 06/14/16 1637 06/15/16 0502 06/16/16 0412 06/16/16 0741 06/18/16 0409 06/20/16 0732 06/21/16 0513  NA 134* 135 134*  --  133* 132* 133*  K 3.6 3.4* 3.2*  --  3.3* 3.8 4.1  CL 98* 99* 101  --  98* 97* 100*  CO2 22 26 25   --  24 27 25   GLUCOSE 192* 157* 115*  --  95 103* 95  BUN 68* 20 22*  --  16 16 8   CREATININE 8.75* 4.25* 5.50*  --  5.05* 5.19* 3.11*  ALBUMIN  --  2.1*  --   --  2.0* 1.9* 1.8*  CALCIUM 7.3* 7.3* 7.2*  --  7.3* 7.7* 7.5*  PHOS  --  2.0*  --  2.7 2.8 2.7 1.8*   Liver Function Tests:  Recent Labs Lab 06/18/16 0409 06/20/16 0732 06/21/16 0513  ALBUMIN 2.0* 1.9* 1.8*   No results for input(s): LIPASE, AMYLASE in the last 168 hours. No results for input(s): AMMONIA in the last 168 hours. CBC:  Recent Labs Lab 06/14/16 1637 06/15/16 0502 06/16/16 0741  06/18/16 0409 06/19/16 1045 06/20/16 0456 06/21/16 0513  WBC 21.4* 21.8* 13.1*  --  16.3* 13.2* 14.1* 10.8*  NEUTROABS 19.2* 18.6*  --   --   --   --   --   --   HGB 8.2* 8.0* 6.7*  < > 9.9* 9.6* 9.6* 9.2*  HCT 25.5* 25.0* 21.2*  < > 31.2* 30.6* 30.2* 29.0*  MCV 93.8 94.0 95.9  --  93.7 95.3 94.4 95.7  PLT 311 313 230  --  191 166 193 169  < > = values in this  interval not displayed. Cardiac Enzymes: No results for input(s): CKTOTAL, CKMB, CKMBINDEX, TROPONINI in the last 168 hours. CBG:  Recent Labs Lab 06/19/16 2107 06/20/16 1155 06/20/16 1640 06/20/16 2301 06/21/16 0749  GLUCAP 185* 102* 141* 170* 94    Iron Studies: No results for input(s): IRON, TIBC, TRANSFERRIN, FERRITIN in the last 72 hours. Studies/Results: No results found. . sodium chloride   Intravenous Once  . amLODipine  10 mg Oral Daily  . calcium carbonate  1 tablet Oral QAC supper  .  ceFAZolin (ANCEF) IV  2 g Intravenous Q M,W,F-1800  . darbepoetin (ARANESP) injection - DIALYSIS  150 mcg Intravenous Q Mon-HD  . famotidine  20 mg Oral QHS  . hydrALAZINE  25 mg Oral Q8H  . labetalol  400 mg Oral BID  BMET    Component Value Date/Time   NA 133 (L) 06/21/2016 0513   K 4.1 06/21/2016 0513   CL 100 (L) 06/21/2016 0513   CO2 25 06/21/2016 0513   GLUCOSE 95 06/21/2016 0513   BUN 8 06/21/2016 0513   CREATININE 3.11 (H) 06/21/2016 0513   CALCIUM 7.5 (L) 06/21/2016 0513   GFRNONAA 15 (L) 06/21/2016 0513   GFRAA 18 (L) 06/21/2016 0513   CBC    Component Value Date/Time   WBC 10.8 (H) 06/21/2016 0513   RBC 3.03 (L) 06/21/2016 0513   HGB 9.2 (L) 06/21/2016 0513   HCT 29.0 (L) 06/21/2016 0513   PLT 169 06/21/2016 0513   MCV 95.7 06/21/2016 0513   MCH 30.4 06/21/2016 0513   MCHC 31.7 06/21/2016 0513   RDW 15.8 (H) 06/21/2016 0513   LYMPHSABS 1.4 06/15/2016 0502   MONOABS 1.4 (H) 06/15/2016 0502   EOSABS 0.3 06/15/2016 0502   BASOSABS 0.1 06/15/2016 0502    Assessment and Plan:  1. Pyelonephritis on vanc/cefepime 2. ESRD new start. Plan for HD on MWF for now and awaiting outpatient placement in [place]. Has temporary HD cath and will need more permanent access. Was interested in PD but will need to start with incenter HD until stabilized. 1. Set up for [facility] MWF 11:00 am (will need to start 06/25/16 as she will need tunneled HD  catheter prior to discharge. 3. 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 Nutrition: renal diet 6. Hypertension:stable 7. Disposition- for hopeful discharge Monday after tunneled HD cath placement and HD session here (won't be able to get to outpatient HD center in time on Monday) 8. Vascular access- will need tunneled HD cath and AVF/AVG once ID issues addressed.  Temp HD cath placed 06/13/16. 1. pulled temp cath 06/20/16 2. Will need tunneled HD cath on Monday (either temp or tunneled depending upon WBC)  Donetta Potts, MD [facility] [phone]

## 2016-06-21 NOTE — Progress Notes (Signed)
PROGRESS NOTE    Alison Baker  ZOX:[id number] DOB: [birth year] DOA: 06/12/2016 PCP: Charletta Cousin, MD   Brief Narrative: 61 y.o.femalewith PMH of anemia renal disease, chronic renal disease, PUD, GERD, hypertension, prediabetes who is transferred from [facility] after presenting there with complaints of diarrhea, nausea and emesis for the past 3 weeks. Patient reported flu 3 weeks before this admission.  Creatinine at [facility] 14 and Ct with perinephric abscess Imaging at [facility]:CT scan of the abdomen and pelvis shows minimal bilateral hydronephrosis without obstructing stone, but with more prominent left-sided perinephric stranding and fluid. There is wall thickening of both renal pelvises. Findings are concerning for bilateral pyelonephritis and left-sided ureteritis. Prominent retroperitoneal nodes. [id number] evolving abscess in left kidney  Assessment & Plan:  # Acute kidney injury on chronic kidney disease stage 5, New ESRD -Initiated hemodialysis treatment for uremia on 2/16.  -Plan for HD catheter on Monday -outpatient hemodialysis set up in [place] -afebrile, blood cx from 2/19 NGTD  -WBC trending back down again 21->13->16.3->13->14->11 -stable now  #Pyelonephritis and left perinephric abscess:  -CT 2/15 at [place] notable for 4.3x3x2.3cm serpinginous fluid collection anterior to L renal pelvis concerning for perirenal abscess, Urine Cx result from [facility], the culture growing Escherichia coli and group B streptococcus sensitive to ceftriaxone/levaquin/cefazolin -now on IV cefazolin, clinically improving  -CT 2/18: 4.0 x 2.0 x 3.7 cm rim enhancing multiloculated fluid collection identified in the perinephric fat anterior to the left kidney -Interventional radiology was consulted on 2/19 for possible drainage of the abscesses. As per IR it is difficult location for drainage or even aspiration of fluid collection. They recommended to continue  antibiotics and repeat imaging studies in a few days.  -I called and discussed with Urology Dr.McDiarmid 2/21 who agreed with Abx Rx and recommended d/w ID regarding duration of Abx and FU with Urology in few months and imaging in 3-80months since radiographically could take a very long time to resolve -I called and discussed with ID Dr.Snider 2/21 who recommended 3 more weeks of IV Ancef with HD and could be ok to place HD catheter as long as WBC trending down -leukocytosis much improved now  #Essential hypertension: - Continue amlodipine, labetalol,  Hydralazine. -stable  #Anemia of chronic kidney disease: Received 2 units of red blood cell transfusion during hemodialysis on 2/19. ESA and IV iron during dialysis as per nephrologist. -hb stable now  #Acid reflux: Continue Pepcid  # Nausea vomiting:  -due to #1 and 2, resolved  DVT prophylaxis: Lovenox subcutaneous Code Status: Full code Family Communication:  spouse at bedside. Disposition Plan: home Monday after HD cath placement if stbale  Consultants:   Nephrology  Interventional radiology  Procedures: Renal ultrasound Antimicrobials: Vanc- 2/16 >> 2/17 - Cefepime- 2/16 >>2/19 -ancef 2/20  Subjective: Feels well, no complaints  Objective: Vitals:   06/20/16 1642 06/20/16 2305 06/21/16 0458 06/21/16 1021  BP: (!) 152/63 (!) 150/59 125/60 (!) 151/68  Pulse: 66 67 71 74  Resp: 18 16 16 18   Temp: 98.7 F (37.1 C) 99 F (37.2 C) 98.9 F (37.2 C) 98.4 F (36.9 C)  TempSrc: Oral Oral Oral Oral  SpO2: 99% 97% 98% 100%  Weight:  66.9 kg (147 lb 6.4 oz)    Height:        Intake/Output Summary (Last 24 hours) at 06/21/16 1335 Last data filed at 06/21/16 1021  Gross per 24 hour  Intake  1380 ml  Output              650 ml  Net              730 ml   Filed Weights   06/20/16 0715 06/20/16 1054 06/20/16 2305  Weight: 71.1 kg (156 lb 12 oz) 69.4 kg (153 lb) 66.9 kg (147 lb 6.4 oz)     Examination:  General exam:AAOx3, no distress, lying on bed comfortable  HEENT: TLC in neck Respiratory system: Clear bilateral. Respiratory effort normal. No wheezing or crackles Cardiovascular system: S1 & S2 heard, RRR.  No pedal edema. Gastrointestinal system: Abdomen soft, nontender, nondistended. Bowel sound positive Central nervous system: Alert and oriented. No focal neurological deficits. Extremities: Symmetric 5 x 5 power. Skin: No rashes, lesions or ulcers Psychiatry: Judgement and insight appear normal. Mood & affect appropriate.     Data Reviewed: I have personally reviewed following labs and imaging studies  CBC:  Recent Labs Lab 06/14/16 1637 06/15/16 0502 06/16/16 0741 06/16/16 0828 06/18/16 0409 06/19/16 1045 06/20/16 0456 06/21/16 0513  WBC 21.4* 21.8* 13.1*  --  16.3* 13.2* 14.1* 10.8*  NEUTROABS 19.2* 18.6*  --   --   --   --   --   --   HGB 8.2* 8.0* 6.7* 7.1* 9.9* 9.6* 9.6* 9.2*  HCT 25.5* 25.0* 21.2* 21.9* 31.2* 30.6* 30.2* 29.0*  MCV 93.8 94.0 95.9  --  93.7 95.3 94.4 95.7  PLT 311 313 230  --  191 166 193 097   Basic Metabolic Panel:  Recent Labs Lab 06/15/16 0502 06/16/16 0412 06/16/16 0741 06/18/16 0409 06/20/16 0732 06/21/16 0513  NA 135 134*  --  133* 132* 133*  K 3.4* 3.2*  --  3.3* 3.8 4.1  CL 99* 101  --  98* 97* 100*  CO2 26 25  --  24 27 25   GLUCOSE 157* 115*  --  95 103* 95  BUN 20 22*  --  16 16 8   CREATININE 4.25* 5.50*  --  5.05* 5.19* 3.11*  CALCIUM 7.3* 7.2*  --  7.3* 7.7* 7.5*  PHOS 2.0*  --  2.7 2.8 2.7 1.8*   GFR: Estimated Creatinine Clearance: 18.1 mL/min (by C-G formula based on SCr of 3.11 mg/dL (H)). Liver Function Tests:  Recent Labs Lab 06/15/16 0502 06/18/16 0409 06/20/16 0732 06/21/16 0513  ALBUMIN 2.1* 2.0* 1.9* 1.8*   No results for input(s): LIPASE, AMYLASE in the last 168 hours. No results for input(s): AMMONIA in the last 168 hours. Coagulation Profile: No results for input(s): INR,  PROTIME in the last 168 hours. Cardiac Enzymes: No results for input(s): CKTOTAL, CKMB, CKMBINDEX, TROPONINI in the last 168 hours. BNP (last 3 results) No results for input(s): PROBNP in the last 8760 hours. HbA1C: No results for input(s): HGBA1C in the last 72 hours. CBG:  Recent Labs Lab 06/20/16 1155 06/20/16 1640 06/20/16 2301 06/21/16 0749 06/21/16 1217  GLUCAP 102* 141* 170* 94 134*   Lipid Profile: No results for input(s): CHOL, HDL, LDLCALC, TRIG, CHOLHDL, LDLDIRECT in the last 72 hours. Thyroid Function Tests: No results for input(s): TSH, T4TOTAL, FREET4, T3FREE, THYROIDAB in the last 72 hours. Anemia Panel: No results for input(s): VITAMINB12, FOLATE, FERRITIN, TIBC, IRON, RETICCTPCT in the last 72 hours. Sepsis Labs: No results for input(s): PROCALCITON, LATICACIDVEN in the last 168 hours.  Recent Results (from the past 240 hour(s))  Culture, blood (routine x 2)     Status: None  Collection Time: 06/16/16 12:45 PM  Result Value Ref Range Status   Specimen Description BLOOD LEFT HAND  Final   Special Requests IN PEDIATRIC BOTTLE [place]  Final   Culture NO GROWTH 5 DAYS  Final   Report Status 06/21/2016 FINAL  Final  Culture, blood (routine x 2)     Status: None   Collection Time: 06/16/16 12:58 PM  Result Value Ref Range Status   Specimen Description BLOOD LEFT HAND  Final   Special Requests BOTTLES DRAWN AEROBIC AND ANAEROBIC 5CC  Final   Culture NO GROWTH 5 DAYS  Final   Report Status 06/21/2016 FINAL  Final  Culture, Urine     Status: None   Collection Time: 06/17/16  8:41 AM  Result Value Ref Range Status   Specimen Description URINE, RANDOM  Final   Special Requests NONE  Final   Culture NO GROWTH  Final   Report Status 06/18/2016 FINAL  Final         Radiology Studies: No results found.      Scheduled Meds: . sodium chloride   Intravenous Once  . amLODipine  10 mg Oral Daily  . calcium carbonate  1 tablet Oral QAC supper  .  ceFAZolin  (ANCEF) IV  2 g Intravenous Q M,W,F-1800  . darbepoetin (ARANESP) injection - DIALYSIS  150 mcg Intravenous Q Mon-HD  . famotidine  20 mg Oral QHS  . hydrALAZINE  25 mg Oral Q8H  . labetalol  400 mg Oral BID   Continuous Infusions:   LOS: 9 days    Domenic Polite, MD [facility] Pager [phone]  If 7PM-7AM, please contact night-coverage [email or web address] Password [email or web address] 06/21/2016, 1:35 PM

## 2016-06-22 LAB — GLUCOSE, CAPILLARY
GLUCOSE-CAPILLARY: 117 mg/dL — AB (ref 65–99)
Glucose-Capillary: 86 mg/dL (ref 65–99)
Glucose-Capillary: 142 mg/dL — ABNORMAL HIGH (ref 65–99)
Glucose-Capillary: 174 mg/dL — ABNORMAL HIGH (ref 65–99)

## 2016-06-22 LAB — CBC
HEMATOCRIT: 30.1 % — AB (ref 36.0–46.0)
Hemoglobin: 9.4 g/dL — ABNORMAL LOW (ref 12.0–15.0)
MCH: 29.7 pg (ref 26.0–34.0)
MCHC: 31.2 g/dL (ref 30.0–36.0)
MCV: 95.3 fL (ref 78.0–100.0)
PLATELETS: 196 10*3/uL (ref 150–400)
RBC: 3.16 MIL/uL — AB (ref 3.87–5.11)
RDW: 15.5 % (ref 11.5–15.5)
WBC: 10.8 10*3/uL — AB (ref 4.0–10.5)

## 2016-06-22 MED ORDER — KIDNEY FAILURE BOOK
Freq: Once | Status: AC
  Administered 2016-06-22: 09:00:00
  Filled 2016-06-22: qty 1

## 2016-06-22 NOTE — Progress Notes (Signed)
PROGRESS NOTE    Alison Baker  MLY:[id number] DOB: 1955/08/09 DOA: 06/12/2016 PCP: Charletta Cousin, MD   Brief Narrative: 61 y.o.femalewith PMH of anemia renal disease, chronic renal disease, PUD, GERD, hypertension, prediabetes who is transferred from [facility] after presenting there with complaints of diarrhea, nausea and emesis for the past 3 weeks. Patient reported flu 3 weeks before this admission.  Creatinine at [facility] 14 and Ct with perinephric abscess Imaging at [facility]:CT scan of the abdomen and pelvis shows minimal bilateral hydronephrosis without obstructing stone, but with more prominent left-sided perinephric stranding and fluid. There is wall thickening of both renal pelvises. Findings are concerning for bilateral pyelonephritis and left-sided ureteritis. Prominent retroperitoneal nodes. 4.3X3.0X2.3 evolving abscess in left kidney  Assessment & Plan:  # Acute kidney injury on chronic kidney disease stage 5, New ESRD -Initiated hemodialysis treatment for uremia on 2/16.  -Plan for HD catheter on Monday -outpatient hemodialysis set up in [place] -afebrile, blood cx from 2/19 NGTD  -WBC trending back down again 21->13->16.3->13->14->10.8->10.8 -stable now  #Pyelonephritis and left perinephric abscess:  -CT 2/15 at [place] notable for 4.3x3x2.3cm serpinginous fluid collection anterior to L renal pelvis concerning for perirenal abscess, Urine Cx result from [facility], the culture growing Escherichia coli and group B streptococcus sensitive to ceftriaxone/levaquin/cefazolin -now on IV cefazolin, clinically improving  -CT 2/18: 4.0 x 2.0 x 3.7 cm rim enhancing multiloculated fluid collection identified in the perinephric fat anterior to the left kidney -Interventional radiology was consulted on 2/19 for possible drainage of the abscesses. As per IR it is difficult location for drainage or even aspiration of fluid collection. They recommended to  continue antibiotics and repeat imaging studies in a few days.  -I called and discussed with Urology Dr.McDiarmid 2/21 who agreed with Abx Rx and recommended d/w ID regarding duration of Abx and FU with Urology in few months and imaging in 3-14months since radiographically could take a very long time to resolve -I called and discussed with ID Dr.Snider 2/21 who recommended 3 more weeks of IV Ancef with HD and could be ok to place HD catheter as long as WBC trending down -leukocytosis much improved now and stable today  #Essential hypertension: - Continue amlodipine, labetalol,  Hydralazine. -stable  #Anemia of chronic kidney disease: Received 2 units of red blood cell transfusion during hemodialysis on 2/19. ESA and IV iron during dialysis as per nephrologist. -hb stable now  #Acid reflux: Continue Pepcid  # Nausea vomiting:  -due to #1 and 2, resolved  DVT prophylaxis: Lovenox subcutaneous Code Status: Full code Family Communication:  spouse at bedside. Disposition Plan: home Monday after HD cath placement if stbale  Consultants:   Nephrology  Interventional radiology  Procedures: Renal ultrasound Antimicrobials: Vanc- 2/16 >> 2/17 - Cefepime- 2/16 >>2/19 -ancef 2/20  Subjective: Feels well, no flank pain, ambulating and eating ok  Objective: Vitals:   06/21/16 1712 06/21/16 2053 06/22/16 0541 06/22/16 0946  BP: (!) 154/67 139/63 (!) 148/66 (!) 143/59  Pulse: 65 71 67 74  Resp: 16 18 16 14   Temp: 98.4 F (36.9 C) 98.6 F (37 C) 98.7 F (37.1 C) 98.6 F (37 C)  TempSrc: Oral   Oral  SpO2: 100% 100% 100% 98%  Weight:  71.7 kg (158 lb)    Height:        Intake/Output Summary (Last 24 hours) at 06/22/16 1129 Last data filed at 06/22/16 0947  Gross per 24 hour  Intake  960 ml  Output              800 ml  Net              160 ml   Filed Weights   06/20/16 1054 06/20/16 2305 06/21/16 2053  Weight: 69.4 kg (153 lb) 66.9 kg (147 lb 6.4 oz) 71.7 kg  (158 lb)    Examination:  General exam:AAOx3, no distress, lying on bed comfortable  HEENT: TLC in neck Respiratory system: Clear bilateral. Respiratory effort normal. No wheezing or crackles Cardiovascular system: S1 & S2 heard, RRR.  No pedal edema. Gastrointestinal system: Abdomen soft, nontender, nondistended. Bowel sound positive Central nervous system: Alert and oriented. No focal neurological deficits. Extremities: Symmetric 5 x 5 power. Skin: No rashes, lesions or ulcers Psychiatry: Judgement and insight appear normal. Mood & affect appropriate.     Data Reviewed: I have personally reviewed following labs and imaging studies  CBC:  Recent Labs Lab 06/18/16 0409 06/19/16 1045 06/20/16 0456 06/21/16 0513 06/22/16 0654  WBC 16.3* 13.2* 14.1* 10.8* 10.8*  HGB 9.9* 9.6* 9.6* 9.2* 9.4*  HCT 31.2* 30.6* 30.2* 29.0* 30.1*  MCV 93.7 95.3 94.4 95.7 95.3  PLT 191 166 193 169 010   Basic Metabolic Panel:  Recent Labs Lab 06/16/16 0412 06/16/16 0741 06/18/16 0409 06/20/16 0732 06/21/16 0513  NA 134*  --  133* 132* 133*  K 3.2*  --  3.3* 3.8 4.1  CL 101  --  98* 97* 100*  CO2 25  --  24 27 25   GLUCOSE 115*  --  95 103* 95  BUN 22*  --  16 16 8   CREATININE 5.50*  --  5.05* 5.19* 3.11*  CALCIUM 7.2*  --  7.3* 7.7* 7.5*  PHOS  --  2.7 2.8 2.7 1.8*   GFR: Estimated Creatinine Clearance: 18.7 mL/min (by C-G formula based on SCr of 3.11 mg/dL (H)). Liver Function Tests:  Recent Labs Lab 06/18/16 0409 06/20/16 0732 06/21/16 0513  ALBUMIN 2.0* 1.9* 1.8*   No results for input(s): LIPASE, AMYLASE in the last 168 hours. No results for input(s): AMMONIA in the last 168 hours. Coagulation Profile: No results for input(s): INR, PROTIME in the last 168 hours. Cardiac Enzymes: No results for input(s): CKTOTAL, CKMB, CKMBINDEX, TROPONINI in the last 168 hours. BNP (last 3 results) No results for input(s): PROBNP in the last 8760 hours. HbA1C: No results for  input(s): HGBA1C in the last 72 hours. CBG:  Recent Labs Lab 06/21/16 0749 06/21/16 1217 06/21/16 1712 06/21/16 2129 06/22/16 0814  GLUCAP 94 134* 109* 166* 86   Lipid Profile: No results for input(s): CHOL, HDL, LDLCALC, TRIG, CHOLHDL, LDLDIRECT in the last 72 hours. Thyroid Function Tests: No results for input(s): TSH, T4TOTAL, FREET4, T3FREE, THYROIDAB in the last 72 hours. Anemia Panel: No results for input(s): VITAMINB12, FOLATE, FERRITIN, TIBC, IRON, RETICCTPCT in the last 72 hours. Sepsis Labs: No results for input(s): PROCALCITON, LATICACIDVEN in the last 168 hours.  Recent Results (from the past 240 hour(s))  Culture, blood (routine x 2)     Status: None   Collection Time: 06/16/16 12:45 PM  Result Value Ref Range Status   Specimen Description BLOOD LEFT HAND  Final   Special Requests IN PEDIATRIC BOTTLE [place]  Final   Culture NO GROWTH 5 DAYS  Final   Report Status 06/21/2016 FINAL  Final  Culture, blood (routine x 2)     Status: None   Collection Time: 06/16/16 12:58  PM  Result Value Ref Range Status   Specimen Description BLOOD LEFT HAND  Final   Special Requests BOTTLES DRAWN AEROBIC AND ANAEROBIC 5CC  Final   Culture NO GROWTH 5 DAYS  Final   Report Status 06/21/2016 FINAL  Final  Culture, Urine     Status: None   Collection Time: 06/17/16  8:41 AM  Result Value Ref Range Status   Specimen Description URINE, RANDOM  Final   Special Requests NONE  Final   Culture NO GROWTH  Final   Report Status 06/18/2016 FINAL  Final         Radiology Studies: No results found.      Scheduled Meds: . sodium chloride   Intravenous Once  . amLODipine  10 mg Oral Daily  . calcium carbonate  1 tablet Oral QAC supper  .  ceFAZolin (ANCEF) IV  2 g Intravenous Q M,W,F-1800  . darbepoetin (ARANESP) injection - DIALYSIS  150 mcg Intravenous Q Mon-HD  . famotidine  20 mg Oral QHS  . hydrALAZINE  25 mg Oral Q8H  . labetalol  400 mg Oral BID   Continuous  Infusions:   LOS: 10 days    Domenic Polite, MD [facility] Pager [phone]  If 7PM-7AM, please contact night-coverage [email or web address] Password [email or web address] 06/22/2016, 11:29 AM

## 2016-06-22 NOTE — Progress Notes (Signed)
Patient ID: Kateri Balch, female   DOB: [birth year], 61 y.o.   MRN: [id number] S:Feels better  O:BP (!) 148/66   Pulse 67   Temp 98.7 F (37.1 C)   Resp 16   Ht 5\' 3"  (1.6 m)   Wt 71.7 kg (158 lb)   SpO2 100%   BMI 27.99 kg/m   Intake/Output Summary (Last 24 hours) at 06/22/16 0915 Last data filed at 06/22/16 0600  Gross per 24 hour  Intake              840 ml  Output              800 ml  Net               40 ml   Intake/Output: I/O last 3 completed shifts: In: 1380 [P.O.:1380] Out: 1050 [Urine:1050]  Intake/Output this shift:  No intake/output data recorded. Weight change: 0.568 kg (1 lb 4.1 oz) Gen:NAD CVS:no rub Resp:cta NTI:RWERXV QMG:QQPYPPJ edema   Recent Labs Lab 06/16/16 0412 06/16/16 0741 06/18/16 0409 06/20/16 0732 06/21/16 0513  NA 134*  --  133* 132* 133*  K 3.2*  --  3.3* 3.8 4.1  CL 101  --  98* 97* 100*  CO2 25  --  24 27 25   GLUCOSE 115*  --  95 103* 95  BUN 22*  --  16 16 8   CREATININE 5.50*  --  5.05* 5.19* 3.11*  ALBUMIN  --   --  2.0* 1.9* 1.8*  CALCIUM 7.2*  --  7.3* 7.7* 7.5*  PHOS  --  2.7 2.8 2.7 1.8*   Liver Function Tests:  Recent Labs Lab 06/18/16 0409 06/20/16 0732 06/21/16 0513  ALBUMIN 2.0* 1.9* 1.8*   No results for input(s): LIPASE, AMYLASE in the last 168 hours. No results for input(s): AMMONIA in the last 168 hours. CBC:  Recent Labs Lab 06/18/16 0409 06/19/16 1045 06/20/16 0456 06/21/16 0513 06/22/16 0654  WBC 16.3* 13.2* 14.1* 10.8* 10.8*  HGB 9.9* 9.6* 9.6* 9.2* 9.4*  HCT 31.2* 30.6* 30.2* 29.0* 30.1*  MCV 93.7 95.3 94.4 95.7 95.3  PLT 191 166 193 169 196   Cardiac Enzymes: No results for input(s): CKTOTAL, CKMB, CKMBINDEX, TROPONINI in the last 168 hours. CBG:  Recent Labs Lab 06/21/16 0749 06/21/16 1217 06/21/16 1712 06/21/16 2129 06/22/16 0814  GLUCAP 94 134* 109* 166* 86    Iron Studies: No results for input(s): IRON, TIBC, TRANSFERRIN, FERRITIN in the last 72  hours. Studies/Results: No results found. . sodium chloride   Intravenous Once  . amLODipine  10 mg Oral Daily  . calcium carbonate  1 tablet Oral QAC supper  .  ceFAZolin (ANCEF) IV  2 g Intravenous Q M,W,F-1800  . darbepoetin (ARANESP) injection - DIALYSIS  150 mcg Intravenous Q Mon-HD  . famotidine  20 mg Oral QHS  . hydrALAZINE  25 mg Oral Q8H  . kidney failure book   Does not apply Once  . labetalol  400 mg Oral BID    BMET    Component Value Date/Time   NA 133 (L) 06/21/2016 0513   K 4.1 06/21/2016 0513   CL 100 (L) 06/21/2016 0513   CO2 25 06/21/2016 0513   GLUCOSE 95 06/21/2016 0513   BUN 8 06/21/2016 0513   CREATININE 3.11 (H) 06/21/2016 0513   CALCIUM 7.5 (L) 06/21/2016 0513   GFRNONAA 15 (L) 06/21/2016 0513   GFRAA 18 (L) 06/21/2016 0513   CBC    Component  Value Date/Time   WBC 10.8 (H) 06/22/2016 0654   RBC 3.16 (L) 06/22/2016 0654   HGB 9.4 (L) 06/22/2016 0654   HCT 30.1 (L) 06/22/2016 0654   PLT 196 06/22/2016 0654   MCV 95.3 06/22/2016 0654   MCH 29.7 06/22/2016 0654   MCHC 31.2 06/22/2016 0654   RDW 15.5 06/22/2016 0654   LYMPHSABS 1.4 06/15/2016 0502   MONOABS 1.4 (H) 06/15/2016 0502   EOSABS 0.3 06/15/2016 0502   BASOSABS 0.1 06/15/2016 0502     Assessment and Plan:  1. Pyelonephritis on vanc/cefepime 2. ESRD new start. Plan for HD on MWF for now and awaiting outpatient placement in [place]. Has temporary HD cath and will need more permanent access. Was interested in PD but will need to start with incenter HD until stabilized. 1. Set up for [facility] MWF 11:00 am (will need to start 06/25/16 as she will need tunneled HD catheter prior to discharge. 3. 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 Nutrition: renal diet 6. Hypertension:stable 7. Disposition- for hopeful discharge Monday after tunneled HD cath placement and HD session here (won't be able to get to outpatient HD center in time on Monday) 8. Vascular access-  will need tunneled HD cath and AVF/AVG once ID issues addressed. Temp HD cath placed 06/13/16. 1. pulled temp cath 06/20/16 2. Will need tunneled HD cath on Monday (either temp or tunneled depending upon WBC)  Donetta Potts, MD [facility] [phone]

## 2016-06-23 ENCOUNTER — Inpatient Hospital Stay (HOSPITAL_COMMUNITY): Payer: BLUE CROSS/BLUE SHIELD

## 2016-06-23 ENCOUNTER — Encounter (HOSPITAL_COMMUNITY): Payer: Self-pay | Admitting: *Deleted

## 2016-06-23 DIAGNOSIS — N185 Chronic kidney disease, stage 5: Secondary | ICD-10-CM

## 2016-06-23 HISTORY — PX: IR GENERIC HISTORICAL: IMG1180011

## 2016-06-23 LAB — RENAL FUNCTION PANEL
ALBUMIN: 2 g/dL — AB (ref 3.5–5.0)
Anion gap: 7 (ref 5–15)
BUN: 26 mg/dL — AB (ref 6–20)
CALCIUM: 8 mg/dL — AB (ref 8.9–10.3)
CHLORIDE: 101 mmol/L (ref 101–111)
CO2: 23 mmol/L (ref 22–32)
CREATININE: 5.52 mg/dL — AB (ref 0.44–1.00)
GFR, EST AFRICAN AMERICAN: 9 mL/min — AB (ref >60)
GFR, EST NON AFRICAN AMERICAN: 8 mL/min — AB (ref >60)
Glucose, Bld: 104 mg/dL — ABNORMAL HIGH (ref 65–99)
Phosphorus: 3.4 mg/dL (ref 2.5–4.6)
Potassium: 4.7 mmol/L (ref 3.5–5.1)
SODIUM: 131 mmol/L — AB (ref 135–145)

## 2016-06-23 LAB — GLUCOSE, CAPILLARY: Glucose-Capillary: 207 mg/dL — ABNORMAL HIGH (ref 65–99)

## 2016-06-23 LAB — CBC
HCT: 28 % — ABNORMAL LOW (ref 36.0–46.0)
Hemoglobin: 9 g/dL — ABNORMAL LOW (ref 12.0–15.0)
MCH: 30.7 pg (ref 26.0–34.0)
MCHC: 32.1 g/dL (ref 30.0–36.0)
MCV: 95.6 fL (ref 78.0–100.0)
PLATELETS: 194 10*3/uL (ref 150–400)
RBC: 2.93 MIL/uL — AB (ref 3.87–5.11)
RDW: 15.3 % (ref 11.5–15.5)
WBC: 10.2 10*3/uL (ref 4.0–10.5)

## 2016-06-23 LAB — PROTIME-INR
INR: 1.07
PROTHROMBIN TIME: 14 s (ref 11.4–15.2)

## 2016-06-23 MED ORDER — LIDOCAINE HCL (PF) 1 % IJ SOLN
INTRAMUSCULAR | Status: AC
  Filled 2016-06-23: qty 30

## 2016-06-23 MED ORDER — LABETALOL HCL 300 MG PO TABS: 300.0000 mg | ORAL_TABLET | Freq: Two times a day (BID) | ORAL | Status: DC

## 2016-06-23 MED ORDER — HYDRALAZINE HCL 25 MG PO TABS: 25.0000 mg | ORAL_TABLET | Freq: Three times a day (TID) | ORAL | 0 refills | Status: DC

## 2016-06-23 MED ORDER — FAMOTIDINE 20 MG PO TABS: 20.0000 mg | ORAL_TABLET | Freq: Every day | ORAL | 0 refills | Status: DC

## 2016-06-23 MED ORDER — MIDAZOLAM HCL 2 MG/2ML IJ SOLN
INTRAMUSCULAR | Status: AC
Start: 2016-06-23 — End: 2016-06-24
  Filled 2016-06-23: qty 2

## 2016-06-23 MED ORDER — LIDOCAINE HCL 1 % IJ SOLN
INTRAMUSCULAR | Status: AC | PRN
  Administered 2016-06-23: 15 mL

## 2016-06-23 MED ORDER — HEPARIN SODIUM (PORCINE) 1000 UNIT/ML IJ SOLN
INTRAMUSCULAR | Status: AC
Start: 2016-06-23 — End: 2016-06-24
  Filled 2016-06-23: qty 1

## 2016-06-23 MED ORDER — CEFAZOLIN SODIUM-DEXTROSE 2-4 GM/100ML-% IV SOLN: 2.0000 g | INTRAVENOUS | 0 refills | Status: DC

## 2016-06-23 MED ORDER — HEPARIN SODIUM (PORCINE) 1000 UNIT/ML DIALYSIS: 20.0000 [IU]/kg | INTRAMUSCULAR | Status: DC | PRN

## 2016-06-23 MED ORDER — MIDAZOLAM HCL 2 MG/2ML IJ SOLN
INTRAMUSCULAR | Status: AC | PRN
  Administered 2016-06-23: 1 mg via INTRAVENOUS

## 2016-06-23 MED ORDER — FENTANYL CITRATE (PF) 100 MCG/2ML IJ SOLN
INTRAMUSCULAR | Status: AC | PRN
  Administered 2016-06-23 (×2): 25 ug via INTRAVENOUS

## 2016-06-23 MED ORDER — VANCOMYCIN HCL IN DEXTROSE 1-5 GM/200ML-% IV SOLN
1000.0000 mg | Freq: Once | INTRAVENOUS | Status: DC
  Administered 2016-06-23: 1 g via INTRAVENOUS

## 2016-06-23 MED ORDER — DARBEPOETIN ALFA 150 MCG/0.3ML IJ SOSY
PREFILLED_SYRINGE | INTRAMUSCULAR | Status: AC
  Administered 2016-06-23: 150 ug via INTRAVENOUS
  Filled 2016-06-23: qty 0.3

## 2016-06-23 MED ORDER — VANCOMYCIN HCL IN DEXTROSE 1-5 GM/200ML-% IV SOLN
INTRAVENOUS | Status: AC
  Administered 2016-06-23: 1 g via INTRAVENOUS
  Filled 2016-06-23: qty 200

## 2016-06-23 MED ORDER — CEFAZOLIN SODIUM-DEXTROSE 2-4 GM/100ML-% IV SOLN
INTRAVENOUS | Status: AC
  Administered 2016-06-23: 2 g
  Filled 2016-06-23: qty 100

## 2016-06-23 MED ORDER — FENTANYL CITRATE (PF) 100 MCG/2ML IJ SOLN
INTRAMUSCULAR | Status: AC
Start: 2016-06-23 — End: 2016-06-24
  Filled 2016-06-23: qty 2

## 2016-06-23 NOTE — Progress Notes (Signed)
[facility] ROUNDING NOTE   Subjective:   Interval History:  No complaints planned for tunneled dialysis catheter   61 y.o.femalewith PMH of anemia renal disease, chronic renal disease, PUD, GERD, hypertension, prediabetes who is transferred from [facility] after presenting there with complaints of diarrhea, nausea and emesis for the past 3 weeks. Patient reported flu 3 weeks before this admission.  Creatinine at [facility] and Ct with perinephric abscess Imaging at [facility]:CT scan of the abdomen and pelvis shows minimal bilateral hydronephrosis without obstructing stone, but with more prominent left-sided perinephric stranding and fluid. There is wall thickening of both renal pelvises. Findings are concerning for bilateral pyelonephritis and left-sided ureteritis. Prominent retroperitoneal nodes. [id number] evolving abscess in left kidney  Objective:  Vital signs in last 24 hours:  Temp:  [98.6 F (37 C)-99.3 F (37.4 C)] 98.9 F (37.2 C) (02/26 0446) Pulse Rate:  [67-75] 70 (02/26 0446) Resp:  [14-18] 16 (02/26 0446) BP: (141-151)/(59-66) 151/60 (02/26 0446) SpO2:  [97 %-100 %] 97 % (02/26 0446) Weight:  [73 kg (161 lb)] 73 kg (161 lb) (02/25 2100)  Weight change: 1.361 kg (3 lb) Filed Weights   06/20/16 2305 06/21/16 2053 06/22/16 2100  Weight: 66.9 kg (147 lb 6.4 oz) 71.7 kg (158 lb) 73 kg (161 lb)    Intake/Output: I/O last 3 completed shifts: In: 1440 [P.O.:1440] Out: 975 [Urine:975]   Intake/Output this shift:  No intake/output data recorded.  CVS- RRR RS- CTA ABD- BS present soft non-distended EXT- no edema   Basic Metabolic Panel:  Recent Labs Lab 06/18/16 0409 06/20/16 0732 06/21/16 0513  NA 133* 132* 133*  K 3.3* 3.8 4.1  CL 98* 97* 100*  CO2 24 27 25   GLUCOSE 95 103* 95  BUN 16 16 8   CREATININE 5.05* 5.19* 3.11*  CALCIUM 7.3* 7.7* 7.5*  PHOS 2.8 2.7 1.8*    Liver Function Tests:  Recent Labs Lab  06/18/16 0409 06/20/16 0732 06/21/16 0513  ALBUMIN 2.0* 1.9* 1.8*   No results for input(s): LIPASE, AMYLASE in the last 168 hours. No results for input(s): AMMONIA in the last 168 hours.  CBC:  Recent Labs Lab 06/18/16 0409 06/19/16 1045 06/20/16 0456 06/21/16 0513 06/22/16 0654  WBC 16.3* 13.2* 14.1* 10.8* 10.8*  HGB 9.9* 9.6* 9.6* 9.2* 9.4*  HCT 31.2* 30.6* 30.2* 29.0* 30.1*  MCV 93.7 95.3 94.4 95.7 95.3  PLT 191 166 193 169 196    Cardiac Enzymes: No results for input(s): CKTOTAL, CKMB, CKMBINDEX, TROPONINI in the last 168 hours.  BNP: Invalid input(s): POCBNP  CBG:  Recent Labs Lab 06/21/16 2129 06/22/16 0814 06/22/16 1214 06/22/16 1642 06/22/16 2140  GLUCAP 166* 27 142* 117* 67*    Microbiology: Results for orders placed or performed during the hospital encounter of 06/12/16  Culture, blood (routine x 2)     Status: None   Collection Time: 06/16/16 12:45 PM  Result Value Ref Range Status   Specimen Description BLOOD LEFT HAND  Final   Special Requests IN PEDIATRIC BOTTLE 1CC  Final   Culture NO GROWTH 5 DAYS  Final   Report Status 06/21/2016 FINAL  Final  Culture, blood (routine x 2)     Status: None   Collection Time: 06/16/16 12:58 PM  Result Value Ref Range Status   Specimen Description BLOOD LEFT HAND  Final   Special Requests BOTTLES DRAWN AEROBIC AND ANAEROBIC 5CC  Final   Culture NO GROWTH 5 DAYS  Final   Report Status  06/21/2016 FINAL  Final  Culture, Urine     Status: None   Collection Time: 06/17/16  8:41 AM  Result Value Ref Range Status   Specimen Description URINE, RANDOM  Final   Special Requests NONE  Final   Culture NO GROWTH  Final   Report Status 06/18/2016 FINAL  Final    Coagulation Studies: No results for input(s): LABPROT, INR in the last 72 hours.  Urinalysis: No results for input(s): COLORURINE, LABSPEC, PHURINE, GLUCOSEU, HGBUR, BILIRUBINUR, KETONESUR, PROTEINUR, UROBILINOGEN, NITRITE, LEUKOCYTESUR in the last 72  hours.  Invalid input(s): APPERANCEUR    Imaging: No results found.   Medications:    . sodium chloride   Intravenous Once  . amLODipine  10 mg Oral Daily  . calcium carbonate  1 tablet Oral QAC supper  .  ceFAZolin (ANCEF) IV  2 g Intravenous Q M,Baker,F-1800  . darbepoetin (ARANESP) injection - DIALYSIS  150 mcg Intravenous Q Mon-HD  . famotidine  20 mg Oral QHS  . hydrALAZINE  25 mg Oral Q8H  . labetalol  400 mg Oral BID   acetaminophen, diphenhydrAMINE, heparin, hydrALAZINE, promethazine, sodium chloride flush  Assessment/ Plan:   ESRD new start MWF dialysis schedule at [facility]  Pyelonephritis treated with Ancef   Tunneled dialysis catheter to be placed  Would really like permanent access place - will discuss with [facility] today    LOS: 11 Alison Baker @TODAY @8 :32 AM

## 2016-06-23 NOTE — Sedation Documentation (Signed)
Patient is resting comfortably. 

## 2016-06-23 NOTE — Consult Note (Signed)
Chief Complaint: Patient was seen in consultation today for tunneled dialysis catheter placement at the request of Dr Willy Eddy  Referring Physician(s): Dr Fanny Bien Dr Willy Eddy  Supervising Physician: Arne Cleveland  Patient Status: [facility] - In-pt  History of Present Illness: Alison Baker is a 61 y.o. female   Tx from [facility] with 3 week hx N/V/D and weakness Known Pyelonephritis; L perinephric abscess Antibiotic treatment---wbc now 10.2 Known CKD Worsening renal function Initiated hemodialysis for uremia 06/13/16 Has been evaluated by Dr Justin Mend Request for tunneled dialysis catheter placement Plan for continued dialysis in [place]  Past Medical History:  Diagnosis Date  . Anemia   . Chronic kidney disease   . Gastric ulcer with hemorrhage   . GERD (gastroesophageal reflux disease)   . Hypertension   . Pre-diabetes     Past Surgical History:  Procedure Laterality Date  . ABDOMINAL HYSTERECTOMY    . CHOLECYSTECTOMY OPEN    . TOTAL ABDOMINAL HYSTERECTOMY W/ BILATERAL SALPINGOOPHORECTOMY      Allergies: Strawberry extract; Nsaids; Amoxicillin; Clonidine; and Penicillins  Medications: Prior to Admission medications   Medication Sig Start Date End Date Taking? Authorizing Provider  acetaminophen (TYLENOL) 325 MG tablet Take 650 mg by mouth every 6 (six) hours as needed.   Yes Historical Provider, MD  amLODipine (NORVASC) 10 MG tablet Take 10 mg by mouth. 09/09/15 09/08/16 Yes Historical Provider, MD  calcium carbonate (TUMS - DOSED IN MG ELEMENTAL CALCIUM) 500 MG chewable tablet Chew 1 tablet by mouth daily.   Yes Historical Provider, MD  torsemide (DEMADEX) 20 MG tablet Take 40 mg by mouth once. 09/09/15  Yes Historical Provider, MD  ceFAZolin (ANCEF) 2-4 GM/100ML-% IVPB Inject 100 mLs (2 g total) into the vein every Monday, Wednesday, and Friday at 6 PM. 06/23/16 07/09/16  Domenic Polite, MD  famotidine (PEPCID) 20 MG tablet Take 1 tablet (20 mg total) by mouth at  bedtime. 06/23/16   Domenic Polite, MD  hydrALAZINE (APRESOLINE) 25 MG tablet Take 1 tablet (25 mg total) by mouth every 8 (eight) hours. 06/23/16   Domenic Polite, MD  labetalol (NORMODYNE) 300 MG tablet Take 1 tablet (300 mg total) by mouth 2 (two) times daily. 06/23/16   Domenic Polite, MD     Family History  Problem Relation Age of Onset  . Hypertension Mother     Social History   Social History  . Marital status: Married    Spouse name: N/A  . Number of children: N/A  . Years of education: N/A   Social History Main Topics  . Smoking status: Former Smoker    Types: Cigars    Quit date: 06/27/1995  . Smokeless tobacco: Never Used  . Alcohol use No  . Drug use: No  . Sexual activity: Not Asked   Other Topics Concern  . None   Social History Narrative  . None    Review of Systems: A 12 point ROS discussed and pertinent positives are indicated in the HPI above.  All other systems are negative.  Review of Systems  Constitutional: Positive for appetite change and fatigue. Negative for fever.  Eyes: Positive for visual disturbance.       Legally blind  Respiratory: Negative for cough and shortness of breath.   Cardiovascular: Negative for chest pain.  Gastrointestinal: Positive for nausea. Negative for abdominal pain.  Neurological: Positive for weakness.  Psychiatric/Behavioral: Negative for behavioral problems and confusion.    Vital Signs: BP (!) 148/59  Pulse 66   Temp 98.3 F (36.8 C) (Oral)   Resp 18   Ht 5\' 3"  (1.6 m)   Wt 161 lb (73 kg)   SpO2 97%   BMI 28.52 kg/m   Physical Exam  Constitutional: She is oriented to person, place, and time. She appears well-nourished.  Cardiovascular: Normal rate and regular rhythm.   Pulmonary/Chest: Effort normal and breath sounds normal.  Abdominal: Soft. There is tenderness.  Musculoskeletal: Normal range of motion.  Neurological: She is alert and oriented to person, place, and time.  Skin: Skin is warm and dry.    Psychiatric: She has a normal mood and affect. Her behavior is normal. Judgment and thought content normal.  Nursing note and vitals reviewed.   Mallampati Score:  MD Evaluation Airway: WNL Heart: WNL Abdomen: WNL Chest/ Lungs: WNL ASA  Classification: 3 Mallampati/Airway Score: One  Imaging: Ct Abdomen Pelvis W Wo Contrast  Result Date: 06/15/2016 CLINICAL DATA:  Renal mass on ultrasound. EXAM: CT ABDOMEN AND PELVIS WITHOUT AND WITH CONTRAST TECHNIQUE: Multidetector CT imaging of the abdomen and pelvis was performed following the standard protocol before and following the bolus administration of intravenous contrast. CONTRAST:  151mL ISOVUE-300 IOPAMIDOL (ISOVUE-300) INJECTION 61% COMPARISON:  Must cell a Sim, ultrasound exam 06/14/2016 FINDINGS: Lower chest:  Unremarkable. Hepatobiliary: No focal abnormality within the liver parenchyma. Gallbladder surgically absent. No intrahepatic or extrahepatic biliary dilation. Pancreas: No focal mass lesion. No dilatation of the main duct. No intraparenchymal cyst. No peripancreatic edema. Spleen: No splenomegaly. No focal mass lesion. Adrenals/Urinary Tract: No adrenal nodule or mass. Wall thickening is noted in the right renal pelvis (see image 42 series 13. No discrete intraparenchymal mass lesion identified within the right kidney. Left kidney demonstrates heterogeneous enhancement mainly in the upper pole and interpolar region and there is prominent left perinephric edema/inflammation. A multiloculated 4.0 x 2.0 x 3.7 cm rim enhancing fluid collections identified anterior to the interpolar left kidney and tracks down towards the lower pole. This is associated with wall thickening in the renal pelvis and renal calices of the left kidney. Contrast excretion is delayed from both kidneys. No evidence for hydroureter. Circumferential bladder wall thickening noted. Gas in the urinary bladder may be related to recent instrumentation or infection.  Stomach/Bowel: Stomach is nondistended. No gastric wall thickening. No evidence of outlet obstruction. Duodenum is normally positioned as is the ligament of Treitz. No small bowel wall thickening. No small bowel dilatation. The terminal ileum is normal. The appendix is not visualized, but there is no edema or inflammation in the region of the cecum. Diverticular changes are noted in the left colon without evidence of diverticulitis. Vascular/Lymphatic: There is abdominal aortic atherosclerosis without aneurysm. Small lymph nodes identified gastrohepatic and hepatoduodenal ligaments. Small left para-aortic lymph nodes are identified and measure up to 9 mm short axis which is borderline increased. No pelvic sidewall lymphadenopathy. Reproductive: Uterus surgically absent.  There is no adnexal mass. Other: No intraperitoneal free fluid. Musculoskeletal: Midline fat show laxity evident. Bone windows reveal no worrisome lytic or sclerotic osseous lesions. IMPRESSION: 1. Wall thickening identified and the renal pelvis of each kidney with probable caliceal wall thickening in the left kidney. Changes are associated with circumferential bladder wall thickening and gas in the bladder lumen. Given bilateral involvement, infectious etiology favored over other possibilities such as urothelial neoplasm. 2. Heterogeneous perfusion left kidney likely related to infection/pyelonephritis. 3. 4.0 x 2.0 x 3.7 cm rim enhancing multiloculated fluid collection identified in the perinephric fat  anterior to the left kidney. Perinephric abscess could have this appearance. Given delayed contrast excretion, the left intrarenal collecting system and renal pelvis is not opacified despite repeat delayed imaging, and urinoma cannot be entirely excluded. 4. Borderline retroperitoneal lymphadenopathy in the abdomen. Electronically Signed   By: Misty Stanley M.D.   On: 06/15/2016 18:42   US Renal  Result Date: 06/14/2016 CLINICAL DATA:  Patient  with bilateral pyelonephritis. Evolving left perinephric abscess on CT. EXAM: RENAL / URINARY TRACT ULTRASOUND COMPLETE COMPARISON:  CT abdomen pelvis 06/12/2016. FINDINGS: Right Kidney: Length: 10.4 cm. Normal renal cortical thickness echogenicity. Mild hydronephrosis. Left Kidney: Length: 10.9 cm. Normal renal cortical thickness and echogenicity. Mild hydronephrosis. No definite pararenal mass identified. Bladder: Decompressed with Foley catheter. IMPRESSION: Mild bilateral hydronephrosis. No definite mass identified about the left kidney to correspond with previously described perinephric mass on prior CT. As previously recommended, when patient clinically able, recommend correlation with pre and post contrast-enhanced CT or MRI. Electronically Signed   By: Lovey Newcomer M.D.   On: 06/14/2016 14:45   Dg Chest Port 1 View  Result Date: 06/13/2016 CLINICAL DATA:  Central catheter placement EXAM: PORTABLE CHEST 1 VIEW COMPARISON:  None. FINDINGS: Central catheter tip is in the superior vena cava. No pneumothorax. There is no edema or consolidation. There is slight atelectasis in the lateral left base. Heart is upper normal in size with pulmonary vascularity within normal limits. No adenopathy. No bone lesions. IMPRESSION: Central catheter tip in superior vena cava. No pneumothorax. Slight left base atelectasis. Lungs elsewhere clear. Electronically Signed   By: Lowella Grip III M.D.   On: 06/13/2016 15:48    Labs:  CBC:  Recent Labs  06/20/16 0456 06/21/16 0513 06/22/16 0654 06/23/16 0820  WBC 14.1* 10.8* 10.8* 10.2  HGB 9.6* 9.2* 9.4* 9.0*  HCT 30.2* 29.0* 30.1* 28.0*  PLT 193 169 196 194    COAGS:  Recent Labs  06/23/16 0916  INR 1.07    BMP:  Recent Labs  06/18/16 0409 06/20/16 0732 06/21/16 0513 06/23/16 0820  NA 133* 132* 133* 131*  K 3.3* 3.8 4.1 4.7  CL 98* 97* 100* 101  CO2 24 27 25 23   GLUCOSE 95 103* 95 104*  BUN 16 16 8  26*  CALCIUM 7.3* 7.7* 7.5* 8.0*    CREATININE 5.05* 5.19* 3.11* 5.52*  GFRNONAA 9* 8* 15* 8*  GFRAA 10* 10* 18* 9*    LIVER FUNCTION TESTS:  Recent Labs  06/13/16 0622 06/13/16 1638  06/18/16 0409 06/20/16 0732 06/21/16 0513 06/23/16 0820  BILITOT 1.5*  --   --   --   --   --   --   AST 11*  --   --   --   --   --   --   ALT 11* 10*  --   --   --   --   --   ALKPHOS 97  --   --   --   --   --   --   PROT 5.8*  --   --   --   --   --   --   ALBUMIN 2.3*  --   < > 2.0* 1.9* 1.8* 2.0*  < > = values in this interval not displayed.  TUMOR MARKERS: No results for input(s): AFPTM, CEA, CA199, CHROMGRNA in the last 8760 hours.  Assessment and Plan:  Acute on chronic renal disease Initiated dialysis 2/16 Now ESRD  Plan for discharge soon ----  [place] for dialysis Scheduled for tunneled dialysis catheter placement in IR Risks and Benefits discussed with the patient including, but not limited to bleeding, infection, vascular injury, pneumothorax which may require chest tube placement, air embolism or even death All of the patient's questions were answered, patient is agreeable to proceed. Consent signed and in chart.  Thank you for this interesting consult.  I greatly enjoyed meeting Alison Baker and look forward to participating in their care.  A copy of this report was sent to the requesting provider on this date.  Electronically Signed: Monia Sabal A 06/23/2016, 10:59 AM   I spent a total of 40 Minutes    in face to face in clinical consultation, greater than 50% of which was counseling/coordinating care for tunneled dialysis catheter placement

## 2016-06-23 NOTE — Procedures (Signed)
R IJ tunneled HD cathter placement with [place] and fluoroscopy No complication No blood loss. See complete dictation in [facility].

## 2016-06-23 NOTE — Discharge Summary (Addendum)
Physician Discharge Summary  Alison Baker UDJ:[id number] DOB: 1956/03/29 DOA: 06/12/2016  PCP: Charletta Cousin, MD  Admit date: 06/12/2016 Discharge date: 06/23/2016  Time spent: 45 minutes  Recommendations for Outpatient Follow-up:  1. PCP Dr.Brad Marcello Moores in 1 week 2. Urology Dr.McDiarmid in 2-69months 3. [place] on Wednesday 11am for next Dialysis   Discharge Diagnoses:  Principal Problem:   Acute on chronic kidney failure [facility])   Perinephric abscess   Pyelonephritis   Essential hypertension   Anemia of renal disease   GERD (gastroesophageal reflux disease)   Pre-diabetes   UTI (urinary tract infection)   Nausea and vomiting  Discharge Condition: stable  Diet recommendation: DM/Renal diet  Filed Weights   06/20/16 2305 06/21/16 2053 06/22/16 2100  Weight: 66.9 kg (147 lb 6.4 oz) 71.7 kg (158 lb) 73 kg (161 lb)    History of present illness:  61 y.o.femalewith PMH of anemia renal disease, chronic renal disease, PUD, GERD, hypertension, prediabetes who is transferred from [facility] after presenting there with complaints of diarrhea, nausea and emesis for the past 3 weeks. Patient reported flu 3 weeks before this admission.  Creatinine at [place] and Ct with perinephric abscess  Hospital Course:  # Acute kidney injury on chronic kidney disease stage 5, New ESRD -Initiated hemodialysis treatment for uremia on 2/16.  -started HD using temporary catheter, this was removed 2/23 -Tunneled R IJ HD catheter placed 2/26 -outpatient hemodialysis set up in [place] -afebrile, blood cx from 2/19 NGTD  -WBC trending back down again 21->13->16.3->13->14->10.8->10.8 -next HD at [place] [facility]  #Pyelonephritis and left perinephric abscess:  -CT 2/15 at [place] notable for 4.3x3x2.3cm serpinginous fluid collection anterior to L renal pelvis concerning for perirenal abscess, Urine Cx result from [facility], the culture growing Escherichia coli  and group B streptococcus sensitive to ceftriaxone/levaquin/cefazolin -now on IV cefazolin, clinically improving  -CT 2/18: 4.0 x 2.0 x 3.7 cm rim enhancing multiloculated fluid collection identified in the perinephric fat anterior to the left kidney -Interventional radiology was consulted on 2/19 for possible drainage of the abscesses. As per IR it is difficult location for drainage or even aspiration of fluid collection. They recommended to continue antibiotics and repeat imaging studies in a few days.  -I called and discussed with Urology Dr.McDiarmid 2/21 who agreed with Abx Rx and recommended d/w ID regarding duration of Abx and FU with Urology in few months and imaging in 3-42months since radiographically could take a very long time to resolve -I called and discussed with ID Dr.Snider 2/21 who recommended 3 more weeks of IV Ancef with HD and could be ok to place HD catheter as long as WBC trending down -leukocytosis much improved now and stable today -advised FU with Dr.McDiarmid in 2-49months  #Essential hypertension: - Continue amlodipine, labetalol,  Hydralazine. -stable  #Anemia of chronic kidney disease: Received 2 units of red blood cell transfusion during hemodialysis on 2/19. ESA and IV iron during dialysis as per nephrologist. -hb stable now  #Acid reflux: Continue Pepcid  # Nausea vomiting:  -due to #1 and 2, resolved  Consultations:  Renal  Urology   Discharge Exam: Vitals:   06/23/16 1432 06/23/16 1442  BP: (!) 150/76 (!) 141/85  Pulse: 70 66  Resp: 18 16  Temp:      General: AAOx3 Cardiovascular: S1S2/RRR Respiratory: CTAB  Discharge Instructions    Current Discharge Medication List    START taking these medications   Details  ceFAZolin (ANCEF) 2-4 GM/100ML-% IVPB  Inject 100 mLs (2 g total) into the vein every Monday, Wednesday, and Friday at 6 PM. Qty: 1 each, Refills: 0    famotidine (PEPCID) 20 MG tablet Take 1 tablet (20 mg total) by mouth  at bedtime. Qty: 30 tablet, Refills: 0    hydrALAZINE (APRESOLINE) 25 MG tablet Take 1 tablet (25 mg total) by mouth every 8 (eight) hours. Qty: 90 tablet, Refills: 0      CONTINUE these medications which have CHANGED   Details  labetalol (NORMODYNE) 300 MG tablet Take 1 tablet (300 mg total) by mouth 2 (two) times daily.      CONTINUE these medications which have NOT CHANGED   Details  acetaminophen (TYLENOL) 325 MG tablet Take 650 mg by mouth every 6 (six) hours as needed.    amLODipine (NORVASC) 10 MG tablet Take 10 mg by mouth.    calcium carbonate (TUMS - DOSED IN MG ELEMENTAL CALCIUM) 500 MG chewable tablet Chew 1 tablet by mouth daily.      STOP taking these medications     torsemide (DEMADEX) 20 MG tablet        Allergies  Allergen Reactions  . Strawberry Extract Swelling  . Nsaids Other (See Comments)    Has a healing stomach ulcer  . Amoxicillin Rash  . Clonidine Nausea And Vomiting  . Penicillins Rash   Follow-up Information    Charletta Cousin, MD. Schedule an appointment as soon as possible for a visit in 1 week(s).   Specialty:  Family Medicine Contact information: [place] [phone] [address] [facility] up.   Why:  On Wednesday at 11am for Dialysis           The results of significant diagnostics from this hospitalization (including imaging, microbiology, ancillary and laboratory) are listed below for reference.    Significant Diagnostic Studies: Ct Abdomen Pelvis W Wo Contrast  Result Date: 06/15/2016 CLINICAL DATA:  Renal mass on ultrasound. EXAM: CT ABDOMEN AND PELVIS WITHOUT AND WITH CONTRAST TECHNIQUE: Multidetector CT imaging of the abdomen and pelvis was performed following the standard protocol before and following the bolus administration of intravenous contrast. CONTRAST:  127mL ISOVUE-300 IOPAMIDOL (ISOVUE-300) INJECTION 61% COMPARISON:  Must cell a Sim, ultrasound exam 06/14/2016  FINDINGS: Lower chest:  Unremarkable. Hepatobiliary: No focal abnormality within the liver parenchyma. Gallbladder surgically absent. No intrahepatic or extrahepatic biliary dilation. Pancreas: No focal mass lesion. No dilatation of the main duct. No intraparenchymal cyst. No peripancreatic edema. Spleen: No splenomegaly. No focal mass lesion. Adrenals/Urinary Tract: No adrenal nodule or mass. Wall thickening is noted in the right renal pelvis (see image 42 series 13. No discrete intraparenchymal mass lesion identified within the right kidney. Left kidney demonstrates heterogeneous enhancement mainly in the upper pole and interpolar region and there is prominent left perinephric edema/inflammation. A multiloculated 4.0 x 2.0 x 3.7 cm rim enhancing fluid collections identified anterior to the interpolar left kidney and tracks down towards the lower pole. This is associated with wall thickening in the renal pelvis and renal calices of the left kidney. Contrast excretion is delayed from both kidneys. No evidence for hydroureter. Circumferential bladder wall thickening noted. Gas in the urinary bladder may be related to recent instrumentation or infection. Stomach/Bowel: Stomach is nondistended. No gastric wall thickening. No evidence of outlet obstruction. Duodenum is normally positioned as is the ligament of Treitz. No small bowel wall thickening. No small bowel dilatation. The  terminal ileum is normal. The appendix is not visualized, but there is no edema or inflammation in the region of the cecum. Diverticular changes are noted in the left colon without evidence of diverticulitis. Vascular/Lymphatic: There is abdominal aortic atherosclerosis without aneurysm. Small lymph nodes identified gastrohepatic and hepatoduodenal ligaments. Small left para-aortic lymph nodes are identified and measure up to 9 mm short axis which is borderline increased. No pelvic sidewall lymphadenopathy. Reproductive: Uterus surgically  absent.  There is no adnexal mass. Other: No intraperitoneal free fluid. Musculoskeletal: Midline fat show laxity evident. Bone windows reveal no worrisome lytic or sclerotic osseous lesions. IMPRESSION: 1. Wall thickening identified and the renal pelvis of each kidney with probable caliceal wall thickening in the left kidney. Changes are associated with circumferential bladder wall thickening and gas in the bladder lumen. Given bilateral involvement, infectious etiology favored over other possibilities such as urothelial neoplasm. 2. Heterogeneous perfusion left kidney likely related to infection/pyelonephritis. 3. 4.0 x 2.0 x 3.7 cm rim enhancing multiloculated fluid collection identified in the perinephric fat anterior to the left kidney. Perinephric abscess could have this appearance. Given delayed contrast excretion, the left intrarenal collecting system and renal pelvis is not opacified despite repeat delayed imaging, and urinoma cannot be entirely excluded. 4. Borderline retroperitoneal lymphadenopathy in the abdomen. Electronically Signed   By: Misty Stanley M.D.   On: 06/15/2016 18:42   US Renal  Result Date: 06/14/2016 CLINICAL DATA:  Patient with bilateral pyelonephritis. Evolving left perinephric abscess on CT. EXAM: RENAL / URINARY TRACT ULTRASOUND COMPLETE COMPARISON:  CT abdomen pelvis 06/12/2016. FINDINGS: Right Kidney: Length: 10.4 cm. Normal renal cortical thickness echogenicity. Mild hydronephrosis. Left Kidney: Length: 10.9 cm. Normal renal cortical thickness and echogenicity. Mild hydronephrosis. No definite pararenal mass identified. Bladder: Decompressed with Foley catheter. IMPRESSION: Mild bilateral hydronephrosis. No definite mass identified about the left kidney to correspond with previously described perinephric mass on prior CT. As previously recommended, when patient clinically able, recommend correlation with pre and post contrast-enhanced CT or MRI. Electronically Signed   By:  Lovey Newcomer M.D.   On: 06/14/2016 14:45   Dg Chest Port 1 View  Result Date: 06/13/2016 CLINICAL DATA:  Central catheter placement EXAM: PORTABLE CHEST 1 VIEW COMPARISON:  None. FINDINGS: Central catheter tip is in the superior vena cava. No pneumothorax. There is no edema or consolidation. There is slight atelectasis in the lateral left base. Heart is upper normal in size with pulmonary vascularity within normal limits. No adenopathy. No bone lesions. IMPRESSION: Central catheter tip in superior vena cava. No pneumothorax. Slight left base atelectasis. Lungs elsewhere clear. Electronically Signed   By: Lowella Grip III M.D.   On: 06/13/2016 15:48    Microbiology: Recent Results (from the past 240 hour(s))  Culture, blood (routine x 2)     Status: None   Collection Time: 06/16/16 12:45 PM  Result Value Ref Range Status   Specimen Description BLOOD LEFT HAND  Final   Special Requests IN PEDIATRIC BOTTLE [place]  Final   Culture NO GROWTH 5 DAYS  Final   Report Status 06/21/2016 FINAL  Final  Culture, blood (routine x 2)     Status: None   Collection Time: 06/16/16 12:58 PM  Result Value Ref Range Status   Specimen Description BLOOD LEFT HAND  Final   Special Requests BOTTLES DRAWN AEROBIC AND ANAEROBIC 5CC  Final   Culture NO GROWTH 5 DAYS  Final   Report Status 06/21/2016 FINAL  Final  Culture, Urine  Status: None   Collection Time: 06/17/16  8:41 AM  Result Value Ref Range Status   Specimen Description URINE, RANDOM  Final   Special Requests NONE  Final   Culture NO GROWTH  Final   Report Status 06/18/2016 FINAL  Final     Labs: Basic Metabolic Panel:  Recent Labs Lab 06/18/16 0409 06/20/16 0732 06/21/16 0513 06/23/16 0820  NA 133* 132* 133* 131*  K 3.3* 3.8 4.1 4.7  CL 98* 97* 100* 101  CO2 24 27 25 23   GLUCOSE 95 103* 95 104*  BUN 16 16 8  26*  CREATININE 5.05* 5.19* 3.11* 5.52*  CALCIUM 7.3* 7.7* 7.5* 8.0*  PHOS 2.8 2.7 1.8* 3.4   Liver Function  Tests:  Recent Labs Lab 06/18/16 0409 06/20/16 0732 06/21/16 0513 06/23/16 0820  ALBUMIN 2.0* 1.9* 1.8* 2.0*   No results for input(s): LIPASE, AMYLASE in the last 168 hours. No results for input(s): AMMONIA in the last 168 hours. CBC:  Recent Labs Lab 06/19/16 1045 06/20/16 0456 06/21/16 0513 06/22/16 0654 06/23/16 0820  WBC 13.2* 14.1* 10.8* 10.8* 10.2  HGB 9.6* 9.6* 9.2* 9.4* 9.0*  HCT 30.6* 30.2* 29.0* 30.1* 28.0*  MCV 95.3 94.4 95.7 95.3 95.6  PLT 166 193 169 196 194   Cardiac Enzymes: No results for input(s): CKTOTAL, CKMB, CKMBINDEX, TROPONINI in the last 168 hours. BNP: BNP (last 3 results) No results for input(s): BNP in the last 8760 hours.  ProBNP (last 3 results) No results for input(s): PROBNP in the last 8760 hours.  CBG:  Recent Labs Lab 06/21/16 2129 06/22/16 0814 06/22/16 1214 06/22/16 1642 06/22/16 2140  GLUCAP 166* 86 142* 117* 174*       SignedDomenic Polite MD.  [facility] 06/23/2016, 2:50 PM

## 2016-06-23 NOTE — Consult Note (Signed)
[facility] [place]  Patient name: Alison Baker MRN: [id number] DOB: 04/21/1956 Sex: female  REASON FOR CONSULT: permanent dialysis access, consult is from Dr. Justin Mend  HPI: Alison Baker is a 61 y.o. female with new ESRD who is in need of permanent dialysis access. She was recently seen by Dr. Donnetta Hutching on 05/13/16 for access placement. She is right handed. At that time, she was not yet on hemodialysis. She [facility] on 06/13/16 with 3 week history of diarrhea, nausea and vomiting. She also had the flu three weeks prior admission.    Past Medical History:  Diagnosis Date  . Anemia   . Chronic kidney disease   . Gastric ulcer with hemorrhage   . GERD (gastroesophageal reflux disease)   . Hypertension   . Pre-diabetes     Family History  Problem Relation Age of Onset  . Hypertension Mother     SOCIAL HISTORY: Social History   Social History  . Marital status: Married    Spouse name: N/A  . Number of children: N/A  . Years of education: N/A   Occupational History  . Not on file.   Social History Main Topics  . Smoking status: Former Smoker    Types: Cigars    Quit date: 06/27/1995  . Smokeless tobacco: Never Used  . Alcohol use No  . Drug use: No  . Sexual activity: Not on file   Other Topics Concern  . Not on file   Social History Narrative  . No narrative on file    Allergies  Allergen Reactions  . Strawberry Extract Swelling  . Nsaids Other (See Comments)    Has a healing stomach ulcer  . Amoxicillin Rash  . Clonidine Nausea And Vomiting  . Penicillins Rash    Current Facility-Administered Medications  Medication Dose Route Frequency Provider Last Rate Last Dose  . 0.9 %  sodium chloride infusion   Intravenous Once Dron Tanna Furry, MD      . acetaminophen (TYLENOL) tablet 650 mg  650 mg Oral Q6H PRN Reubin Milan, MD      . amLODipine (NORVASC) tablet 10 mg  10 mg Oral Daily Reubin Milan, MD   10  mg at 06/23/16 1104  . calcium carbonate (TUMS - dosed in mg elemental calcium) chewable tablet 200 mg of elemental calcium  1 tablet Oral QAC supper Reubin Milan, MD   200 mg of elemental calcium at 06/22/16 1734  . ceFAZolin (ANCEF) IVPB 2g/100 mL premix  2 g Intravenous Q M,W,F-1800 Kimberly B Hammons, RPH   2 g at 06/20/16 1034  . Darbepoetin Alfa (ARANESP) injection 150 mcg  150 mcg Intravenous Q Mon-HD Rexene Agent, MD   150 mcg at 06/16/16 0941  . diphenhydrAMINE (BENADRYL) 12.5 MG/5ML elixir 12.5 mg  12.5 mg Oral Q6H PRN Dron Tanna Furry, MD      . famotidine (PEPCID) tablet 20 mg  20 mg Oral QHS Dron Tanna Furry, MD   20 mg at 06/22/16 2148  . heparin injection 1,400 Units  20 Units/kg Dialysis PRN Donato Heinz, MD      . hydrALAZINE (APRESOLINE) injection 10 mg  10 mg Intravenous Q4H PRN Ejiroghene E Emokpae, MD      . hydrALAZINE (APRESOLINE) tablet 25 mg  25 mg Oral Q8H Dron Tanna Furry, MD   25 mg at 06/23/16 0516  . labetalol (NORMODYNE) tablet 400 mg  400 mg Oral BID Ejiroghene E  Emokpae, MD   400 mg at 06/23/16 1104  . promethazine (PHENERGAN) injection 12.5 mg  12.5 mg Intravenous Q6H PRN Oswald Hillock, MD   12.5 mg at 06/18/16 0326  . sodium chloride flush (NS) 0.9 % injection 10-40 mL  10-40 mL Intracatheter PRN Rexene Agent, MD   20 mL at 06/19/16 1046    REVIEW OF SYSTEMS:  [X]  denotes positive finding, [ ]  denotes negative finding Cardiac  Comments:  Chest pain or chest pressure:    Shortness of breath upon exertion:    Short of breath when lying flat:    Irregular heart rhythm:        Vascular    Pain in calf, thigh, or hip brought on by ambulation:    Pain in feet at night that wakes you up from your sleep:     Blood clot in your veins:    Leg swelling:         Pulmonary    Oxygen at home:    Productive cough:     Wheezing:         Neurologic    Sudden weakness in arms or legs:     Sudden numbness in arms or legs:     Sudden onset  of difficulty speaking or slurred speech:    Temporary loss of vision in one eye:     Problems with dizziness:         Gastrointestinal    Blood in stool:     Vomited blood:         Genitourinary    Burning when urinating:     Blood in urine:        Psychiatric    Major depression:         Hematologic    Bleeding problems:    Problems with blood clotting too easily:        Skin    Rashes or ulcers:        Constitutional    Fever or chills:      PHYSICAL EXAM: Vitals:   06/22/16 2019 06/22/16 2100 06/23/16 0446 06/23/16 1000  BP: (!) 142/63  (!) 151/60 (!) 148/59  Pulse: 67  70 66  Resp: 17  16 18   Temp: 98.6 F (37 C)  98.9 F (37.2 C) 98.3 F (36.8 C)  TempSrc:    Oral  SpO2: 99%  97% 97%  Weight:  161 lb (73 kg)    Height:        GENERAL: The patient is a well-nourished female, in no acute distress. The vital signs are documented above. CARDIAC: There is a regular rate and rhythm. No carotid bruits.  VASCULAR: 2+ radial and brachial pulses bilaterally.  PULMONARY: There is good air exchange bilaterally without wheezing or rales. MUSCULOSKELETAL: There are no major deformities or cyanosis. NEUROLOGIC: No focal weakness or paresthesias are detected. SKIN: There are no ulcers or rashes noted. PSYCHIATRIC: The patient has a normal affect.  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   MEDICAL ISSUES: New ESRD  For [facility] placement by IR today. Will likely need graft placement given small vein conduits, but will make determination intra-op. Patient currently undergoing treatment for pyelonephritis. Would like to wait until her infection is clear prior to graft placement but will discuss with Dr. Donnetta Hutching.    Virgina Jock, PA-C [facility] [place]  I have examined the patient, reviewed and agree with above.Patient known  to me from prior office visit. Had a stable chronic renal insufficiency at that  time. Now had acute illness and is on hemodialysis. Also with perinephric abscess on the long-term antibiotic treatment. Discussed with patient need for left arm graft. Would defer this for 2-3 weeks to assure that she continues to remain afebrile. Will ask our office to schedule this on a nondialysis day and 2-3 week time frame  Curt Jews, MD 06/23/2016 4:40 PM

## 2016-06-24 ENCOUNTER — Other Ambulatory Visit: Payer: Self-pay | Admitting: *Deleted

## 2016-06-24 ENCOUNTER — Encounter: Payer: Self-pay | Admitting: *Deleted

## 2016-06-24 LAB — CBC
HEMATOCRIT: 28.2 % — AB (ref 36.0–46.0)
Hemoglobin: 8.9 g/dL — ABNORMAL LOW (ref 12.0–15.0)
MCH: 30.2 pg (ref 26.0–34.0)
MCHC: 31.6 g/dL (ref 30.0–36.0)
MCV: 95.6 fL (ref 78.0–100.0)
PLATELETS: 201 10*3/uL (ref 150–400)
RBC: 2.95 MIL/uL — ABNORMAL LOW (ref 3.87–5.11)
RDW: 15.3 % (ref 11.5–15.5)
WBC: 8.8 10*3/uL (ref 4.0–10.5)

## 2016-06-24 LAB — GLUCOSE, CAPILLARY: Glucose-Capillary: 145 mg/dL — ABNORMAL HIGH (ref 65–99)

## 2016-06-24 NOTE — Progress Notes (Signed)
[facility] ROUNDING NOTE   Subjective:   Interval History:Interval History:  No complaints planned for tunneled dialysis catheter   61 y.o.femalewith PMH of anemia renal disease, chronic renal disease, PUD, GERD, hypertension, prediabetes who is transferred from [facility] after presenting there with complaints of diarrhea, nausea and emesis for the past 3 weeks. Patient reported flu 3 weeks before this admission.  Creatinine at [facility] [address] and Ct with perinephric abscess Imaging at [facility]:CT scan of the abdomen and pelvis shows minimal bilateral hydronephrosis without obstructing stone, but with more prominent left-sided perinephric stranding and fluid. There is wall thickening of both renal pelvises. Findings are concerning for bilateral pyelonephritis and left-sided ureteritis. Prominent retroperitoneal nodes. [id number] evolving abscess in left kidney  Objective:  Vital signs in last 24 hours:  Temp:  [98.3 F (36.8 C)-98.9 F (37.2 C)] 98.5 F (36.9 C) (02/27 0535) Pulse Rate:  [62-80] 69 (02/27 0535) Resp:  [16-18] 18 (02/27 0535) BP: (119-150)/(59-102) 134/61 (02/27 0535) SpO2:  [97 %-100 %] 98 % (02/27 0535) Weight:  [71.7 kg (158 lb 1.1 oz)-74 kg (163 lb 2.3 oz)] 72.9 kg (160 lb 11.5 oz) (02/26 2336)  Weight change: 0.971 kg (2 lb 2.3 oz) Filed Weights   06/23/16 1545 06/23/16 1930 06/23/16 2336  Weight: 74 kg (163 lb 2.3 oz) 71.7 kg (158 lb 1.1 oz) 72.9 kg (160 lb 11.5 oz)    Intake/Output: I/O last 3 completed shifts: In: 69 [P.O.:60] Out: 2375 [Urine:575; Other:1800]   Intake/Output this shift:  Total I/O In: 120 [P.O.:120] Out: 0   CVS- RRR RS- CTA ABD- BS present soft non-distended EXT- no edema   Basic Metabolic Panel:  Recent Labs Lab 06/18/16 0409 06/20/16 0732 06/21/16 0513 06/23/16 0820  NA 133* 132* 133* 131*  K 3.3* 3.8 4.1 4.7  CL 98* 97* 100* 101  CO2 24 27 25 23   GLUCOSE 95 103* 95 104*  BUN 16 16 8   26*  CREATININE 5.05* 5.19* 3.11* 5.52*  CALCIUM 7.3* 7.7* 7.5* 8.0*  PHOS 2.8 2.7 1.8* 3.4    Liver Function Tests:  Recent Labs Lab 06/18/16 0409 06/20/16 0732 06/21/16 0513 06/23/16 0820  ALBUMIN 2.0* 1.9* 1.8* 2.0*   No results for input(s): LIPASE, AMYLASE in the last 168 hours. No results for input(s): AMMONIA in the last 168 hours.  CBC:  Recent Labs Lab 06/20/16 0456 06/21/16 0513 06/22/16 0654 06/23/16 0820 06/24/16 0546  WBC 14.1* 10.8* 10.8* 10.2 8.8  HGB 9.6* 9.2* 9.4* 9.0* 8.9*  HCT 30.2* 29.0* 30.1* 28.0* 28.2*  MCV 94.4 95.7 95.3 95.6 95.6  PLT 193 169 196 194 201    Cardiac Enzymes: No results for input(s): CKTOTAL, CKMB, CKMBINDEX, TROPONINI in the last 168 hours.  BNP: Invalid input(s): POCBNP  CBG:  Recent Labs Lab 06/22/16 1214 06/22/16 1642 06/22/16 2140 06/23/16 2233 06/24/16 0801  GLUCAP 142* 117* 174* 207* 145*    Microbiology: Results for orders placed or performed during the hospital encounter of 06/12/16  Culture, blood (routine x 2)     Status: None   Collection Time: 06/16/16 12:45 PM  Result Value Ref Range Status   Specimen Description BLOOD LEFT HAND  Final   Special Requests IN PEDIATRIC BOTTLE [place]  Final   Culture NO GROWTH 5 DAYS  Final   Report Status 06/21/2016 FINAL  Final  Culture, blood (routine x 2)     Status: None   Collection Time: 06/16/16 12:58 PM  Result Value Ref Range  Status   Specimen Description BLOOD LEFT HAND  Final   Special Requests BOTTLES DRAWN AEROBIC AND ANAEROBIC 5CC  Final   Culture NO GROWTH 5 DAYS  Final   Report Status 06/21/2016 FINAL  Final  Culture, Urine     Status: None   Collection Time: 06/17/16  8:41 AM  Result Value Ref Range Status   Specimen Description URINE, RANDOM  Final   Special Requests NONE  Final   Culture NO GROWTH  Final   Report Status 06/18/2016 FINAL  Final    Coagulation Studies:  Recent Labs  06/23/16 0916  LABPROT 14.0  INR 1.07     Urinalysis: No results for input(s): COLORURINE, LABSPEC, PHURINE, GLUCOSEU, HGBUR, BILIRUBINUR, KETONESUR, PROTEINUR, UROBILINOGEN, NITRITE, LEUKOCYTESUR in the last 72 hours.  Invalid input(s): APPERANCEUR    Imaging: Ir Fluoro Guide Cv Line Right  Result Date: 06/23/2016 CLINICAL DATA:  Renal insufficiency, needs durable venous access for hemodialysis EXAM: TUNNELED HEMODIALYSIS CATHETER PLACEMENT WITH ULTRASOUND AND FLUOROSCOPIC GUIDANCE TECHNIQUE: The procedure, risks, benefits, and alternatives were explained to the patient. Questions regarding the procedure were encouraged and answered. The patient understands and consents to the procedure. As antibiotic prophylaxis, cefazolin 2 g was ordered pre-procedure and administered intravenously within one hour of incision.Patency of the right IJ vein was confirmed with ultrasound with image documentation. An appropriate skin site was determined. Region was prepped using maximum barrier technique including cap and mask, sterile gown, sterile gloves, large sterile sheet, and Chlorhexidine as cutaneous antisepsis. The region was infiltrated locally with 1% lidocaine. Intravenous Fentanyl and Versed were administered as conscious sedation during continuous monitoring of the patient's level of consciousness and physiological / cardiorespiratory status by the radiology RN, with a total moderate sedation time of 10 minutes. Under real-time ultrasound guidance, the right IJ vein was accessed with a 21 gauge micropuncture needle; the needle tip within the vein was confirmed with ultrasound image documentation. Needle exchanged over the 018 guidewire for transitional dilator, which allowed advancement of a Benson wire into the IVC. Over this, an MPA catheter was advanced. A Palindrome 19 hemodialysis catheter was tunneled from the right anterior chest wall approach to the right IJ dermatotomy site. The MPA catheter was exchanged over an Amplatz wire for serial  vascular dilators which allow placement of a peel-away sheath, through which the catheter was advanced under intermittent fluoroscopy, positioned with its tips in the proximal and midright atrium. Spot chest radiograph confirms good catheter position. No pneumothorax. Catheter was flushed and primed per protocol. Catheter secured externally with O Prolene sutures. The right IJ dermatotomy site was closed with Dermabond. COMPLICATIONS: COMPLICATIONS None immediate FLUOROSCOPY TIME:  12 seconds (1 mGy) COMPARISON:  None IMPRESSION: 1. Technically successful placement of tunneled right IJ hemodialysis catheter with ultrasound and fluoroscopic guidance. Ready for routine use. ACCESS: Remains approachable for percutaneous intervention as needed. Electronically Signed   By: Alison [facility] M.D.   On: 06/23/2016 16:24   Ir US Guide Vasc Access Right  Result Date: 06/23/2016 CLINICAL DATA:  Renal insufficiency, needs durable venous access for hemodialysis EXAM: TUNNELED HEMODIALYSIS CATHETER PLACEMENT WITH ULTRASOUND AND FLUOROSCOPIC GUIDANCE TECHNIQUE: The procedure, risks, benefits, and alternatives were explained to the patient. Questions regarding the procedure were encouraged and answered. The patient understands and consents to the procedure. As antibiotic prophylaxis, cefazolin 2 g was ordered pre-procedure and administered intravenously within one hour of incision.Patency of the right IJ vein was confirmed with ultrasound with image documentation. An appropriate skin site was  determined. Region was prepped using maximum barrier technique including cap and mask, sterile gown, sterile gloves, large sterile sheet, and Chlorhexidine as cutaneous antisepsis. The region was infiltrated locally with 1% lidocaine. Intravenous Fentanyl and Versed were administered as conscious sedation during continuous monitoring of the patient's level of consciousness and physiological / cardiorespiratory status by the radiology RN, with a  total moderate sedation time of 10 minutes. Under real-time ultrasound guidance, the right IJ vein was accessed with a 21 gauge micropuncture needle; the needle tip within the vein was confirmed with ultrasound image documentation. Needle exchanged over the 018 guidewire for transitional dilator, which allowed advancement of a Benson wire into the IVC. Over this, an MPA catheter was advanced. A Palindrome 19 hemodialysis catheter was tunneled from the right anterior chest wall approach to the right IJ dermatotomy site. The MPA catheter was exchanged over an Amplatz wire for serial vascular dilators which allow placement of a peel-away sheath, through which the catheter was advanced under intermittent fluoroscopy, positioned with its tips in the proximal and midright atrium. Spot chest radiograph confirms good catheter position. No pneumothorax. Catheter was flushed and primed per protocol. Catheter secured externally with O Prolene sutures. The right IJ dermatotomy site was closed with Dermabond. COMPLICATIONS: COMPLICATIONS None immediate FLUOROSCOPY TIME:  12 seconds (1 mGy) COMPARISON:  None IMPRESSION: 1. Technically successful placement of tunneled right IJ hemodialysis catheter with ultrasound and fluoroscopic guidance. Ready for routine use. ACCESS: Remains approachable for percutaneous intervention as needed. Electronically Signed   By: Alison Baker M.D.   On: 06/23/2016 16:24     Medications:    . sodium chloride   Intravenous Once  . amLODipine  10 mg Oral Daily  . calcium carbonate  1 tablet Oral QAC supper  .  ceFAZolin (ANCEF) IV  2 g Intravenous Q M,Baker,F-1800  . darbepoetin (ARANESP) injection - DIALYSIS  150 mcg Intravenous Q Mon-HD  . famotidine  20 mg Oral QHS  . hydrALAZINE  25 mg Oral Q8H  . labetalol  400 mg Oral BID   acetaminophen, diphenhydrAMINE, hydrALAZINE, promethazine, sodium chloride flush  Assessment/ Plan:   ESRD new start MWF dialysis schedule at [facility]  Pyelonephritis treated with Ancef / perinephric abscess   Tunneled dialysis catheter to be placed   Appreciate Dr Donnetta Hutching -- will follow up as outpatient    LOS: 12 [facility] @TODAY @8 :55 AM

## 2016-06-24 NOTE — Discharge Planning (Signed)
Patient discharged home in stable condition. Verbalizes understanding of all discharge instructions, including home medications and follow up appointments. 

## 2016-06-25 DIAGNOSIS — N151 Renal and perinephric abscess: Secondary | ICD-10-CM | POA: Diagnosis not present

## 2016-06-25 DIAGNOSIS — R197 Diarrhea, unspecified: Secondary | ICD-10-CM | POA: Diagnosis not present

## 2016-06-25 DIAGNOSIS — N186 End stage renal disease: Secondary | ICD-10-CM | POA: Diagnosis not present

## 2016-06-26 DIAGNOSIS — H4311 Vitreous hemorrhage, right eye: Secondary | ICD-10-CM | POA: Diagnosis not present

## 2016-06-26 DIAGNOSIS — E113591 Type 2 diabetes mellitus with proliferative diabetic retinopathy without macular edema, right eye: Secondary | ICD-10-CM | POA: Diagnosis not present

## 2016-06-27 DIAGNOSIS — N186 End stage renal disease: Secondary | ICD-10-CM | POA: Diagnosis not present

## 2016-06-27 DIAGNOSIS — N151 Renal and perinephric abscess: Secondary | ICD-10-CM | POA: Diagnosis not present

## 2016-06-30 DIAGNOSIS — Z23 Encounter for immunization: Secondary | ICD-10-CM | POA: Diagnosis not present

## 2016-06-30 DIAGNOSIS — N151 Renal and perinephric abscess: Secondary | ICD-10-CM | POA: Diagnosis not present

## 2016-06-30 DIAGNOSIS — N186 End stage renal disease: Secondary | ICD-10-CM | POA: Diagnosis not present

## 2016-07-02 ENCOUNTER — Observation Stay (HOSPITAL_COMMUNITY)
Admission: EM | Admit: 2016-07-02 | Discharge: 2016-07-03 | Disposition: A | Discharge status: Home / Self Care | Payer: BLUE CROSS/BLUE SHIELD | Attending: Internal Medicine | Admitting: Internal Medicine

## 2016-07-02 ENCOUNTER — Encounter (HOSPITAL_COMMUNITY): Payer: Self-pay

## 2016-07-02 DIAGNOSIS — N136 Pyonephrosis: Secondary | ICD-10-CM | POA: Diagnosis not present

## 2016-07-02 DIAGNOSIS — R7303 Prediabetes: Secondary | ICD-10-CM | POA: Diagnosis not present

## 2016-07-02 DIAGNOSIS — N179 Acute kidney failure, unspecified: Secondary | ICD-10-CM | POA: Diagnosis not present

## 2016-07-02 DIAGNOSIS — Z23 Encounter for immunization: Secondary | ICD-10-CM | POA: Diagnosis not present

## 2016-07-02 DIAGNOSIS — Z90710 Acquired absence of both cervix and uterus: Secondary | ICD-10-CM | POA: Diagnosis not present

## 2016-07-02 DIAGNOSIS — Z87891 Personal history of nicotine dependence: Secondary | ICD-10-CM | POA: Insufficient documentation

## 2016-07-02 DIAGNOSIS — I132 Hypertensive heart and chronic kidney disease with heart failure and with stage 5 chronic kidney disease, or end stage renal disease: Secondary | ICD-10-CM | POA: Diagnosis not present

## 2016-07-02 DIAGNOSIS — Z880 Allergy status to penicillin: Secondary | ICD-10-CM | POA: Diagnosis not present

## 2016-07-02 DIAGNOSIS — R55 Syncope and collapse: Principal | ICD-10-CM | POA: Insufficient documentation

## 2016-07-02 DIAGNOSIS — Z8249 Family history of ischemic heart disease and other diseases of the circulatory system: Secondary | ICD-10-CM | POA: Insufficient documentation

## 2016-07-02 DIAGNOSIS — N151 Renal and perinephric abscess: Secondary | ICD-10-CM | POA: Diagnosis not present

## 2016-07-02 DIAGNOSIS — R404 Transient alteration of awareness: Secondary | ICD-10-CM | POA: Diagnosis not present

## 2016-07-02 DIAGNOSIS — Z79899 Other long term (current) drug therapy: Secondary | ICD-10-CM | POA: Insufficient documentation

## 2016-07-02 DIAGNOSIS — R42 Dizziness and giddiness: Secondary | ICD-10-CM | POA: Diagnosis not present

## 2016-07-02 DIAGNOSIS — I700 Atherosclerosis of aorta: Secondary | ICD-10-CM | POA: Diagnosis not present

## 2016-07-02 DIAGNOSIS — Z9049 Acquired absence of other specified parts of digestive tract: Secondary | ICD-10-CM | POA: Insufficient documentation

## 2016-07-02 DIAGNOSIS — D631 Anemia in chronic kidney disease: Secondary | ICD-10-CM | POA: Diagnosis not present

## 2016-07-02 DIAGNOSIS — Z992 Dependence on renal dialysis: Secondary | ICD-10-CM | POA: Insufficient documentation

## 2016-07-02 DIAGNOSIS — N185 Chronic kidney disease, stage 5: Secondary | ICD-10-CM | POA: Diagnosis not present

## 2016-07-02 DIAGNOSIS — Z91018 Allergy to other foods: Secondary | ICD-10-CM | POA: Diagnosis not present

## 2016-07-02 DIAGNOSIS — Z888 Allergy status to other drugs, medicaments and biological substances status: Secondary | ICD-10-CM | POA: Insufficient documentation

## 2016-07-02 DIAGNOSIS — N186 End stage renal disease: Secondary | ICD-10-CM | POA: Diagnosis not present

## 2016-07-02 DIAGNOSIS — Z8719 Personal history of other diseases of the digestive system: Secondary | ICD-10-CM | POA: Diagnosis not present

## 2016-07-02 DIAGNOSIS — I4581 Long QT syndrome: Secondary | ICD-10-CM | POA: Diagnosis not present

## 2016-07-02 DIAGNOSIS — Z881 Allergy status to other antibiotic agents status: Secondary | ICD-10-CM | POA: Diagnosis not present

## 2016-07-02 DIAGNOSIS — I120 Hypertensive chronic kidney disease with stage 5 chronic kidney disease or end stage renal disease: Secondary | ICD-10-CM | POA: Insufficient documentation

## 2016-07-02 DIAGNOSIS — Z882 Allergy status to sulfonamides status: Secondary | ICD-10-CM | POA: Insufficient documentation

## 2016-07-02 DIAGNOSIS — I10 Essential (primary) hypertension: Secondary | ICD-10-CM | POA: Diagnosis present

## 2016-07-02 DIAGNOSIS — R112 Nausea with vomiting, unspecified: Secondary | ICD-10-CM | POA: Diagnosis not present

## 2016-07-02 DIAGNOSIS — K219 Gastro-esophageal reflux disease without esophagitis: Secondary | ICD-10-CM | POA: Insufficient documentation

## 2016-07-02 LAB — BASIC METABOLIC PANEL
ANION GAP: 9 (ref 5–15)
BUN: <5 mg/dL — ABNORMAL LOW (ref 6–20)
CHLORIDE: 97 mmol/L — AB (ref 101–111)
CO2: 29 mmol/L (ref 22–32)
Calcium: 7.7 mg/dL — ABNORMAL LOW (ref 8.9–10.3)
Creatinine, Ser: 1.97 mg/dL — ABNORMAL HIGH (ref 0.44–1.00)
GFR calc non Af Amer: 27 mL/min — ABNORMAL LOW (ref >60)
GFR, EST AFRICAN AMERICAN: 31 mL/min — AB (ref >60)
Glucose, Bld: 126 mg/dL — ABNORMAL HIGH (ref 65–99)
POTASSIUM: 3.3 mmol/L — AB (ref 3.5–5.1)
Sodium: 135 mmol/L (ref 135–145)

## 2016-07-02 LAB — CBC
HEMATOCRIT: 27.6 % — AB (ref 36.0–46.0)
HEMOGLOBIN: 8.7 g/dL — AB (ref 12.0–15.0)
MCH: 30.1 pg (ref 26.0–34.0)
MCHC: 31.5 g/dL (ref 30.0–36.0)
MCV: 95.5 fL (ref 78.0–100.0)
Platelets: 198 10*3/uL (ref 150–400)
RBC: 2.89 MIL/uL — AB (ref 3.87–5.11)
RDW: 15.6 % — ABNORMAL HIGH (ref 11.5–15.5)
WBC: 9.2 10*3/uL (ref 4.0–10.5)

## 2016-07-02 LAB — URINALYSIS, ROUTINE W REFLEX MICROSCOPIC
Bilirubin Urine: NEGATIVE
Glucose, UA: 150 mg/dL — AB
KETONES UR: NEGATIVE mg/dL
Nitrite: NEGATIVE
PROTEIN: 100 mg/dL — AB
Specific Gravity, Urine: 1.008 (ref 1.005–1.030)
pH: 9 — ABNORMAL HIGH (ref 5.0–8.0)

## 2016-07-02 LAB — MAGNESIUM: MAGNESIUM: 1.6 mg/dL — AB (ref 1.7–2.4)

## 2016-07-02 MED ORDER — MAGNESIUM SULFATE 2 GM/50ML IV SOLN
2.0000 g | Freq: Once | INTRAVENOUS | Status: AC
  Administered 2016-07-03: 2 g via INTRAVENOUS
  Filled 2016-07-02: qty 50

## 2016-07-02 MED ORDER — SODIUM CHLORIDE 0.9 % IV BOLUS (SEPSIS)
500.0000 mL | Freq: Once | INTRAVENOUS | Status: AC
  Administered 2016-07-02: 500 mL via INTRAVENOUS

## 2016-07-02 NOTE — ED Notes (Signed)
Pt still reports she is unable to urinate but will attempt after her fluid is complete.

## 2016-07-02 NOTE — ED Notes (Signed)
ED Provider at bedside. 

## 2016-07-02 NOTE — ED Triage Notes (Signed)
Pt from home with [facility] c/o syncope and emesis. Pt was at dialysis today with BP in 718Z systolic, pt was given both Bp medications at dialysis which brought her pressure down to 501 systolic. Pt got home and had a syncopal episode with vomiting. Pt vomited 3 times, 4mg  zofran given by EMS with vomiting resolved. Bp now 194/72. Other VSS. Nad at this time. Pt a/ox4

## 2016-07-02 NOTE — ED Notes (Signed)
Explained to pt need for urine sample for UA. Pt verbalized understanding but reports she does not have to void in this time but will try shortly.

## 2016-07-02 NOTE — ED Notes (Signed)
UA sent to lab, pt requested to stay on bed pan for BM.

## 2016-07-02 NOTE — ED Notes (Signed)
Called main lab to follow up on pt UA. Per lab, pt urine specimen received at 2244 and is currently in process.

## 2016-07-02 NOTE — ED Notes (Signed)
Pt placed on bedside commode. Pt reports she is has to void but would like some privacy. Pt given call light .

## 2016-07-02 NOTE — ED Provider Notes (Signed)
[place] DEPT Provider Note   CSN: [id number] Arrival date & time: 07/02/16  [place]     History   Chief Complaint Chief Complaint  Patient presents with  . Loss of Consciousness  . Hypertension    HPI Alison Baker is a 61 y.o. female with a history of HTN, GERD, borderline diabetes chronic renal failure who just started dialysis during her most recent hospitalization here, discharge 2/26 during which time she is also diagnosed with a left perirenal abscess, presenting with multiple episodes of syncope during and after today's dialysis session (right chest tunneled dialysis catheter).  She was halfway through her session when her systolic blood pressure started spiking into the 220 range, therefore she was given her morning blood pressure medications(which she normally holds on dialysis days) including labetalol and amlodipine.  Within 30 minutes of receiving his medications for systolic blood pressure dropped to 110 and she had syncope 1 before her dialysis was completed.  She was feeling better at the time of her discharge, but after she arrived home she had another syncopal event while sitting in a chair.  She denies palpitations, chest pain, shortness of breath, nausea or emesis during these episodes, but has vomited 1 in route here.  She is currently without complaint except for generalized fatigue.  She also denies visual changes, headache. She states she finished dialysis today, but is unsure of what her "dry weight).  The history is provided by the patient.    Past Medical History:  Diagnosis Date  . Anemia   . Chronic kidney disease   . Gastric ulcer with hemorrhage   . GERD (gastroesophageal reflux disease)   . Hypertension   . Pre-diabetes     Patient Active Problem List   Diagnosis Date Noted  . Perinephric abscess 06/15/2016  . Pyelonephritis   . Acute on chronic kidney failure ([place]) 06/13/2016  . Essential hypertension 06/13/2016  . Anemia of renal disease  06/13/2016  . GERD (gastroesophageal reflux disease) 06/13/2016  . Pre-diabetes 06/13/2016  . UTI (urinary tract infection) 06/13/2016  . Nausea and vomiting 06/13/2016    Past Surgical History:  Procedure Laterality Date  . ABDOMINAL HYSTERECTOMY    . CHOLECYSTECTOMY OPEN    . IR GENERIC HISTORICAL  06/23/2016   IR US GUIDE VASC ACCESS RIGHT 06/23/2016 Arne Cleveland, MD [facility]  . IR GENERIC HISTORICAL  06/23/2016   IR FLUORO GUIDE CV LINE RIGHT 06/23/2016 Arne Cleveland, MD [facility]  . TOTAL ABDOMINAL HYSTERECTOMY W/ BILATERAL SALPINGOOPHORECTOMY      OB History    No data available       Home Medications    Prior to Admission medications   Medication Sig Start Date End Date Taking? Authorizing Provider  acetaminophen (TYLENOL) 325 MG tablet Take 650 mg by mouth every 6 (six) hours as needed.    Historical Provider, MD  amLODipine (NORVASC) 10 MG tablet Take 10 mg by mouth. 09/09/15 09/08/16  Historical Provider, MD  calcium carbonate (TUMS - DOSED IN MG ELEMENTAL CALCIUM) 500 MG chewable tablet Chew 1 tablet by mouth daily.    Historical Provider, MD  ceFAZolin (ANCEF) 2-4 GM/100ML-% IVPB Inject 100 mLs (2 g total) into the vein every Monday, Wednesday, and Friday at 6 PM. 06/23/16 07/09/16  Domenic Polite, MD  famotidine (PEPCID) 20 MG tablet Take 1 tablet (20 mg total) by mouth at bedtime. 06/23/16   Domenic Polite, MD  hydrALAZINE (APRESOLINE) 25 MG tablet Take 1 tablet (25 mg total) by mouth  every 8 (eight) hours. 06/23/16   Domenic Polite, MD  labetalol (NORMODYNE) 300 MG tablet Take 1 tablet (300 mg total) by mouth 2 (two) times daily. 06/23/16   Domenic Polite, MD    Family History Family History  Problem Relation Age of Onset  . Hypertension Mother     Social History Social History  Substance Use Topics  . Smoking status: Former Smoker    Types: Cigars    Quit date: 06/27/1995  . Smokeless tobacco: Never Used  . Alcohol use No     Allergies     Strawberry extract; Nsaids; Sulfa antibiotics; Amoxicillin; Clonidine; and Penicillins   Review of Systems Review of Systems  Constitutional: Positive for fatigue. Negative for fever.  HENT: Negative for congestion and sore throat.   Eyes: Negative.   Respiratory: Negative for chest tightness and shortness of breath.   Cardiovascular: Negative for chest pain.  Gastrointestinal: Positive for nausea and vomiting. Negative for abdominal pain.  Genitourinary: Negative.   Musculoskeletal: Negative for arthralgias, joint swelling and neck pain.  Skin: Negative.  Negative for rash and wound.  Neurological: Positive for syncope. Negative for dizziness, weakness, light-headedness, numbness and headaches.  Psychiatric/Behavioral: Negative.      Physical Exam Updated Vital Signs BP 181/76   Pulse 79   Temp 99 F (37.2 C) (Oral)   Resp 12   Ht 5\' 3"  (1.6 m)   Wt 71.2 kg   SpO2 100%   BMI 27.81 kg/m   Physical Exam  Constitutional: She appears well-developed and well-nourished.  No distress, appears fatigued.  HENT:  Head: Normocephalic and atraumatic.  Eyes: Conjunctivae are normal.  Neck: Normal range of motion.  Cardiovascular: Normal rate, regular rhythm, normal heart sounds and intact distal pulses.   Pulmonary/Chest: Effort normal and breath sounds normal. She has no wheezes.  Abdominal: Soft. Bowel sounds are normal. There is no tenderness.  Musculoskeletal: Normal range of motion.  Neurological: She is alert.  Skin: Skin is warm and dry.  Psychiatric: She has a normal mood and affect.  Nursing note and vitals reviewed.    ED Treatments / Results  Labs (all labs ordered are listed, but only abnormal results are displayed) Labs Reviewed  BASIC METABOLIC PANEL - Abnormal; Notable for the following:       Result Value   Potassium 3.3 (*)    Chloride 97 (*)    Glucose, Bld 126 (*)    BUN <5 (*)    Creatinine, Ser 1.97 (*)    Calcium 7.7 (*)    GFR calc non Af  Amer 27 (*)    GFR calc Af Amer 31 (*)    All other components within normal limits  CBC - Abnormal; Notable for the following:    RBC 2.89 (*)    Hemoglobin 8.7 (*)    HCT 27.6 (*)    RDW 15.6 (*)    All other components within normal limits  URINALYSIS, ROUTINE W REFLEX MICROSCOPIC    EKG  EKG Interpretation None       Radiology No results found.  Procedures Procedures (including critical care time)  Medications Ordered in ED Medications  sodium chloride 0.9 % bolus 500 mL (not administered)     Initial Impression / Assessment and Plan / ED Course  I have reviewed the triage vital signs and the nursing notes.  Pertinent labs & imaging results that were available during my care of the patient were reviewed by me and considered in my  medical decision making (see chart for details).     Pt with multiple episodes of syncope during and since dialysis today.  Labs reviewed and stable, hematocrit less than 30%, making pt high risk for cardiac source of sx.  One episode of syncope may be explained by bp meds given during dialysis, but with an additional syncopal event at home with elevated bp per ems measurement, concerning for cardiac source.    Additionally, she is tilt positive on orthostatics obtained with 50 point systolic drop between lying and standing, felt slightly lightheaded with standing.  She may have had too much fluid drawn off during dialysis.  Will give fluid bolus then reassess.    Discussed with Dr. Ellender Hose who will reassess pt after fluid bolus given.  Final Clinical Impressions(s) / ED Diagnoses   Final diagnoses:  Syncope, unspecified syncope type    New Prescriptions New Prescriptions   No medications on file     Evalee Jefferson, PA-C 07/02/16 2012    Evalee Jefferson, PA-C 07/02/16 2059    Duffy Bruce, MD 07/03/16 1225

## 2016-07-02 NOTE — ED Notes (Addendum)
Pt ambulatory around nursing station without difficulty. Pt with steady gait, denies dizziness/lightheadedness.

## 2016-07-03 ENCOUNTER — Encounter (HOSPITAL_COMMUNITY): Payer: Self-pay | Admitting: Internal Medicine

## 2016-07-03 DIAGNOSIS — N186 End stage renal disease: Secondary | ICD-10-CM | POA: Diagnosis not present

## 2016-07-03 DIAGNOSIS — I10 Essential (primary) hypertension: Secondary | ICD-10-CM

## 2016-07-03 DIAGNOSIS — R7303 Prediabetes: Secondary | ICD-10-CM | POA: Diagnosis not present

## 2016-07-03 DIAGNOSIS — Z881 Allergy status to other antibiotic agents status: Secondary | ICD-10-CM | POA: Diagnosis not present

## 2016-07-03 DIAGNOSIS — R55 Syncope and collapse: Secondary | ICD-10-CM | POA: Diagnosis not present

## 2016-07-03 DIAGNOSIS — Z992 Dependence on renal dialysis: Secondary | ICD-10-CM | POA: Diagnosis not present

## 2016-07-03 DIAGNOSIS — N151 Renal and perinephric abscess: Secondary | ICD-10-CM | POA: Diagnosis not present

## 2016-07-03 LAB — CBC WITH DIFFERENTIAL/PLATELET
Basophils Absolute: 0 10*3/uL (ref 0.0–0.1)
Basophils Relative: 0 %
Eosinophils Absolute: 0.1 10*3/uL (ref 0.0–0.7)
Eosinophils Relative: 1 %
HEMATOCRIT: 27.1 % — AB (ref 36.0–46.0)
HEMOGLOBIN: 8.4 g/dL — AB (ref 12.0–15.0)
LYMPHS ABS: 1.2 10*3/uL (ref 0.7–4.0)
LYMPHS PCT: 16 %
MCH: 30.1 pg (ref 26.0–34.0)
MCHC: 31 g/dL (ref 30.0–36.0)
MCV: 97.1 fL (ref 78.0–100.0)
MONOS PCT: 14 %
Monocytes Absolute: 1.1 10*3/uL — ABNORMAL HIGH (ref 0.1–1.0)
NEUTROS ABS: 5.3 10*3/uL (ref 1.7–7.7)
Neutrophils Relative %: 69 %
Platelets: 220 10*3/uL (ref 150–400)
RBC: 2.79 MIL/uL — ABNORMAL LOW (ref 3.87–5.11)
RDW: 15.8 % — AB (ref 11.5–15.5)
WBC: 7.6 10*3/uL (ref 4.0–10.5)

## 2016-07-03 LAB — BASIC METABOLIC PANEL
Anion gap: 7 (ref 5–15)
BUN: <5 mg/dL — ABNORMAL LOW (ref 6–20)
CHLORIDE: 98 mmol/L — AB (ref 101–111)
CO2: 30 mmol/L (ref 22–32)
Calcium: 7.6 mg/dL — ABNORMAL LOW (ref 8.9–10.3)
Creatinine, Ser: 2.83 mg/dL — ABNORMAL HIGH (ref 0.44–1.00)
GFR calc Af Amer: 20 mL/min — ABNORMAL LOW (ref >60)
GFR, EST NON AFRICAN AMERICAN: 17 mL/min — AB (ref >60)
GLUCOSE: 192 mg/dL — AB (ref 65–99)
POTASSIUM: 3 mmol/L — AB (ref 3.5–5.1)
Sodium: 135 mmol/L (ref 135–145)

## 2016-07-03 LAB — MAGNESIUM: Magnesium: 2.4 mg/dL (ref 1.7–2.4)

## 2016-07-03 LAB — MRSA PCR SCREENING: MRSA BY PCR: NEGATIVE

## 2016-07-03 LAB — TROPONIN I
Troponin I: 0.03 ng/mL (ref <0.03)
Troponin I: <0.03 ng/mL (ref <0.03)

## 2016-07-03 LAB — HIV ANTIBODY (ROUTINE TESTING W REFLEX): HIV SCREEN 4TH GENERATION: NONREACTIVE

## 2016-07-03 MED ORDER — CEFAZOLIN SODIUM-DEXTROSE 2-4 GM/100ML-% IV SOLN: 2.0000 g | INTRAVENOUS | Status: DC

## 2016-07-03 MED ORDER — ACETAMINOPHEN 325 MG PO TABS: 650.0000 mg | ORAL_TABLET | Freq: Four times a day (QID) | ORAL | Status: DC | PRN

## 2016-07-03 MED ORDER — HYDRALAZINE HCL 25 MG PO TABS
25.0000 mg | ORAL_TABLET | Freq: Three times a day (TID) | ORAL | Status: DC
  Administered 2016-07-03: 25 mg via ORAL
  Filled 2016-07-03: qty 1

## 2016-07-03 MED ORDER — ACETAMINOPHEN 650 MG RE SUPP: 650.0000 mg | Freq: Four times a day (QID) | RECTAL | Status: DC | PRN

## 2016-07-03 MED ORDER — LABETALOL HCL 200 MG PO TABS
300.0000 mg | ORAL_TABLET | Freq: Two times a day (BID) | ORAL | Status: DC
  Administered 2016-07-03: 300 mg via ORAL
  Filled 2016-07-03: qty 1

## 2016-07-03 MED ORDER — FAMOTIDINE 20 MG PO TABS: 20.0000 mg | ORAL_TABLET | Freq: Every day | ORAL | Status: DC

## 2016-07-03 MED ORDER — AMLODIPINE BESYLATE 10 MG PO TABS
10.0000 mg | ORAL_TABLET | Freq: Every day | ORAL | Status: DC
  Administered 2016-07-03: 10 mg via ORAL
  Filled 2016-07-03: qty 1

## 2016-07-03 MED ORDER — HEPARIN SODIUM (PORCINE) 5000 UNIT/ML IJ SOLN: 5000.0000 [IU] | Freq: Three times a day (TID) | INTRAMUSCULAR | Status: DC

## 2016-07-03 NOTE — H&P (Signed)
History and Physical    Alison Baker JSH:[id number] DOB: 04/27/1956 DOA: 07/02/2016  PCP: Charletta Cousin, MD   Patient coming from: Home.  Chief Complaint: Syncopal episode.  HPI: Alison Baker is a 61 y.o. female with ESRD recently started on dialysis, chronic anemia, recently admitted from left perirenal abscess on Ancef had a syncopal episode with does by patient has been well at home. Patient states yesterday while having dialysis patient failed uncomfortable and dizzy. But did not pass out then. After reaching home patient was sitting on the couch when patient passed out for about 1 minute. Patient did not have any incontinence of urine or any seizure-like activities.   ED Course: In the ER EKG shows prolonged QT interval more than 500 ms. Potassium and medication levels were low. Patient denies any chest pain or shortness of breath.  Review of Systems: As per HPI, rest all negative.   Past Medical History:  Diagnosis Date  . Anemia   . Chronic kidney disease   . Gastric ulcer with hemorrhage   . GERD (gastroesophageal reflux disease)   . Hypertension   . Pre-diabetes     Past Surgical History:  Procedure Laterality Date  . ABDOMINAL HYSTERECTOMY    . CHOLECYSTECTOMY OPEN    . IR GENERIC HISTORICAL  06/23/2016   IR US GUIDE VASC ACCESS RIGHT 06/23/2016 Arne Cleveland, MD [facility]  . IR GENERIC HISTORICAL  06/23/2016   IR FLUORO GUIDE CV LINE RIGHT 06/23/2016 Arne Cleveland, MD [facility]  . TOTAL ABDOMINAL HYSTERECTOMY W/ BILATERAL SALPINGOOPHORECTOMY       reports that she quit smoking about 21 years ago. Her smoking use included Cigars. She has never used smokeless tobacco. She reports that she does not drink alcohol or use drugs.  Allergies  Allergen Reactions  . Strawberry Extract Swelling  . Nsaids Other (See Comments)    Has a healing stomach ulcer  . Sulfa Antibiotics     "makes my ribs hurt and sometimes I break out in a rash"  . Amoxicillin Rash  .  Clonidine Nausea And Vomiting  . Penicillins Rash    Family History  Problem Relation Age of Onset  . Hypertension Mother     Prior to Admission medications   Medication Sig Start Date End Date Taking? Authorizing Provider  acetaminophen (TYLENOL) 325 MG tablet Take 650 mg by mouth every 6 (six) hours as needed.   Yes Historical Provider, MD  amLODipine (NORVASC) 10 MG tablet Take 10 mg by mouth. 09/09/15 09/08/16 Yes Historical Provider, MD  ceFAZolin (ANCEF) 2-4 GM/100ML-% IVPB Inject 100 mLs (2 g total) into the vein every Monday, Wednesday, and Friday at 6 PM. 06/23/16 07/09/16 Yes Domenic Polite, MD  famotidine (PEPCID) 20 MG tablet Take 1 tablet (20 mg total) by mouth at bedtime. 06/23/16  Yes Domenic Polite, MD  hydrALAZINE (APRESOLINE) 25 MG tablet Take 1 tablet (25 mg total) by mouth every 8 (eight) hours. 06/23/16  Yes Domenic Polite, MD  labetalol (NORMODYNE) 300 MG tablet Take 1 tablet (300 mg total) by mouth 2 (two) times daily. 06/23/16  Yes Domenic Polite, MD    Physical Exam: Vitals:   07/03/16 0015 07/03/16 0030 07/03/16 0124 07/03/16 0412  BP: 185/80 185/80 (!) 182/101 (!) 171/63  Pulse: 80 78 79 73  Resp: 15 23 18 18   Temp:   99.1 F (37.3 C) 98.7 F (37.1 C)  TempSrc:   Oral Oral  SpO2: 98% 99% 100% 100%  Weight:  70.7 kg (155 lb 12.8 oz)   Height:   5\' 3"  (1.6 m)       Constitutional: Moderately built and nourished. Vitals:   07/03/16 0015 07/03/16 0030 07/03/16 0124 07/03/16 0412  BP: 185/80 185/80 (!) 182/101 (!) 171/63  Pulse: 80 78 79 73  Resp: 15 23 18 18   Temp:   99.1 F (37.3 C) 98.7 F (37.1 C)  TempSrc:   Oral Oral  SpO2: 98% 99% 100% 100%  Weight:   70.7 kg (155 lb 12.8 oz)   Height:   5\' 3"  (1.6 m)    Eyes: Anicteric no pallor. ENMT: No discharge from the ears eyes nose or mouth. Neck: No mass felt. No JVD appreciated. Respiratory: No rhonchi or crepitations. Cardiovascular: S1-S2. No murmurs appreciated. Abdomen: Soft nontender bowel  sounds present. Musculoskeletal: No edema. No joint effusion. Skin: No rash. Skin appears warm. Neurologic: Alert awake oriented to time place and person. Moves all extremities. Psychiatric: Appears normal. Normal affect.   Labs on Admission: I have personally reviewed following labs and imaging studies  CBC:  Recent Labs Lab 07/02/16 1940  WBC 9.2  HGB 8.7*  HCT 27.6*  MCV 95.5  PLT 779   Basic Metabolic Panel:  Recent Labs Lab 07/02/16 1940  NA 135  K 3.3*  CL 97*  CO2 29  GLUCOSE 126*  BUN <5*  CREATININE 1.97*  CALCIUM 7.7*  MG 1.6*   GFR: Estimated Creatinine Clearance: 29.3 mL/min (by C-G formula based on SCr of 1.97 mg/dL (H)). Liver Function Tests: No results for input(s): AST, ALT, ALKPHOS, BILITOT, PROT, ALBUMIN in the last 168 hours. No results for input(s): LIPASE, AMYLASE in the last 168 hours. No results for input(s): AMMONIA in the last 168 hours. Coagulation Profile: No results for input(s): INR, PROTIME in the last 168 hours. Cardiac Enzymes: No results for input(s): CKTOTAL, CKMB, CKMBINDEX, TROPONINI in the last 168 hours. BNP (last 3 results) No results for input(s): PROBNP in the last 8760 hours. HbA1C: No results for input(s): HGBA1C in the last 72 hours. CBG: No results for input(s): GLUCAP in the last 168 hours. Lipid Profile: No results for input(s): CHOL, HDL, LDLCALC, TRIG, CHOLHDL, LDLDIRECT in the last 72 hours. Thyroid Function Tests: No results for input(s): TSH, T4TOTAL, FREET4, T3FREE, THYROIDAB in the last 72 hours. Anemia Panel: No results for input(s): VITAMINB12, FOLATE, FERRITIN, TIBC, IRON, RETICCTPCT in the last 72 hours. Urine analysis:    Component Value Date/Time   COLORURINE YELLOW 07/02/2016 2244   APPEARANCEUR CLOUDY (A) 07/02/2016 2244   LABSPEC 1.008 07/02/2016 2244   PHURINE 9.0 (H) 07/02/2016 2244   GLUCOSEU 150 (A) 07/02/2016 2244   HGBUR SMALL (A) 07/02/2016 2244   BILIRUBINUR NEGATIVE 07/02/2016  [place] 07/02/2016 2244   PROTEINUR 100 (A) 07/02/2016 2244   NITRITE NEGATIVE 07/02/2016 2244   LEUKOCYTESUR LARGE (A) 07/02/2016 2244   Sepsis Labs: @LABRCNTIP (procalcitonin:4,lacticidven:4) )No results found for this or any previous visit (from the past 240 hour(s)).   Radiological Exams on Admission: No results found.  EKG: Independently reviewed. Normal sinus rhythm with prolonged QTC.  Assessment/Plan Principal Problem:   Syncope Active Problems:   Essential hypertension   ESRD on dialysis ([place])    1. Syncope - EKG shows prolonged QTC. Patient's syncopal episode could be from brief episode of hypotension after dialysis. Presently hypertensive. Since patient has prolonged QTC with low magnesium and potassium arrhythmia has to be ruled out. Follow up in telemetry. Replace  potassium and magnesium and recheck. Check 2-D echo. 2. Recently diagnosed perirenal abscess - patient is on Ancef. As per discharge note on February 26 patient is supposed to be on 3 weeks of Ancef. 3. ESRD on hemodialysis - on Monday Wednesday and Friday. Patient had dialysis yesterday. 4. Hypertension - uncontrolled. Patient states she does not take antihypertensives on the dialysis days as instructed. Patient is on amlodipine and hydralazine and labetalol. Closely follow blood pressure trends.   DVT prophylaxis: Heparin. Code Status: Full code.  Family Communication: Discussed with patient's husband.  Disposition Plan: Home.  Consults called: None.  Admission status: Observation.    Rise Patience MD [facility] Pager [phone].  If 7PM-7AM, please contact night-coverage [email or web address] Password [facility]  07/03/2016, 4:42 AM

## 2016-07-03 NOTE — Discharge Summary (Signed)
Alison Baker YQI:[id number] DOB: 01-11-1956 DOA: 07/02/2016  PCP: Charletta Cousin, MD  Admit date: 07/02/2016  Discharge date: 07/03/2016  Admitted From: Home   Disposition:  Home   Recommendations for Outpatient Follow-up:   Follow up with PCP in 1-2 weeks  PCP Please obtain BMP/CBC, 2 view CXR in 1week,  (see Discharge instructions)   PCP Please follow up on the following pending results: None   Home Health: None   Equipment/Devices: None  Consultations: None Discharge Condition: Stable   CODE STATUS: Full   Diet Recommendation: Diet renal with fluid restriction Fluid restriction: 1200 mL/ day  Chief Complaint  Patient presents with  . Loss of Consciousness  . Hypertension     Brief history of present illness from the day of admission and additional interim summary    Alison Baker is a 61 y.o. female with ESRD recently started on dialysis, chronic anemia, recently admitted from left perirenal abscess on Ancef had a syncopal episode with does by patient has been well at home. Patient states yesterday while having dialysis patient failed uncomfortable and dizzy. But did not pass out then. After reaching home patient was sitting on the couch when patient passed out for about 1 minute. Patient did not have any incontinence of urine or any seizure-like activities.                                                                  Hospital Course    1. Syncope - Due to relative drop in blood pressure prior to and after dialysis and taking her blood pressure medications, according to the patient yesterday morning before dialysis her blood pressure was 220/110 which is her normal baseline blood pressure, she went and got dialysis done and thereafter came home and took her blood pressure medications, an hour after that her  blood pressure systolic was around 387 which was greater than 100 points drop. She then got lightheaded when she tried to stand up and presented to the hospital. Would recommend PCP at her nephrologist to kindly reconsider her blood pressure medication regimen around dialysis today. Probably to be better to take 1 medication prior to dialysis and 1 after 2 minimize the large variation in the blood pressure readings. At this time she is symptom-free, she had a recent echocardiogram which was unremarkable, she will ambulate in the hallway remained stable on telemetry.   Her QTC was prolonged in view of electrolyte abnormalities but that has considerably improved after potassium was replaced. Her QTC was 564 at the time of admission and is close to 500 at this time. She will be discharged with close PCP and nephrology follow-up. Requests both physicians to monitor blood pressure variations and electrolytes closely.  2. ESRD. Recently started on dialysis. Continue outpatient dialysis regimen. Last dialysis  run was yesterday.  3. Renal abscess. Finishing antibiotic regimen.  4. Hypertension. Continue home regimen and follow with PCP and nephrologist for blood pressure medication adjustment as above.    Discharge diagnosis     Principal Problem:   Syncope Active Problems:   Essential hypertension   ESRD on dialysis [facility])    Discharge instructions    Discharge Instructions    Diet - low sodium heart healthy    Complete by:  As directed    Discharge instructions    Complete by:  As directed    Follow with Primary MD Charletta Cousin, MD in 7 days   Get CBC, CMP, 2 view Chest X ray checked  by Primary MD or SNF MD in 5-7 days ( we routinely change or add medications that can affect your baseline labs and fluid status, therefore we recommend that you get the mentioned basic workup next visit with your PCP, your PCP may decide not to get them or add new tests based on their clinical  decision)  Activity: As tolerated with Full fall precautions use walker/cane & assistance as needed  Disposition Home   Diet:   Diet renal with fluid restriction Fluid restriction: 1200 mL/days    On your next visit with your primary care physician please Get Medicines reviewed and adjusted.  Please request your Prim.MD to go over all Hospital Tests and Procedure/Radiological results at the follow up, please get all Hospital records sent to your Prim MD by signing hospital release before you go home.  If you experience worsening of your admission symptoms, develop shortness of breath, life threatening emergency, suicidal or homicidal thoughts you must seek medical attention immediately by calling 911 or calling your MD immediately  if symptoms less severe.  You Must read complete instructions/literature along with all the possible adverse reactions/side effects for all the Medicines you take and that have been prescribed to you. Take any new Medicines after you have completely understood and accpet all the possible adverse reactions/side effects.   Do not drive, operate heavy machinery, perform activities at heights, swimming or participation in water activities or provide baby sitting services if your were admitted for syncope or siezures until you have seen by Primary MD or a Neurologist and advised to do so again.  Do not drive when taking Pain medications.    Do not take more than prescribed Pain, Sleep and Anxiety Medications  Special Instructions: If you have smoked or chewed Tobacco  in the last 2 yrs please stop smoking, stop any regular Alcohol  and or any Recreational drug use.  Wear Seat belts while driving.   Please note  You were cared for by a hospitalist during your hospital stay. If you have any questions about your discharge medications or the care you received while you were in the hospital after you are discharged, you can call the unit and asked to speak with the  hospitalist on call if the hospitalist that took care of you is not available. Once you are discharged, your primary care physician will handle any further medical issues. Please note that NO REFILLS for any discharge medications will be authorized once you are discharged, as it is imperative that you return to your primary care physician (or establish a relationship with a primary care physician if you do not have one) for your aftercare needs so that they can reassess your need for medications and monitor your lab values.   Increase activity slowly  Complete by:  As directed       Discharge Medications   Allergies as of 07/03/2016      Reactions   Strawberry Extract Swelling   Nsaids Other (See Comments)   Has a healing stomach ulcer   Sulfa Antibiotics    "makes my ribs hurt and sometimes I break out in a rash"   Amoxicillin Rash   Clonidine Nausea And Vomiting   Penicillins Rash      Medication List    TAKE these medications   acetaminophen 325 MG tablet Commonly known as:  TYLENOL Take 650 mg by mouth every 6 (six) hours as needed.   amLODipine 10 MG tablet Commonly known as:  NORVASC Take 10 mg by mouth.   ceFAZolin 2-4 GM/100ML-% IVPB Commonly known as:  ANCEF Inject 100 mLs (2 g total) into the vein every Monday, Wednesday, and Friday at 6 PM.   famotidine 20 MG tablet Commonly known as:  PEPCID Take 1 tablet (20 mg total) by mouth at bedtime.   hydrALAZINE 25 MG tablet Commonly known as:  APRESOLINE Take 1 tablet (25 mg total) by mouth every 8 (eight) hours.   labetalol 300 MG tablet Commonly known as:  NORMODYNE Take 1 tablet (300 mg total) by mouth 2 (two) times daily.       Follow-up Information    Charletta Cousin, MD. Go on 07/07/2016.   Specialty:  Family Medicine Why:  @11 :Max Sane information: [place] [address] [phone]           Major procedures and Radiology Reports - PLEASE review detailed and final reports  thoroughly  -      TTE  Left ventricle: The cavity size was normal. Systolic function was normal. The estimated ejection fraction was in the range of 55% to 60%. Wall motion was normal; there were no regional wall motion abnormalities. Doppler parameters are consistent with both elevated ventricular end-diastolic filling pressure and elevated left atrial filling pressure. - Left atrium: The atrium was mildly dilated. - Atrial septum: Likely PFO consider bubble study if clinically indicated.   Ct Abdomen Pelvis W Wo Contrast  Result Date: 06/15/2016 CLINICAL DATA:  Renal mass on ultrasound. EXAM: CT ABDOMEN AND PELVIS WITHOUT AND WITH CONTRAST TECHNIQUE: Multidetector CT imaging of the abdomen and pelvis was performed following the standard protocol before and following the bolus administration of intravenous contrast. CONTRAST:  170mL ISOVUE-300 IOPAMIDOL (ISOVUE-300) INJECTION 61% COMPARISON:  Must cell a Sim, ultrasound exam 06/14/2016 FINDINGS: Lower chest:  Unremarkable. Hepatobiliary: No focal abnormality within the liver parenchyma. Gallbladder surgically absent. No intrahepatic or extrahepatic biliary dilation. Pancreas: No focal mass lesion. No dilatation of the main duct. No intraparenchymal cyst. No peripancreatic edema. Spleen: No splenomegaly. No focal mass lesion. Adrenals/Urinary Tract: No adrenal nodule or mass. Wall thickening is noted in the right renal pelvis (see image 42 series 13. No discrete intraparenchymal mass lesion identified within the right kidney. Left kidney demonstrates heterogeneous enhancement mainly in the upper pole and interpolar region and there is prominent left perinephric edema/inflammation. A multiloculated 4.0 x 2.0 x 3.7 cm rim enhancing fluid collections identified anterior to the interpolar left kidney and tracks down towards the lower pole. This is associated with wall thickening in the renal pelvis and renal calices of the left kidney. Contrast excretion is  delayed from both kidneys. No evidence for hydroureter. Circumferential bladder wall thickening noted. Gas in the urinary bladder may be related to recent instrumentation  or infection. Stomach/Bowel: Stomach is nondistended. No gastric wall thickening. No evidence of outlet obstruction. Duodenum is normally positioned as is the ligament of Treitz. No small bowel wall thickening. No small bowel dilatation. The terminal ileum is normal. The appendix is not visualized, but there is no edema or inflammation in the region of the cecum. Diverticular changes are noted in the left colon without evidence of diverticulitis. Vascular/Lymphatic: There is abdominal aortic atherosclerosis without aneurysm. Small lymph nodes identified gastrohepatic and hepatoduodenal ligaments. Small left para-aortic lymph nodes are identified and measure up to 9 mm short axis which is borderline increased. No pelvic sidewall lymphadenopathy. Reproductive: Uterus surgically absent.  There is no adnexal mass. Other: No intraperitoneal free fluid. Musculoskeletal: Midline fat show laxity evident. Bone windows reveal no worrisome lytic or sclerotic osseous lesions. IMPRESSION: 1. Wall thickening identified and the renal pelvis of each kidney with probable caliceal wall thickening in the left kidney. Changes are associated with circumferential bladder wall thickening and gas in the bladder lumen. Given bilateral involvement, infectious etiology favored over other possibilities such as urothelial neoplasm. 2. Heterogeneous perfusion left kidney likely related to infection/pyelonephritis. 3. 4.0 x 2.0 x 3.7 cm rim enhancing multiloculated fluid collection identified in the perinephric fat anterior to the left kidney. Perinephric abscess could have this appearance. Given delayed contrast excretion, the left intrarenal collecting system and renal pelvis is not opacified despite repeat delayed imaging, and urinoma cannot be entirely excluded. 4.  Borderline retroperitoneal lymphadenopathy in the abdomen. Electronically Signed   By: Misty Stanley M.D.   On: 06/15/2016 18:42   US Renal  Result Date: 06/14/2016 CLINICAL DATA:  Patient with bilateral pyelonephritis. Evolving left perinephric abscess on CT. EXAM: RENAL / URINARY TRACT ULTRASOUND COMPLETE COMPARISON:  CT abdomen pelvis 06/12/2016. FINDINGS: Right Kidney: Length: 10.4 cm. Normal renal cortical thickness echogenicity. Mild hydronephrosis. Left Kidney: Length: 10.9 cm. Normal renal cortical thickness and echogenicity. Mild hydronephrosis. No definite pararenal mass identified. Bladder: Decompressed with Foley catheter. IMPRESSION: Mild bilateral hydronephrosis. No definite mass identified about the left kidney to correspond with previously described perinephric mass on prior CT. As previously recommended, when patient clinically able, recommend correlation with pre and post contrast-enhanced CT or MRI. Electronically Signed   By: Lovey Newcomer M.D.   On: 06/14/2016 14:45   Ir Fluoro Guide Cv Line Right  Result Date: 06/23/2016 CLINICAL DATA:  Renal insufficiency, needs durable venous access for hemodialysis EXAM: TUNNELED HEMODIALYSIS CATHETER PLACEMENT WITH ULTRASOUND AND FLUOROSCOPIC GUIDANCE TECHNIQUE: The procedure, risks, benefits, and alternatives were explained to the patient. Questions regarding the procedure were encouraged and answered. The patient understands and consents to the procedure. As antibiotic prophylaxis, cefazolin 2 g was ordered pre-procedure and administered intravenously within one hour of incision.Patency of the right IJ vein was confirmed with ultrasound with image documentation. An appropriate skin site was determined. Region was prepped using maximum barrier technique including cap and mask, sterile gown, sterile gloves, large sterile sheet, and Chlorhexidine as cutaneous antisepsis. The region was infiltrated locally with 1% lidocaine. Intravenous Fentanyl and  Versed were administered as conscious sedation during continuous monitoring of the patient's level of consciousness and physiological / cardiorespiratory status by the radiology RN, with a total moderate sedation time of 10 minutes. Under real-time ultrasound guidance, the right IJ vein was accessed with a 21 gauge micropuncture needle; the needle tip within the vein was confirmed with ultrasound image documentation. Needle exchanged over the 018 guidewire for transitional dilator, which allowed advancement of a  Benson wire into the IVC. Over this, an MPA catheter was advanced. A Palindrome 19 hemodialysis catheter was tunneled from the right anterior chest wall approach to the right IJ dermatotomy site. The MPA catheter was exchanged over an Amplatz wire for serial vascular dilators which allow placement of a peel-away sheath, through which the catheter was advanced under intermittent fluoroscopy, positioned with its tips in the proximal and midright atrium. Spot chest radiograph confirms good catheter position. No pneumothorax. Catheter was flushed and primed per protocol. Catheter secured externally with O Prolene sutures. The right IJ dermatotomy site was closed with Dermabond. COMPLICATIONS: COMPLICATIONS None immediate FLUOROSCOPY TIME:  12 seconds (1 mGy) COMPARISON:  None IMPRESSION: 1. Technically successful placement of tunneled right IJ hemodialysis catheter with ultrasound and fluoroscopic guidance. Ready for routine use. ACCESS: Remains approachable for percutaneous intervention as needed. Electronically Signed   By: Lucrezia Europe M.D.   On: 06/23/2016 16:24   Ir US Guide Vasc Access Right  Result Date: 06/23/2016 CLINICAL DATA:  Renal insufficiency, needs durable venous access for hemodialysis EXAM: TUNNELED HEMODIALYSIS CATHETER PLACEMENT WITH ULTRASOUND AND FLUOROSCOPIC GUIDANCE TECHNIQUE: The procedure, risks, benefits, and alternatives were explained to the patient. Questions regarding the  procedure were encouraged and answered. The patient understands and consents to the procedure. As antibiotic prophylaxis, cefazolin 2 g was ordered pre-procedure and administered intravenously within one hour of incision.Patency of the right IJ vein was confirmed with ultrasound with image documentation. An appropriate skin site was determined. Region was prepped using maximum barrier technique including cap and mask, sterile gown, sterile gloves, large sterile sheet, and Chlorhexidine as cutaneous antisepsis. The region was infiltrated locally with 1% lidocaine. Intravenous Fentanyl and Versed were administered as conscious sedation during continuous monitoring of the patient's level of consciousness and physiological / cardiorespiratory status by the radiology RN, with a total moderate sedation time of 10 minutes. Under real-time ultrasound guidance, the right IJ vein was accessed with a 21 gauge micropuncture needle; the needle tip within the vein was confirmed with ultrasound image documentation. Needle exchanged over the 018 guidewire for transitional dilator, which allowed advancement of a Benson wire into the IVC. Over this, an MPA catheter was advanced. A Palindrome 19 hemodialysis catheter was tunneled from the right anterior chest wall approach to the right IJ dermatotomy site. The MPA catheter was exchanged over an Amplatz wire for serial vascular dilators which allow placement of a peel-away sheath, through which the catheter was advanced under intermittent fluoroscopy, positioned with its tips in the proximal and midright atrium. Spot chest radiograph confirms good catheter position. No pneumothorax. Catheter was flushed and primed per protocol. Catheter secured externally with O Prolene sutures. The right IJ dermatotomy site was closed with Dermabond. COMPLICATIONS: COMPLICATIONS None immediate FLUOROSCOPY TIME:  12 seconds (1 mGy) COMPARISON:  None IMPRESSION: 1. Technically successful placement of  tunneled right IJ hemodialysis catheter with ultrasound and fluoroscopic guidance. Ready for routine use. ACCESS: Remains approachable for percutaneous intervention as needed. Electronically Signed   By: Lucrezia Europe M.D.   On: 06/23/2016 16:24   Dg Chest Port 1 View  Result Date: 06/13/2016 CLINICAL DATA:  Central catheter placement EXAM: PORTABLE CHEST 1 VIEW COMPARISON:  None. FINDINGS: Central catheter tip is in the superior vena cava. No pneumothorax. There is no edema or consolidation. There is slight atelectasis in the lateral left base. Heart is upper normal in size with pulmonary vascularity within normal limits. No adenopathy. No bone lesions. IMPRESSION: Central catheter tip in superior  vena cava. No pneumothorax. Slight left base atelectasis. Lungs elsewhere clear. Electronically Signed   By: Lowella Grip III M.D.   On: 06/13/2016 15:48    Micro Results     Recent Results (from the past 240 hour(s))  MRSA PCR Screening     Status: None   Collection Time: 07/03/16  1:42 AM  Result Value Ref Range Status   MRSA by PCR NEGATIVE NEGATIVE Final    Comment:        The GeneXpert MRSA Assay (FDA approved for NASAL specimens only), is one component of a comprehensive MRSA colonization surveillance program. It is not intended to diagnose MRSA infection nor to guide or monitor treatment for MRSA infections.     Today   Subjective    Alison Baker today has no headache,no chest abdominal pain,no new weakness tingling or numbness, feels much better wants to go home today.     Objective   Blood pressure (!) 171/63, pulse 73, temperature 98.7 F (37.1 C), temperature source Oral, resp. rate 18, height 5\' 3"  (1.6 m), weight 70.7 kg (155 lb 12.8 oz), SpO2 100 %.   Intake/Output Summary (Last 24 hours) at 07/03/16 1213 Last data filed at 07/03/16 0650  Gross per 24 hour  Intake              240 ml  Output                0 ml  Net              240 ml    Exam Awake Alert,  Oriented x 3, No new F.N deficits, Normal affect [place].AT,PERRAL Supple Neck,No JVD, No cervical lymphadenopathy appriciated.  Symmetrical Chest wall movement, Good air movement bilaterally, CTAB RRR,No Gallops,Rubs or new Murmurs, No Parasternal Heave +ve B.Sounds, Abd Soft, Non tender, No organomegaly appriciated, No rebound -guarding or rigidity. No Cyanosis, Clubbing or edema, No new Rash or bruise   Data Review   CBC w Diff: Lab Results  Component Value Date   WBC 7.6 07/03/2016   HGB 8.4 (L) 07/03/2016   HCT 27.1 (L) 07/03/2016   PLT 220 07/03/2016   LYMPHOPCT 16 07/03/2016   MONOPCT 14 07/03/2016   EOSPCT 1 07/03/2016   BASOPCT 0 07/03/2016    CMP: Lab Results  Component Value Date   NA 135 07/03/2016   K 3.0 (L) 07/03/2016   CL 98 (L) 07/03/2016   CO2 30 07/03/2016   BUN <5 (L) 07/03/2016   CREATININE 2.83 (H) 07/03/2016   PROT 5.8 (L) 06/13/2016   ALBUMIN 2.0 (L) 06/23/2016   BILITOT 1.5 (H) 06/13/2016   ALKPHOS 97 06/13/2016   AST 11 (L) 06/13/2016   ALT 10 (L) 06/13/2016  .   Total Time in preparing paper work, data evaluation and todays exam - 35 minutes  Thurnell Lose M.D on 07/03/2016 at 12:13 PM  [facility]   Office  [phone]

## 2016-07-03 NOTE — Progress Notes (Signed)
   07/03/16 0124  Vitals  Temp 99.1 F (37.3 C)  Temp Source Oral  BP (!) 182/101  BP Location Left Arm  BP Method Automatic  Patient Position (if appropriate) Lying  Pulse Rate 79  Pulse Rate Source Dinamap  Resp 18  Oxygen Therapy  SpO2 100 %  O2 Device Room Air  Height and Weight  Height 5\' 3"  (1.6 m)  Weight 70.7 kg (155 lb 12.8 oz)  Type of Scale Used Standing  Type of Weight Actual  BSA (Calculated - sq m) 1.77 sq meters  BMI (Calculated) 27.7  Weight in (lb) to have BMI = 25 140.8  Admitted pt to rm 3E22 from ED, pt alert and oriented, denied pain at this time, oriented to room, call bell placed within reach, placed on cardiac monitor, CCMD made aware. Pt's BP high, Dr. Hal Hope made aware.

## 2016-07-03 NOTE — Plan of Care (Signed)
Problem: Acute Rehab PT Goals(only PT should resolve) Goal: Pt Will Ambulate With no drops in BP, pulse or O2 sats

## 2016-07-03 NOTE — Discharge Instructions (Signed)
Follow with Primary MD Charletta Cousin, MD in 7 days   Get CBC, CMP, 2 view Chest X ray checked  by Primary MD or SNF MD in 5-7 days ( we routinely change or add medications that can affect your baseline labs and fluid status, therefore we recommend that you get the mentioned basic workup next visit with your PCP, your PCP may decide not to get them or add new tests based on their clinical decision)  Activity: As tolerated with Full fall precautions use walker/cane & assistance as needed  Disposition Home   Diet:   Diet renal with fluid restriction Fluid restriction: 1200 mL/days    On your next visit with your primary care physician please Get Medicines reviewed and adjusted.  Please request your Prim.MD to go over all Hospital Tests and Procedure/Radiological results at the follow up, please get all Hospital records sent to your Prim MD by signing hospital release before you go home.  If you experience worsening of your admission symptoms, develop shortness of breath, life threatening emergency, suicidal or homicidal thoughts you must seek medical attention immediately by calling 911 or calling your MD immediately  if symptoms less severe.  You Must read complete instructions/literature along with all the possible adverse reactions/side effects for all the Medicines you take and that have been prescribed to you. Take any new Medicines after you have completely understood and accpet all the possible adverse reactions/side effects.   Do not drive, operate heavy machinery, perform activities at heights, swimming or participation in water activities or provide baby sitting services if your were admitted for syncope or siezures until you have seen by Primary MD or a Neurologist and advised to do so again.  Do not drive when taking Pain medications.    Do not take more than prescribed Pain, Sleep and Anxiety Medications  Special Instructions: If you have smoked or chewed Tobacco  in the last 2 yrs  please stop smoking, stop any regular Alcohol  and or any Recreational drug use.  Wear Seat belts while driving.   Please note  You were cared for by a hospitalist during your hospital stay. If you have any questions about your discharge medications or the care you received while you were in the hospital after you are discharged, you can call the unit and asked to speak with the hospitalist on call if the hospitalist that took care of you is not available. Once you are discharged, your primary care physician will handle any further medical issues. Please note that NO REFILLS for any discharge medications will be authorized once you are discharged, as it is imperative that you return to your primary care physician (or establish a relationship with a primary care physician if you do not have one) for your aftercare needs so that they can reassess your need for medications and monitor your lab values.

## 2016-07-03 NOTE — Progress Notes (Signed)
Pt has orders to be discharged. Discharge instructions given and pt has no additional questions at this time. Medication regimen reviewed and pt educated. Pt verbalized understanding and has no additional questions. Telemetry box removed. IV removed and site in good condition. Pt stable and waiting for transportation.   Eara Burruel RN 

## 2016-07-03 NOTE — Evaluation (Addendum)
Physical Therapy Evaluation Patient Details Name: Alison Baker MRN: [id number] DOB: January 20, 1956 Today's Date: 07/03/2016   History of Present Illness  61 yo female was admitted and noted her syncope was well after HD, at home where she suddenly passed out without ongoing dizziness.  Has low Hgb despite transfusion.  PMHx:  ESRD, HD, CKD, HTN, recent HD patient  Clinical Impression  Pt is up to walk with PT noting vitals as normal range, but is mis-stepping at one point, due to LE numbness per pt.  Her doctor per pt report has recommended her to watch it and attributes to LE edema, which is improving from HD as is numbness.  Will keep on acute list to walk and observe her, ck vitals and will be able to get hoem with no follow up therapy.    Follow Up Recommendations No PT follow up    Equipment Recommendations  None recommended by PT    Recommendations for Other Services       Precautions / Restrictions Precautions Precautions: Fall (telemetry) Restrictions Weight Bearing Restrictions: No      Mobility  Bed Mobility Overal bed mobility: Modified Independent             General bed mobility comments: HOB elevated but no help needed  Transfers Overall transfer level: Modified independent Equipment used: None             General transfer comment: able to stand without assistance  Ambulation/Gait Ambulation/Gait assistance: Supervision;Min guard Ambulation Distance (Feet): 115 Feet Assistive device: None Gait Pattern/deviations: Step-through pattern;Narrow base of support;Trunk flexed;Decreased stride length Gait velocity: reduced Gait velocity interpretation: Below normal speed for age/gender General Gait Details: minor misstep on R foot but reports LE numbness, no LOB noted  Stairs            Wheelchair Mobility    Modified Rankin (Stroke Patients Only)       Balance Overall balance assessment: No apparent balance deficits (not formally assessed)                                           Pertinent Vitals/Pain Pain Assessment: No/denies pain    Home Living Family/patient expects to be discharged to:: Private residence Living Arrangements: Spouse/significant other Available Help at Discharge: Family;Available 24 hours/day Type of Home: House Home Access: Ramped entrance     Home Layout: One level Home Equipment: Walker - 2 wheels;Walker - 4 wheels;Cane - single point;Wheelchair - manual Additional Comments: had ramp built and equipment purchased due to sick older relative being there    Prior Function Level of Independence: Independent               Hand Dominance   Dominant Hand: Right    Extremity/Trunk Assessment   Upper Extremity Assessment Upper Extremity Assessment: Overall WFL for tasks assessed    Lower Extremity Assessment Lower Extremity Assessment: Overall WFL for tasks assessed    Cervical / Trunk Assessment Cervical / Trunk Assessment: Normal  Communication   Communication: No difficulties  Cognition Arousal/Alertness: Awake/alert Behavior During Therapy: WFL for tasks assessed/performed Overall Cognitive Status: Within Functional Limits for tasks assessed                      General Comments General comments (skin integrity, edema, etc.): pt is walking with sufficient control but with recent episode was ck'd for  vitals, BP WFL and pulse with O2 noted pre and post 98% and pulse 60's    Exercises     Assessment/Plan    PT Assessment Patient needs continued PT services  PT Problem List Decreased balance;Decreased mobility;Decreased coordination;Decreased range of motion;Decreased safety awareness;Cardiopulmonary status limiting activity;Other (comment) (suspected low BP with recent episode)       PT Treatment Interventions DME instruction;Gait training;Functional mobility training;Therapeutic activities;Therapeutic exercise;Balance training;Neuromuscular  re-education;Patient/family education    PT Goals (Current goals can be found in the Care Plan section)  Acute Rehab PT Goals Patient Stated Goal: to feel better and get home PT Goal Formulation: With patient/family Time For Goal Achievement: 07/10/16 Potential to Achieve Goals: Good    Frequency Min 2X/week   Barriers to discharge   no barriers, no PT needed at home    Co-evaluation               End of Session Equipment Utilized During Treatment: Gait belt Activity Tolerance: Patient tolerated treatment well Patient left: in bed;with call bell/phone within reach;with family/visitor present;with nursing/sitter in room (sitting side of bed then in bed) Nurse Communication: Mobility status PT Visit Diagnosis: Difficulty in walking, not elsewhere classified (R26.2)    Functional Assessment Tool Used: AM-PAC 6 Clicks Basic Mobility;Clinical judgement Functional Limitation: Mobility: Walking and moving around Mobility: Walking and Moving Around Current Status (L3810): At least 20 percent but less than 40 percent impaired, limited or restricted Mobility: Walking and Moving Around Goal Status [phone]): At least 1 percent but less than 20 percent impaired, limited or restricted    Time: 0843-0906 PT Time Calculation (min) (ACUTE ONLY): 23 min   Charges:   PT Evaluation $PT Eval Low Complexity: 1 Procedure PT Treatments $Gait Training: 8-22 mins   PT G Codes:   PT G-Codes **NOT FOR INPATIENT CLASS** Functional Assessment Tool Used: AM-PAC 6 Clicks Basic Mobility;Clinical judgement Functional Limitation: Mobility: Walking and moving around Mobility: Walking and Moving Around Current Status (C5852): At least 20 percent but less than 40 percent impaired, limited or restricted Mobility: Walking and Moving Around Goal Status [phone]): At least 1 percent but less than 20 percent impaired, limited or restricted     Ramond Dial 07/03/2016, 12:19 PM   Mee Hives, PT MS Acute  Rehab Dept. Number: [place] and [place]

## 2016-07-03 NOTE — Progress Notes (Signed)
   07/03/16 0659  Orthostatic Lying   BP- Lying (!) 204/73  Pulse- Lying 70  Pt's BP elevated, pt asymptomatic. Dr. Candiss Norse notified, awaiting call back.

## 2016-07-04 DIAGNOSIS — N151 Renal and perinephric abscess: Secondary | ICD-10-CM | POA: Diagnosis not present

## 2016-07-04 DIAGNOSIS — Z23 Encounter for immunization: Secondary | ICD-10-CM | POA: Diagnosis not present

## 2016-07-04 DIAGNOSIS — N186 End stage renal disease: Secondary | ICD-10-CM | POA: Diagnosis not present

## 2016-07-07 DIAGNOSIS — N186 End stage renal disease: Secondary | ICD-10-CM | POA: Diagnosis not present

## 2016-07-07 DIAGNOSIS — N151 Renal and perinephric abscess: Secondary | ICD-10-CM | POA: Diagnosis not present

## 2016-07-08 DIAGNOSIS — R55 Syncope and collapse: Secondary | ICD-10-CM | POA: Diagnosis not present

## 2016-07-08 DIAGNOSIS — I132 Hypertensive heart and chronic kidney disease with heart failure and with stage 5 chronic kidney disease, or end stage renal disease: Secondary | ICD-10-CM | POA: Diagnosis not present

## 2016-07-08 DIAGNOSIS — N185 Chronic kidney disease, stage 5: Secondary | ICD-10-CM | POA: Diagnosis not present

## 2016-07-08 DIAGNOSIS — K432 Incisional hernia without obstruction or gangrene: Secondary | ICD-10-CM | POA: Diagnosis not present

## 2016-07-09 DIAGNOSIS — N151 Renal and perinephric abscess: Secondary | ICD-10-CM | POA: Diagnosis not present

## 2016-07-09 DIAGNOSIS — N186 End stage renal disease: Secondary | ICD-10-CM | POA: Diagnosis not present

## 2016-07-11 DIAGNOSIS — N151 Renal and perinephric abscess: Secondary | ICD-10-CM | POA: Diagnosis not present

## 2016-07-11 DIAGNOSIS — N186 End stage renal disease: Secondary | ICD-10-CM | POA: Diagnosis not present

## 2016-07-14 ENCOUNTER — Encounter (HOSPITAL_COMMUNITY): Payer: Self-pay | Admitting: *Deleted

## 2016-07-14 ENCOUNTER — Other Ambulatory Visit: Payer: Self-pay

## 2016-07-14 DIAGNOSIS — Z23 Encounter for immunization: Secondary | ICD-10-CM | POA: Diagnosis not present

## 2016-07-14 DIAGNOSIS — N186 End stage renal disease: Secondary | ICD-10-CM | POA: Diagnosis not present

## 2016-07-14 DIAGNOSIS — N151 Renal and perinephric abscess: Secondary | ICD-10-CM | POA: Diagnosis not present

## 2016-07-14 NOTE — Progress Notes (Addendum)
Anesthesia chart review:  Patient is a same day work up  Patient is a 61 year old female scheduled for insertion of left arm AV Gore-Tex graft on 07/15/2016 with Adele Barthel, M.D.  PMH includes: HTN, pre-diabetes, ESRD (on hemodialysis), gastric ulcer with hemorrhage, post-op N/V, GERD. Former smoker. BMI 27.5  - Hospitalized 3/7-07/03/16 for syncope, uncontrolled HTN (baseline is reportedly 220/110). Syncope suspected to be related to 100 point drop in systolic BP after taking her BP meds after dialysis; pt instructed to discuss timing of BP meds in relation to dialysis with nephrologist.   - Hospitalized 2/15-2/26/18 for acute on chronic kidney disease (hemodialysis initiated), pyelonephritis and L perinephric abscess, anemia of chronic disease (s/p 2 units PRBCs).  Medications include: Amlodipine, Pepcid, hydralazine, labetalol.  Labs from hospitalization 07/03/16 reviewed.   - H/H 8.4/27.1. This is consistent with prior results/baseline.   - Glucose 192.  - BUN/Cr consistent with ESRD  EKG 07/03/16: NSR. LVH. Non-specific ST-t changes. Prolonged QT (QTc 515).   - done in the setting of improving electrolyte abnormalities during last hospitalization. QTc was 584 at admission.  - Will repeat EKG DOS.   Echo 06/15/16:  - Left ventricle: The cavity size was normal. Systolic function was normal. The estimated ejection fraction was in the range of 55% to 60%. Wall motion was normal; there were no regional wall motion abnormalities. Doppler parameters are consistent with both elevated ventricular end-diastolic filling pressure and elevated left atrial filling pressure. - Left atrium: The atrium was mildly dilated. - Atrial septum: Likely PFO consider bubble study if clinically indicated.  If DOS labs and EKG acceptable, and BP controlled, I anticipate pt can proceed as scheduled.   Willeen Cass, FNP-BC [facility] Short Stay Surgical Center/Anesthesiology Phone: [phone] 07/14/2016 2:46 PM

## 2016-07-14 NOTE — Progress Notes (Signed)
Mrs Pallo denies chest pain or shortness of breath.  Patient reports that blood pressure have been steadier since the doctors have her take 1/ doses prior to dialysis

## 2016-07-15 ENCOUNTER — Ambulatory Visit (HOSPITAL_COMMUNITY): Payer: BLUE CROSS/BLUE SHIELD | Admitting: Certified Registered Nurse Anesthetist

## 2016-07-15 ENCOUNTER — Encounter (HOSPITAL_COMMUNITY)
Admission: RE | Disposition: A | Discharge status: Home / Self Care | Payer: Self-pay | Source: Ambulatory Visit | Attending: Vascular Surgery

## 2016-07-15 ENCOUNTER — Encounter (HOSPITAL_COMMUNITY): Payer: Self-pay | Admitting: Certified Registered Nurse Anesthetist

## 2016-07-15 ENCOUNTER — Ambulatory Visit (HOSPITAL_COMMUNITY)
Admission: RE | Admit: 2016-07-15 | Discharge: 2016-07-15 | Disposition: A | Discharge status: Home / Self Care | Payer: BLUE CROSS/BLUE SHIELD | Source: Ambulatory Visit | Attending: Vascular Surgery | Admitting: Vascular Surgery

## 2016-07-15 DIAGNOSIS — N185 Chronic kidney disease, stage 5: Secondary | ICD-10-CM | POA: Diagnosis not present

## 2016-07-15 DIAGNOSIS — Z9102 Food additives allergy status: Secondary | ICD-10-CM | POA: Insufficient documentation

## 2016-07-15 DIAGNOSIS — Z992 Dependence on renal dialysis: Secondary | ICD-10-CM | POA: Diagnosis not present

## 2016-07-15 DIAGNOSIS — K219 Gastro-esophageal reflux disease without esophagitis: Secondary | ICD-10-CM | POA: Insufficient documentation

## 2016-07-15 DIAGNOSIS — R7303 Prediabetes: Secondary | ICD-10-CM | POA: Diagnosis not present

## 2016-07-15 DIAGNOSIS — I4581 Long QT syndrome: Secondary | ICD-10-CM | POA: Insufficient documentation

## 2016-07-15 DIAGNOSIS — Z888 Allergy status to other drugs, medicaments and biological substances status: Secondary | ICD-10-CM | POA: Insufficient documentation

## 2016-07-15 DIAGNOSIS — N186 End stage renal disease: Secondary | ICD-10-CM | POA: Insufficient documentation

## 2016-07-15 DIAGNOSIS — Z87891 Personal history of nicotine dependence: Secondary | ICD-10-CM | POA: Diagnosis not present

## 2016-07-15 DIAGNOSIS — D631 Anemia in chronic kidney disease: Secondary | ICD-10-CM | POA: Insufficient documentation

## 2016-07-15 DIAGNOSIS — I120 Hypertensive chronic kidney disease with stage 5 chronic kidney disease or end stage renal disease: Secondary | ICD-10-CM | POA: Insufficient documentation

## 2016-07-15 DIAGNOSIS — Z880 Allergy status to penicillin: Secondary | ICD-10-CM | POA: Diagnosis not present

## 2016-07-15 DIAGNOSIS — Z8249 Family history of ischemic heart disease and other diseases of the circulatory system: Secondary | ICD-10-CM | POA: Diagnosis not present

## 2016-07-15 DIAGNOSIS — Z8719 Personal history of other diseases of the digestive system: Secondary | ICD-10-CM | POA: Insufficient documentation

## 2016-07-15 HISTORY — PX: BASCILIC VEIN TRANSPOSITION: SHX5742

## 2016-07-15 HISTORY — DX: Other complications of anesthesia, initial encounter: T88.59XA

## 2016-07-15 HISTORY — DX: End stage renal disease: N18.6

## 2016-07-15 HISTORY — DX: Diarrhea, unspecified: R19.7

## 2016-07-15 HISTORY — DX: Nausea with vomiting, unspecified: R11.2

## 2016-07-15 HISTORY — DX: Other specified postprocedural states: Z98.890

## 2016-07-15 HISTORY — DX: Adverse effect of unspecified anesthetic, initial encounter: T41.45XA

## 2016-07-15 SURGERY — TRANSPOSITION, VEIN, BASILIC
Anesthesia: Monitor Anesthesia Care | Site: Arm Upper | Laterality: Left

## 2016-07-15 MED ORDER — LIDOCAINE HCL (PF) 1 % IJ SOLN
INTRAMUSCULAR | Status: DC | PRN
Start: 2016-07-15 — End: 2016-07-15
  Administered 2016-07-15: 30 mL

## 2016-07-15 MED ORDER — CHLORHEXIDINE GLUCONATE CLOTH 2 % EX PADS: 6.0000 | MEDICATED_PAD | Freq: Once | CUTANEOUS | Status: DC

## 2016-07-15 MED ORDER — VANCOMYCIN HCL IN DEXTROSE 1-5 GM/200ML-% IV SOLN
1000.0000 mg | INTRAVENOUS | Status: AC
  Administered 2016-07-15: 1000 mg via INTRAVENOUS

## 2016-07-15 MED ORDER — PAPAVERINE HCL 30 MG/ML IJ SOLN
INTRAMUSCULAR | Status: AC
  Filled 2016-07-15: qty 2

## 2016-07-15 MED ORDER — SODIUM CHLORIDE 0.9 % IV SOLN
INTRAVENOUS | Status: DC | PRN
  Administered 2016-07-15: 07:00:00

## 2016-07-15 MED ORDER — FENTANYL CITRATE (PF) 100 MCG/2ML IJ SOLN: 25.0000 ug | INTRAMUSCULAR | Status: DC | PRN

## 2016-07-15 MED ORDER — MIDAZOLAM HCL 5 MG/5ML IJ SOLN
INTRAMUSCULAR | Status: DC | PRN
  Administered 2016-07-15 (×2): 1 mg via INTRAVENOUS

## 2016-07-15 MED ORDER — FENTANYL CITRATE (PF) 100 MCG/2ML IJ SOLN
INTRAMUSCULAR | Status: DC | PRN
  Administered 2016-07-15 (×4): 25 ug via INTRAVENOUS

## 2016-07-15 MED ORDER — 0.9 % SODIUM CHLORIDE (POUR BTL) OPTIME
TOPICAL | Status: DC | PRN
  Administered 2016-07-15: 1000 mL

## 2016-07-15 MED ORDER — PAPAVERINE HCL 30 MG/ML IJ SOLN
INTRAMUSCULAR | Status: DC | PRN
  Administered 2016-07-15: 2 mL

## 2016-07-15 MED ORDER — OXYCODONE-ACETAMINOPHEN 5-325 MG PO TABS: 1.0000 | ORAL_TABLET | Freq: Four times a day (QID) | ORAL | 0 refills | Status: DC | PRN

## 2016-07-15 MED ORDER — PROPOFOL 500 MG/50ML IV EMUL
INTRAVENOUS | Status: DC | PRN
  Administered 2016-07-15: 50 ug/kg/min via INTRAVENOUS

## 2016-07-15 MED ORDER — PROPOFOL 10 MG/ML IV BOLUS
INTRAVENOUS | Status: DC | PRN
  Administered 2016-07-15: 30 mg via INTRAVENOUS

## 2016-07-15 MED ORDER — VANCOMYCIN HCL IN DEXTROSE 1-5 GM/200ML-% IV SOLN
INTRAVENOUS | Status: AC
  Filled 2016-07-15: qty 200

## 2016-07-15 MED ORDER — FENTANYL CITRATE (PF) 100 MCG/2ML IJ SOLN
INTRAMUSCULAR | Status: AC
  Filled 2016-07-15: qty 2

## 2016-07-15 MED ORDER — MIDAZOLAM HCL 2 MG/2ML IJ SOLN
INTRAMUSCULAR | Status: AC
  Filled 2016-07-15: qty 2

## 2016-07-15 MED ORDER — PROPOFOL 10 MG/ML IV BOLUS
INTRAVENOUS | Status: AC
  Filled 2016-07-15: qty 20

## 2016-07-15 MED ORDER — LIDOCAINE HCL (PF) 1 % IJ SOLN
INTRAMUSCULAR | Status: AC
  Filled 2016-07-15: qty 30

## 2016-07-15 MED ORDER — SODIUM CHLORIDE 0.9 % IV SOLN
INTRAVENOUS | Status: DC
  Administered 2016-07-15: 07:00:00 via INTRAVENOUS

## 2016-07-15 SURGICAL SUPPLY — 33 items
ARMBAND PINK RESTRICT EXTREMIT (MISCELLANEOUS) ×3 IMPLANT
CANISTER SUCT 3000ML PPV (MISCELLANEOUS) ×3 IMPLANT
CLIP TI MEDIUM 6 (CLIP) ×3 IMPLANT
CLIP TI WIDE RED SMALL 6 (CLIP) ×3 IMPLANT
COVER PROBE W GEL 5X96 (DRAPES) ×3 IMPLANT
DECANTER SPIKE VIAL GLASS SM (MISCELLANEOUS) ×3 IMPLANT
DERMABOND ADVANCED (GAUZE/BANDAGES/DRESSINGS) ×1
DERMABOND ADVANCED .7 DNX12 (GAUZE/BANDAGES/DRESSINGS) ×2 IMPLANT
ELECT REM PT RETURN 9FT ADLT (ELECTROSURGICAL) ×3
ELECTRODE REM PT RTRN 9FT ADLT (ELECTROSURGICAL) ×2 IMPLANT
GLOVE BIO SURGEON STRL SZ 6.5 (GLOVE) ×3 IMPLANT
GLOVE BIO SURGEON STRL SZ7 (GLOVE) ×9 IMPLANT
GLOVE BIOGEL PI IND STRL 6.5 (GLOVE) ×2 IMPLANT
GLOVE BIOGEL PI IND STRL 7.0 (GLOVE) ×8 IMPLANT
GLOVE BIOGEL PI IND STRL 7.5 (GLOVE) ×2 IMPLANT
GLOVE BIOGEL PI INDICATOR 6.5 (GLOVE) ×1
GLOVE BIOGEL PI INDICATOR 7.0 (GLOVE) ×4
GLOVE BIOGEL PI INDICATOR 7.5 (GLOVE) ×1
GOWN STRL REUS W/ TWL LRG LVL3 (GOWN DISPOSABLE) ×10 IMPLANT
GOWN STRL REUS W/TWL LRG LVL3 (GOWN DISPOSABLE) ×5
KIT BASIN OR (CUSTOM PROCEDURE TRAY) ×3 IMPLANT
KIT ROOM TURNOVER OR (KITS) ×3 IMPLANT
NS IRRIG 1000ML POUR BTL (IV SOLUTION) ×3 IMPLANT
PACK CV ACCESS (CUSTOM PROCEDURE TRAY) ×3 IMPLANT
PAD ARMBOARD 7.5X6 YLW CONV (MISCELLANEOUS) ×6 IMPLANT
SUT MNCRL AB 4-0 PS2 18 (SUTURE) ×3 IMPLANT
SUT PROLENE 6 0 BV (SUTURE) ×3 IMPLANT
SUT PROLENE 7 0 BV 1 (SUTURE) ×6 IMPLANT
SUT VIC AB 3-0 SH 27 (SUTURE) ×2
SUT VIC AB 3-0 SH 27X BRD (SUTURE) ×4 IMPLANT
SYRINGE 20CC LL (MISCELLANEOUS) ×3 IMPLANT
UNDERPAD 30X30 (UNDERPADS AND DIAPERS) ×3 IMPLANT
WATER STERILE IRR 1000ML POUR (IV SOLUTION) ×3 IMPLANT

## 2016-07-15 NOTE — Anesthesia Preprocedure Evaluation (Signed)
Anesthesia Evaluation  Patient identified by MRN, date of birth, ID band Patient awake    Reviewed: Allergy & Precautions, NPO status , Patient's Chart, lab work & pertinent test results  History of Anesthesia Complications (+) PONV  Airway Mallampati: II  TM Distance: >3 FB     Dental   Pulmonary former smoker,    breath sounds clear to auscultation       Cardiovascular hypertension,  Rhythm:Regular Rate:Normal     Neuro/Psych    GI/Hepatic PUD, GERD  ,  Endo/Other    Renal/GU      Musculoskeletal   Abdominal   Peds  Hematology  (+) anemia ,   Anesthesia Other Findings   Reproductive/Obstetrics                             Anesthesia Physical Anesthesia Plan  ASA: III  Anesthesia Plan: MAC   Post-op Pain Management:    Induction: Intravenous  Airway Management Planned: Simple Face Mask  Additional Equipment:   Intra-op Plan:   Post-operative Plan:   Informed Consent:   Plan Discussed with: CRNA and Anesthesiologist  Anesthesia Plan Comments:         Anesthesia Quick Evaluation

## 2016-07-15 NOTE — H&P (View-Only) (Signed)
[facility] [place]  Patient name: Alison Baker MRN: [id number] DOB: [birth year] Sex: female  REASON FOR CONSULT: permanent dialysis access, consult is from Dr. Justin Mend  HPI: Alison Baker is a 61 y.o. female with new ESRD who is in need of permanent dialysis access. She was recently seen by Dr. Donnetta Hutching on 05/13/16 for access placement. She is right handed. At that time, she was not yet on hemodialysis. She presented to the [facility] ED on 06/13/16 with 3 week history of diarrhea, nausea and vomiting. She also had the flu three weeks prior admission.    Past Medical History:  Diagnosis Date  . Anemia   . Chronic kidney disease   . Gastric ulcer with hemorrhage   . GERD (gastroesophageal reflux disease)   . Hypertension   . Pre-diabetes     Family History  Problem Relation Age of Onset  . Hypertension Mother     SOCIAL HISTORY: Social History   Social History  . Marital status: Married    Spouse name: N/A  . Number of children: N/A  . Years of education: N/A   Occupational History  . Not on file.   Social History Main Topics  . Smoking status: Former Smoker    Types: Cigars    Quit date: 06/27/1995  . Smokeless tobacco: Never Used  . Alcohol use No  . Drug use: No  . Sexual activity: Not on file   Other Topics Concern  . Not on file   Social History Narrative  . No narrative on file    Allergies  Allergen Reactions  . Strawberry Extract Swelling  . Nsaids Other (See Comments)    Has a healing stomach ulcer  . Amoxicillin Rash  . Clonidine Nausea And Vomiting  . Penicillins Rash    Current Facility-Administered Medications  Medication Dose Route Frequency Provider Last Rate Last Dose  . 0.9 %  sodium chloride infusion   Intravenous Once Dron Tanna Furry, MD      . acetaminophen (TYLENOL) tablet 650 mg  650 mg Oral Q6H PRN Reubin Milan, MD      . amLODipine (NORVASC) tablet 10 mg  10 mg Oral Daily Reubin Milan, MD   10  mg at 06/23/16 1104  . calcium carbonate (TUMS - dosed in mg elemental calcium) chewable tablet 200 mg of elemental calcium  1 tablet Oral QAC supper Reubin Milan, MD   200 mg of elemental calcium at 06/22/16 1734  . ceFAZolin (ANCEF) IVPB 2g/100 mL premix  2 g Intravenous Q M,W,F-1800 Kimberly B Hammons, RPH   2 g at 06/20/16 1034  . Darbepoetin Alfa (ARANESP) injection 150 mcg  150 mcg Intravenous Q Mon-HD Rexene Agent, MD   150 mcg at 06/16/16 0941  . diphenhydrAMINE (BENADRYL) 12.5 MG/5ML elixir 12.5 mg  12.5 mg Oral Q6H PRN Dron Tanna Furry, MD      . famotidine (PEPCID) tablet 20 mg  20 mg Oral QHS Dron Tanna Furry, MD   20 mg at 06/22/16 2148  . heparin injection 1,400 Units  20 Units/kg Dialysis PRN Donato Heinz, MD      . hydrALAZINE (APRESOLINE) injection 10 mg  10 mg Intravenous Q4H PRN Ejiroghene E Emokpae, MD      . hydrALAZINE (APRESOLINE) tablet 25 mg  25 mg Oral Q8H Dron Tanna Furry, MD   25 mg at 06/23/16 0516  . labetalol (NORMODYNE) tablet 400 mg  400 mg Oral BID Ejiroghene E  Emokpae, MD   400 mg at 06/23/16 1104  . promethazine (PHENERGAN) injection 12.5 mg  12.5 mg Intravenous Q6H PRN Oswald Hillock, MD   12.5 mg at 06/18/16 0326  . sodium chloride flush (NS) 0.9 % injection 10-40 mL  10-40 mL Intracatheter PRN Rexene Agent, MD   20 mL at 06/19/16 1046    REVIEW OF SYSTEMS:  [X]  denotes positive finding, [ ]  denotes negative finding Cardiac  Comments:  Chest pain or chest pressure:    Shortness of breath upon exertion:    Short of breath when lying flat:    Irregular heart rhythm:        Vascular    Pain in calf, thigh, or hip brought on by ambulation:    Pain in feet at night that wakes you up from your sleep:     Blood clot in your veins:    Leg swelling:         Pulmonary    Oxygen at home:    Productive cough:     Wheezing:         Neurologic    Sudden weakness in arms or legs:     Sudden numbness in arms or legs:     Sudden onset  of difficulty speaking or slurred speech:    Temporary loss of vision in one eye:     Problems with dizziness:         Gastrointestinal    Blood in stool:     Vomited blood:         Genitourinary    Burning when urinating:     Blood in urine:        Psychiatric    Major depression:         Hematologic    Bleeding problems:    Problems with blood clotting too easily:        Skin    Rashes or ulcers:        Constitutional    Fever or chills:      PHYSICAL EXAM: Vitals:   06/22/16 2019 06/22/16 2100 06/23/16 0446 06/23/16 1000  BP: (!) 142/63  (!) 151/60 (!) 148/59  Pulse: 67  70 66  Resp: 17  16 18   Temp: 98.6 F (37 C)  98.9 F (37.2 C) 98.3 F (36.8 C)  TempSrc:    Oral  SpO2: 99%  97% 97%  Weight:  161 lb (73 kg)    Height:        GENERAL: The patient is a well-nourished female, in no acute distress. The vital signs are documented above. CARDIAC: There is a regular rate and rhythm. No carotid bruits.  VASCULAR: 2+ radial and brachial pulses bilaterally.  PULMONARY: There is good air exchange bilaterally without wheezing or rales. MUSCULOSKELETAL: There are no major deformities or cyanosis. NEUROLOGIC: No focal weakness or paresthesias are detected. SKIN: There are no ulcers or rashes noted. PSYCHIATRIC: The patient has a normal affect.  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   MEDICAL ISSUES: New ESRD  For [facility] placement by IR today. Will likely need graft placement given small vein conduits, but will make determination intra-op. Patient currently undergoing treatment for pyelonephritis. Would like to wait until her infection is clear prior to graft placement but will discuss with Dr. Donnetta Hutching.    Virgina Jock, PA-C [facility] [place]  I have examined the patient, reviewed and agree with above.Patient known  to me from prior office visit. Had a stable chronic renal insufficiency at that  time. Now had acute illness and is on hemodialysis. Also with perinephric abscess on the long-term antibiotic treatment. Discussed with patient need for left arm graft. Would defer this for 2-3 weeks to assure that she continues to remain afebrile. Will ask our office to schedule this on a nondialysis day and 2-3 week time frame  Curt Jews, MD 06/23/2016 4:40 PM

## 2016-07-15 NOTE — Interval H&P Note (Signed)
History and Physical Interval Note:  07/15/2016 7:17 AM  Alison Baker  has presented today for surgery, with the diagnosis of Chronic kidney disease  The various methods of treatment have been discussed with the patient and family. After consideration of risks, benefits and other options for treatment, the patient has consented to  Procedure(s): INSERTION OF ARTERIOVENOUS (AV) GORE-TEX GRAFT ARM (Left) as a surgical intervention .  The patient's history has been reviewed, patient examined, no change in status, stable for surgery.  I have reviewed the patient's chart and labs.  Questions were answered to the patient's satisfaction.     Adele Barthel

## 2016-07-15 NOTE — Transfer of Care (Signed)
Immediate Anesthesia Transfer of Care Note  Patient: Alison Baker  Procedure(s) Performed: Procedure(s): BRACHIAL VEIN TRANSPOSITION FIRST STAGE (Left)  Patient Location: PACU  Anesthesia Type:MAC  Level of Consciousness: awake, alert , oriented and patient cooperative  Airway & Oxygen Therapy: Patient Spontanous Breathing and Patient connected to face mask oxygen  Post-op Assessment: Report given to RN and Post -op Vital signs reviewed and stable  Post vital signs: Reviewed and stable  Last Vitals:  Vitals:   07/15/16 0640  BP: (!) 155/67  Pulse: 70  Resp: 18  Temp: 37.2 C    Last Pain: There were no vitals filed for this visit.    Patients Stated Pain Goal: 3 (08/81/10 3159)  Complications: No apparent anesthesia complications

## 2016-07-15 NOTE — Op Note (Signed)
OPERATIVE NOTE   PROCEDURE: 1. Left first stage brachial vein transposition (brachiobrachial arteriovenous fistula) placement  PRE-OPERATIVE DIAGNOSIS: end stage renal disease  POST-OPERATIVE DIAGNOSIS: same as above   SURGEON: Adele Barthel, MD  ASSISTANT(S): Gerri Lins, PAC   ANESTHESIA: local and MAC  ESTIMATED BLOOD LOSS: 30 cc  FINDING(S): 1. Palpable thrill at end of case 2. Dopplerable radial signal at end of case 3. Possible high bifurcation  SPECIMEN(S):  none  INDICATIONS:   Alison Baker is a 61 y.o. female who presents with end stage renal disease.  The patient is scheduled for left arm arteriovenous graft placement.  I discussed prior to proceeding that I would consider a transposition in the event her veins were adequate.  The patient is aware the risks include but are not limited to: bleeding, infection, steal syndrome, nerve damage, ischemic monomelic neuropathy, failure to mature, and need for additional procedures.  The patient is aware of the risks of the procedure and elects to proceed forward.   DESCRIPTION: After full informed written consent was obtained from the patient, the patient was brought back to the operating room and placed supine upon the operating table.  Prior to induction, the patient received IV antibiotics.   After obtaining adequate anesthesia, the patient was then prepped and draped in the standard fashion for a left arm access procedure.  I turned my attention first to identifying the patient's brachial vein and brachial artery.  Using SonoSite guidance, the location of these vessels were marked out on the skin.   I felt the brachial vein lateral to the brachial artery was adequate for attempt at a staged brachial vein transposition.  At this point, I injected local anesthetic to obtain a field block of the antecubitum.  In total, I injected about 10 mL of 1% lidocaine without epinephrine.  I made a longitudinal incision at the level of  the antecubitum and dissected through the subcutaneous tissue and fascia to gain exposure of the brachial artery.  This was noted to be 3-3.5 mm in diameter externally.  This was dissected out proximally and distally and controlled with vessel loops .  I then dissected out the brachial vein.  This was noted to be 3 mm in diameter externally.  The distal segment of the vein was ligated with a  2-0 silk, and the vein was transected.  The proximal segment was interrogated with serial dilators.  The vein accepted up to a 3.5 mm dilator without any difficulty.  I then instilled the heparinized saline into the vein and clamped it.  At this point, I reset my exposure of the brachial artery and placed the artery under tension proximally and distally.  I made an arteriotomy with a #11 blade, and then I extended the arteriotomy with a Potts scissor.  I injected heparinized saline proximal and distal to this arteriotomy.  The vein was then sewn to the artery in an end-to-side configuration with a running stitch of 7-0 Prolene.  Prior to completing this anastomosis, I allowed the vein and artery to backbleed.  There was no evidence of clot from any vessels.  I completed the anastomosis in the usual fashion and then released all vessel loops and clamps.  There was a palpable  thrill in the venous outflow, and there was a dopplerable radial signal.    At this point, I irrigated out the surgical wound.  There was no further active bleeding.  The subcutaneous tissue was reapproximated with a running stitch  of 3-0 Vicryl.  The skin was then reapproximated with a running subcuticular stitch of 4-0 Vicryl.  The skin was then cleaned, dried, and reinforced with Liquidband.  The patient tolerated this procedure well.    COMPLICATIONS: none  CONDITION: stable   Adele Barthel, MD, [facility] [place] Office: [phone] Pager: [phone]  07/15/2016, 8:38 AM

## 2016-07-15 NOTE — Anesthesia Postprocedure Evaluation (Signed)
Anesthesia Post Note  Patient: Alison Baker  Procedure(s) Performed: Procedure(s) (LRB): BRACHIAL VEIN TRANSPOSITION FIRST STAGE (Left)  Patient location during evaluation: PACU Anesthesia Type: MAC Level of consciousness: awake Pain management: pain level controlled Vital Signs Assessment: post-procedure vital signs reviewed and stable Respiratory status: spontaneous breathing Cardiovascular status: stable Anesthetic complications: no       Last Vitals:  Vitals:   07/15/16 0911 07/15/16 0918  BP:  136/68  Pulse: 73 66  Resp: 20 18  Temp: 36.3 C     Last Pain: There were no vitals filed for this visit.               Mikenzie Mccannon

## 2016-07-16 ENCOUNTER — Telehealth: Payer: Self-pay | Admitting: Vascular Surgery

## 2016-07-16 ENCOUNTER — Encounter (HOSPITAL_COMMUNITY): Payer: Self-pay | Admitting: Vascular Surgery

## 2016-07-16 DIAGNOSIS — N151 Renal and perinephric abscess: Secondary | ICD-10-CM | POA: Diagnosis not present

## 2016-07-16 DIAGNOSIS — N186 End stage renal disease: Secondary | ICD-10-CM | POA: Diagnosis not present

## 2016-07-16 DIAGNOSIS — Z23 Encounter for immunization: Secondary | ICD-10-CM | POA: Diagnosis not present

## 2016-07-16 LAB — POCT I-STAT 4, (NA,K, GLUC, HGB,HCT)
GLUCOSE: 124 mg/dL — AB (ref 65–99)
HCT: 25 % — ABNORMAL LOW (ref 36.0–46.0)
Hemoglobin: 8.5 g/dL — ABNORMAL LOW (ref 12.0–15.0)
POTASSIUM: 3.4 mmol/L — AB (ref 3.5–5.1)
SODIUM: 138 mmol/L (ref 135–145)

## 2016-07-16 NOTE — Telephone Encounter (Signed)
-----   Message from Denman George, RN sent at 07/15/2016 12:47 PM EDT ----- Regarding: needs 6 week f/u with Dr. Bridgett Larsson   ----- Message ----- From: Conrad [place], MD Sent: 07/15/2016   8:41 AM To: Vvs Charge Pool  Alison Baker [id number] 05-03-1955  PROCEDURE: 1. Left first stage brachial vein transposition (brachiobrachial arteriovenous fistula) placement  Asst: Gerri Lins, PAC  Follow-up: 6 weeks

## 2016-07-16 NOTE — Telephone Encounter (Signed)
Sched appt 09/05/16 at 11:45. Lm on hm# to inform pt of appt.

## 2016-07-18 DIAGNOSIS — Z23 Encounter for immunization: Secondary | ICD-10-CM | POA: Diagnosis not present

## 2016-07-18 DIAGNOSIS — N151 Renal and perinephric abscess: Secondary | ICD-10-CM | POA: Diagnosis not present

## 2016-07-18 DIAGNOSIS — N186 End stage renal disease: Secondary | ICD-10-CM | POA: Diagnosis not present

## 2016-07-21 DIAGNOSIS — N186 End stage renal disease: Secondary | ICD-10-CM | POA: Diagnosis not present

## 2016-07-23 DIAGNOSIS — N186 End stage renal disease: Secondary | ICD-10-CM | POA: Diagnosis not present

## 2016-07-25 DIAGNOSIS — N186 End stage renal disease: Secondary | ICD-10-CM | POA: Diagnosis not present

## 2016-07-26 DIAGNOSIS — N186 End stage renal disease: Secondary | ICD-10-CM | POA: Diagnosis not present

## 2016-07-26 DIAGNOSIS — Z992 Dependence on renal dialysis: Secondary | ICD-10-CM | POA: Diagnosis not present

## 2016-07-26 DIAGNOSIS — I129 Hypertensive chronic kidney disease with stage 1 through stage 4 chronic kidney disease, or unspecified chronic kidney disease: Secondary | ICD-10-CM | POA: Diagnosis not present

## 2016-07-28 DIAGNOSIS — N186 End stage renal disease: Secondary | ICD-10-CM | POA: Diagnosis not present

## 2016-07-30 DIAGNOSIS — N186 End stage renal disease: Secondary | ICD-10-CM | POA: Diagnosis not present

## 2016-08-01 DIAGNOSIS — N186 End stage renal disease: Secondary | ICD-10-CM | POA: Diagnosis not present

## 2016-08-04 DIAGNOSIS — N186 End stage renal disease: Secondary | ICD-10-CM | POA: Diagnosis not present

## 2016-08-06 DIAGNOSIS — N186 End stage renal disease: Secondary | ICD-10-CM | POA: Diagnosis not present

## 2016-08-07 DIAGNOSIS — H4313 Vitreous hemorrhage, bilateral: Secondary | ICD-10-CM | POA: Diagnosis not present

## 2016-08-08 DIAGNOSIS — N186 End stage renal disease: Secondary | ICD-10-CM | POA: Diagnosis not present

## 2016-08-11 DIAGNOSIS — N186 End stage renal disease: Secondary | ICD-10-CM | POA: Diagnosis not present

## 2016-08-12 DIAGNOSIS — N185 Chronic kidney disease, stage 5: Secondary | ICD-10-CM | POA: Diagnosis not present

## 2016-08-12 DIAGNOSIS — E785 Hyperlipidemia, unspecified: Secondary | ICD-10-CM | POA: Diagnosis not present

## 2016-08-12 DIAGNOSIS — R7303 Prediabetes: Secondary | ICD-10-CM | POA: Diagnosis not present

## 2016-08-12 DIAGNOSIS — I132 Hypertensive heart and chronic kidney disease with heart failure and with stage 5 chronic kidney disease, or end stage renal disease: Secondary | ICD-10-CM | POA: Diagnosis not present

## 2016-08-13 DIAGNOSIS — N186 End stage renal disease: Secondary | ICD-10-CM | POA: Diagnosis not present

## 2016-08-15 DIAGNOSIS — N186 End stage renal disease: Secondary | ICD-10-CM | POA: Diagnosis not present

## 2016-08-18 DIAGNOSIS — R52 Pain, unspecified: Secondary | ICD-10-CM | POA: Diagnosis not present

## 2016-08-18 DIAGNOSIS — Z23 Encounter for immunization: Secondary | ICD-10-CM | POA: Diagnosis not present

## 2016-08-18 DIAGNOSIS — N186 End stage renal disease: Secondary | ICD-10-CM | POA: Diagnosis not present

## 2016-08-19 DIAGNOSIS — H4311 Vitreous hemorrhage, right eye: Secondary | ICD-10-CM | POA: Diagnosis not present

## 2016-08-20 DIAGNOSIS — Z23 Encounter for immunization: Secondary | ICD-10-CM | POA: Diagnosis not present

## 2016-08-20 DIAGNOSIS — N186 End stage renal disease: Secondary | ICD-10-CM | POA: Diagnosis not present

## 2016-08-20 DIAGNOSIS — R52 Pain, unspecified: Secondary | ICD-10-CM | POA: Diagnosis not present

## 2016-08-22 DIAGNOSIS — Z23 Encounter for immunization: Secondary | ICD-10-CM | POA: Diagnosis not present

## 2016-08-22 DIAGNOSIS — N186 End stage renal disease: Secondary | ICD-10-CM | POA: Diagnosis not present

## 2016-08-22 DIAGNOSIS — R52 Pain, unspecified: Secondary | ICD-10-CM | POA: Diagnosis not present

## 2016-08-25 DIAGNOSIS — Z992 Dependence on renal dialysis: Secondary | ICD-10-CM | POA: Diagnosis not present

## 2016-08-25 DIAGNOSIS — I129 Hypertensive chronic kidney disease with stage 1 through stage 4 chronic kidney disease, or unspecified chronic kidney disease: Secondary | ICD-10-CM | POA: Diagnosis not present

## 2016-08-25 DIAGNOSIS — N186 End stage renal disease: Secondary | ICD-10-CM | POA: Diagnosis not present

## 2016-08-25 DIAGNOSIS — R197 Diarrhea, unspecified: Secondary | ICD-10-CM | POA: Diagnosis not present

## 2016-08-26 HISTORY — PX: EYE SURGERY: SHX253

## 2016-08-27 DIAGNOSIS — N186 End stage renal disease: Secondary | ICD-10-CM | POA: Diagnosis not present

## 2016-08-28 ENCOUNTER — Encounter: Payer: Self-pay | Admitting: Vascular Surgery

## 2016-08-29 DIAGNOSIS — N186 End stage renal disease: Secondary | ICD-10-CM | POA: Diagnosis not present

## 2016-09-01 DIAGNOSIS — R52 Pain, unspecified: Secondary | ICD-10-CM | POA: Diagnosis not present

## 2016-09-01 DIAGNOSIS — N186 End stage renal disease: Secondary | ICD-10-CM | POA: Diagnosis not present

## 2016-09-01 NOTE — Progress Notes (Signed)
Postoperative Access Visit   History of Present Illness   Alison Baker is a 61 y.o. year old female who presents for postoperative follow-up for: left first stage brachial vein transposition (Date: 07/15/16).  The patient's wounds are healed.  The patient notes no steal symptoms.  The patient is able to complete their activities of daily living.  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  The patient is undergoing some surgeries for her retinal hemorrhage.  Past Medical History:  Diagnosis Date  . Anemia   . Complication of anesthesia    slow to awaken, sentive to medications rarely even takes a Tylenol.  . Diarrhea   . End-stage kidney disease ([place])   . Gastric ulcer with hemorrhage   . GERD (gastroesophageal reflux disease)   . Hypertension   . PONV (postoperative nausea and vomiting)    after hysterectomy- 1980's  . Pre-diabetes     Past Surgical History:  Procedure Laterality Date  . ABDOMINAL HYSTERECTOMY    . BASCILIC VEIN TRANSPOSITION Left 07/15/2016   Procedure: BRACHIAL VEIN TRANSPOSITION FIRST STAGE;  Surgeon: Conrad [place], MD;  Location: [facility];  Service: Vascular;  Laterality: Left;  . CHOLECYSTECTOMY OPEN    . COLONOSCOPY W/ POLYPECTOMY    . EYE SURGERY Right    catarct  . IR GENERIC HISTORICAL  06/23/2016   IR US GUIDE VASC ACCESS RIGHT 06/23/2016 Arne Cleveland, MD [facility]  . IR GENERIC HISTORICAL  06/23/2016   IR FLUORO GUIDE CV LINE RIGHT 06/23/2016 Arne Cleveland, MD [facility]  . TOTAL ABDOMINAL HYSTERECTOMY W/ BILATERAL SALPINGOOPHORECTOMY    . TRABECULECTOMY Bilateral     Social History   Social History  . Marital status: Married    Spouse name: N/A  . Number of children: N/A  . Years of education: N/A   Occupational History  . Not on file.   Social History Main Topics  . Smoking status: Former Smoker    Types: Cigars    Quit date: 06/27/1995  . Smokeless tobacco: Never Used  . Alcohol use No  . Drug use: No  . Sexual  activity: Not on file   Other Topics Concern  . Not on file   Social History Narrative  . No narrative on file    Family History  Problem Relation Age of Onset  . Hypertension Mother     Current Outpatient Prescriptions  Medication Sig Dispense Refill  . amLODipine (NORVASC) 10 MG tablet Take 10 mg by mouth daily.     . calcium carbonate (TUMS - DOSED IN MG ELEMENTAL CALCIUM) 500 MG chewable tablet Chew 1 tablet by mouth 3 (three) times daily with meals.    . famotidine (PEPCID) 20 MG tablet Take 1 tablet (20 mg total) by mouth at bedtime. 30 tablet 0  . hydrALAZINE (APRESOLINE) 25 MG tablet Take 1 tablet (25 mg total) by mouth every 8 (eight) hours. (Patient taking differently: Take 25 mg by mouth every 8 (eight) hours. On dialysis days MWF, takes 25mg  in the AM and PM only.) 90 tablet 0  . labetalol (NORMODYNE) 300 MG tablet Take 1 tablet (300 mg total) by mouth 2 (two) times daily. (Patient taking differently: Take 300 mg by mouth 2 (two) times daily. On dialysis days MWF, takes 150mg  in the AM and 300mg  in the PM.)    . oxyCODONE-acetaminophen (PERCOCET/ROXICET) 5-325 MG tablet Take 1 tablet by mouth every 6 (six) hours as needed. (Patient not taking: Reported on 09/05/2016) 6 tablet  0   No current facility-administered medications for this visit.      Allergies  Allergen Reactions  . Strawberry Extract Swelling  . Nsaids Other (See Comments)    Has a healing stomach ulcer  . Clonidine Nausea And Vomiting  . Penicillins Rash and Other (See Comments)    Rash and rib pain. Has patient had a PCN reaction causing immediate rash, facial/tongue/throat swelling, SOB or lightheadedness with hypotension: No Has patient had a PCN reaction causing severe rash involving mucus membranes or skin necrosis: No Has patient had a PCN reaction that required hospitalization: No Has patient had a PCN reaction occurring within the last 10 years: No If all of the above answers are "NO", then may  proceed with Cephalosporin use.   . Sulfa Antibiotics Rash and Other (See Comments)    Rib pain     REVIEW OF SYSTEMS:  (Positives checked otherwise negative)  CARDIOVASCULAR:   [ ]  chest pain,  [ ]  chest pressure,  [ ]  palpitations,  [ ]  shortness of breath when laying flat,  [ ]  shortness of breath with exertion,   [ ]  pain in feet when walking,  [ ]  pain in feet when laying flat, [ ]  history of blood clot in veins (DVT),  [ ]  history of phlebitis,  [ ]  swelling in legs,  [ ]  varicose veins  PULMONARY:   [ ]  productive cough,  [ ]  asthma,  [ ]  wheezing  NEUROLOGIC:   [ ]  weakness in arms or legs,  [ ]  numbness in arms or legs,  [ ]  difficulty speaking or slurred speech,  [x]  loss of vision in one eye,  [ ]  dizziness  HEMATOLOGIC:   [ ]  bleeding problems,  [ ]  problems with blood clotting too easily  MUSCULOSKEL:   [ ]  joint pain, [ ]  joint swelling  GASTROINTEST:   [ ]  vomiting blood,  [ ]  blood in stool     GENITOURINARY:   [ ]  burning with urination,  [ ]  blood in urine [x]  end stage renal disease-HD: M/W/F   PSYCHIATRIC:   [ ]  history of major depression  INTEGUMENTARY:   [ ]  rashes,  [ ]  ulcers  CONSTITUTIONAL:   [ ]  fever,  [ ]  chills    Physical Examination   Vitals:   09/05/16 0829 09/05/16 0830  BP: (!) 154/78 (!) 157/71  Pulse: 70   Resp: 18   Temp: 97.4 F (36.3 C)   TempSrc: Oral   SpO2: 100%   Weight: 164 lb 9.6 oz (74.7 kg)   Height: 5\' 3"  (1.6 m)     Pulmonary Sym exp, good air movt, CTAB, no rales, rhonchi, & wheezing  Cardiac RRR, Nl S1, S2, no Murmurs, rubs or gallops  LUE: Incision is healed, skin feels warm, hand grip is 5/5, sensation in digits is intact, palpable thrill, bruit can be auscultated, on Sonosite: fistula appears to be >6 mm throughout most of length   Medical Decision Making   Alison Baker is a 61 y.o. year old female who presents s/p left first stage brachial vein transposition   Pt's  fistula is ready for transposition.  Due to patient's eye surgeries, she wants to postpone to 5 JUN 18. Risk, benefits, and alternatives to access surgery were discussed.   The patient is aware the risks include but are not limited to: bleeding, infection, steal syndrome, nerve damage, ischemic monomelic neuropathy, thrombosis, failure to mature, need for additional procedures, death and  stroke.   The patient agrees to proceed forward with the procedure.  Thank you for allowing [place] to participate in this patient's care.   Adele Barthel, MD, FACS [facility] [place] Office: [phone] Pager: [phone]

## 2016-09-03 DIAGNOSIS — N186 End stage renal disease: Secondary | ICD-10-CM | POA: Diagnosis not present

## 2016-09-03 DIAGNOSIS — R52 Pain, unspecified: Secondary | ICD-10-CM | POA: Diagnosis not present

## 2016-09-05 ENCOUNTER — Other Ambulatory Visit: Payer: Self-pay

## 2016-09-05 ENCOUNTER — Encounter: Payer: BLUE CROSS/BLUE SHIELD | Admitting: Vascular Surgery

## 2016-09-05 ENCOUNTER — Encounter: Payer: Self-pay | Admitting: Vascular Surgery

## 2016-09-05 ENCOUNTER — Ambulatory Visit (INDEPENDENT_AMBULATORY_CARE_PROVIDER_SITE_OTHER): Payer: Self-pay | Admitting: Vascular Surgery

## 2016-09-05 VITALS — BP 157/71 | HR 70 | Temp 97.4°F | Resp 18 | Ht 63.0 in | Wt 164.6 lb

## 2016-09-05 DIAGNOSIS — Z992 Dependence on renal dialysis: Secondary | ICD-10-CM

## 2016-09-05 DIAGNOSIS — R52 Pain, unspecified: Secondary | ICD-10-CM | POA: Diagnosis not present

## 2016-09-05 DIAGNOSIS — N186 End stage renal disease: Secondary | ICD-10-CM

## 2016-09-08 DIAGNOSIS — N186 End stage renal disease: Secondary | ICD-10-CM | POA: Diagnosis not present

## 2016-09-08 DIAGNOSIS — R52 Pain, unspecified: Secondary | ICD-10-CM | POA: Diagnosis not present

## 2016-09-09 DIAGNOSIS — H2512 Age-related nuclear cataract, left eye: Secondary | ICD-10-CM | POA: Diagnosis not present

## 2016-09-10 DIAGNOSIS — R52 Pain, unspecified: Secondary | ICD-10-CM | POA: Diagnosis not present

## 2016-09-10 DIAGNOSIS — N186 End stage renal disease: Secondary | ICD-10-CM | POA: Diagnosis not present

## 2016-09-11 ENCOUNTER — Inpatient Hospital Stay (HOSPITAL_COMMUNITY)
Admission: EM | Admit: 2016-09-11 | Discharge: 2016-09-14 | DRG: 690 | Disposition: A | Discharge status: Home / Self Care | Payer: BLUE CROSS/BLUE SHIELD | Attending: Family Medicine | Admitting: Family Medicine

## 2016-09-11 ENCOUNTER — Encounter (HOSPITAL_COMMUNITY): Payer: Self-pay

## 2016-09-11 DIAGNOSIS — B9620 Unspecified Escherichia coli [E. coli] as the cause of diseases classified elsewhere: Secondary | ICD-10-CM | POA: Diagnosis present

## 2016-09-11 DIAGNOSIS — Z8249 Family history of ischemic heart disease and other diseases of the circulatory system: Secondary | ICD-10-CM | POA: Diagnosis not present

## 2016-09-11 DIAGNOSIS — Z882 Allergy status to sulfonamides status: Secondary | ICD-10-CM | POA: Diagnosis not present

## 2016-09-11 DIAGNOSIS — N1330 Unspecified hydronephrosis: Secondary | ICD-10-CM | POA: Diagnosis not present

## 2016-09-11 DIAGNOSIS — I10 Essential (primary) hypertension: Secondary | ICD-10-CM | POA: Diagnosis present

## 2016-09-11 DIAGNOSIS — D631 Anemia in chronic kidney disease: Secondary | ICD-10-CM | POA: Diagnosis not present

## 2016-09-11 DIAGNOSIS — Z880 Allergy status to penicillin: Secondary | ICD-10-CM

## 2016-09-11 DIAGNOSIS — Z87891 Personal history of nicotine dependence: Secondary | ICD-10-CM

## 2016-09-11 DIAGNOSIS — N136 Pyonephrosis: Secondary | ICD-10-CM | POA: Diagnosis not present

## 2016-09-11 DIAGNOSIS — R109 Unspecified abdominal pain: Secondary | ICD-10-CM | POA: Diagnosis not present

## 2016-09-11 DIAGNOSIS — E1122 Type 2 diabetes mellitus with diabetic chronic kidney disease: Secondary | ICD-10-CM | POA: Diagnosis present

## 2016-09-11 DIAGNOSIS — E1129 Type 2 diabetes mellitus with other diabetic kidney complication: Secondary | ICD-10-CM | POA: Diagnosis not present

## 2016-09-11 DIAGNOSIS — N2581 Secondary hyperparathyroidism of renal origin: Secondary | ICD-10-CM | POA: Diagnosis not present

## 2016-09-11 DIAGNOSIS — N151 Renal and perinephric abscess: Secondary | ICD-10-CM

## 2016-09-11 DIAGNOSIS — N186 End stage renal disease: Secondary | ICD-10-CM

## 2016-09-11 DIAGNOSIS — H2512 Age-related nuclear cataract, left eye: Secondary | ICD-10-CM | POA: Diagnosis not present

## 2016-09-11 DIAGNOSIS — Z8711 Personal history of peptic ulcer disease: Secondary | ICD-10-CM | POA: Diagnosis not present

## 2016-09-11 DIAGNOSIS — Z992 Dependence on renal dialysis: Secondary | ICD-10-CM

## 2016-09-11 DIAGNOSIS — H25812 Combined forms of age-related cataract, left eye: Secondary | ICD-10-CM | POA: Diagnosis not present

## 2016-09-11 DIAGNOSIS — I120 Hypertensive chronic kidney disease with stage 5 chronic kidney disease or end stage renal disease: Secondary | ICD-10-CM | POA: Diagnosis present

## 2016-09-11 DIAGNOSIS — N12 Tubulo-interstitial nephritis, not specified as acute or chronic: Secondary | ICD-10-CM | POA: Diagnosis not present

## 2016-09-11 DIAGNOSIS — Z79899 Other long term (current) drug therapy: Secondary | ICD-10-CM

## 2016-09-11 DIAGNOSIS — K219 Gastro-esophageal reflux disease without esophagitis: Secondary | ICD-10-CM | POA: Diagnosis present

## 2016-09-11 LAB — I-STAT CG4 LACTIC ACID, ED: Lactic Acid, Venous: 1.66 mmol/L (ref 0.5–1.9)

## 2016-09-11 MED ORDER — OXYCODONE-ACETAMINOPHEN 5-325 MG PO TABS
1.0000 | ORAL_TABLET | ORAL | Status: DC | PRN
  Administered 2016-09-11: 1 via ORAL

## 2016-09-11 MED ORDER — ONDANSETRON 4 MG PO TBDP
ORAL_TABLET | ORAL | Status: AC
  Administered 2016-09-11: 4 mg
  Filled 2016-09-11: qty 1

## 2016-09-11 MED ORDER — OXYCODONE-ACETAMINOPHEN 5-325 MG PO TABS
ORAL_TABLET | ORAL | Status: AC
  Filled 2016-09-11: qty 1

## 2016-09-11 NOTE — ED Provider Notes (Signed)
[facility] DEPT Provider Note   CSN: [id number] Arrival date & time: 09/11/16  2254  By signing my name below, I, Dolores Hoose, attest that this documentation has been prepared under the direction and in the presence of Delora Fuel, MD . Electronically Signed: Dolores Hoose, Scribe. 09/12/2016. 12:05 AM.  History   Chief Complaint Chief Complaint  Patient presents with  . Flank Pain  . Vascular Access Problem   The history is provided by the patient. No language interpreter was used.    HPI Comments:  Alison Baker is a 61 y.o. female with pmhx of ESRD on a MWF schedule who presents to the Emergency Department complaining of constant, severe left plank onset 3 hours PTA. Pt describes her pain as beginning in her left flank and radiating to her LLQ. Her pain is alleviated with mild massage. She reports associated nausea and vomiting x1. Pt was given a percocet in triage with minimal relief of symptoms. Denies any fever, chills or diaphoresis. She notes that she had cataract surgery this morning.  Pt secondarily complains of a vascular port problem. She notes that she still makes urine but complains of dysuria x1 week.    Past Medical History:  Diagnosis Date  . Anemia   . Complication of anesthesia    slow to awaken, sentive to medications rarely even takes a Tylenol.  . Diarrhea   . End-stage kidney disease ([place])   . Gastric ulcer with hemorrhage   . GERD (gastroesophageal reflux disease)   . Hypertension   . PONV (postoperative nausea and vomiting)    after hysterectomy- 1980's  . Pre-diabetes     Patient Active Problem List   Diagnosis Date Noted  . ESRD on dialysis ([place]) 07/03/2016  . Syncope 07/02/2016  . Perinephric abscess 06/15/2016  . Pyelonephritis   . Acute on chronic kidney failure ([place]) 06/13/2016  . Essential hypertension 06/13/2016  . Anemia of renal disease 06/13/2016  . GERD (gastroesophageal reflux disease) 06/13/2016  . Pre-diabetes 06/13/2016    . UTI (urinary tract infection) 06/13/2016  . Nausea and vomiting 06/13/2016    Past Surgical History:  Procedure Laterality Date  . ABDOMINAL HYSTERECTOMY    . BASCILIC VEIN TRANSPOSITION Left 07/15/2016   Procedure: BRACHIAL VEIN TRANSPOSITION FIRST STAGE;  Surgeon: Conrad [place], MD;  Location: [facility];  Service: Vascular;  Laterality: Left;  . CHOLECYSTECTOMY OPEN    . COLONOSCOPY W/ POLYPECTOMY    . EYE SURGERY Right    catarct  . IR GENERIC HISTORICAL  06/23/2016   IR US GUIDE VASC ACCESS RIGHT 06/23/2016 Arne Cleveland, MD [facility]  . IR GENERIC HISTORICAL  06/23/2016   IR FLUORO GUIDE CV LINE RIGHT 06/23/2016 Arne Cleveland, MD [facility]  . TOTAL ABDOMINAL HYSTERECTOMY W/ BILATERAL SALPINGOOPHORECTOMY    . TRABECULECTOMY Bilateral     OB History    No data available       Home Medications    Prior to Admission medications   Medication Sig Start Date End Date Taking? Authorizing Provider  amLODipine (NORVASC) 10 MG tablet Take 10 mg by mouth daily.  09/09/15 09/08/16  [provider]  calcium carbonate (TUMS - DOSED IN MG ELEMENTAL CALCIUM) 500 MG chewable tablet Chew 1 tablet by mouth 3 (three) times daily with meals.    [provider]  famotidine (PEPCID) 20 MG tablet Take 1 tablet (20 mg total) by mouth at bedtime. 06/23/16   Domenic Polite, MD  hydrALAZINE (APRESOLINE) 25 MG tablet Take  1 tablet (25 mg total) by mouth every 8 (eight) hours. Patient taking differently: Take 25 mg by mouth every 8 (eight) hours. On dialysis days MWF, takes 25mg  in the AM and PM only. 06/23/16   Domenic Polite, MD  labetalol (NORMODYNE) 300 MG tablet Take 1 tablet (300 mg total) by mouth 2 (two) times daily. Patient taking differently: Take 300 mg by mouth 2 (two) times daily. On dialysis days MWF, takes 150mg  in the AM and 300mg  in the PM. 06/23/16   Domenic Polite, MD  oxyCODONE-acetaminophen (PERCOCET/ROXICET) 5-325 MG tablet Take 1 tablet by mouth every 6 (six)  hours as needed. Patient not taking: Reported on 09/05/2016 07/15/16   Ulyses Amor, PA-C    Family History Family History  Problem Relation Age of Onset  . Hypertension Mother     Social History Social History  Substance Use Topics  . Smoking status: Former Smoker    Types: Cigars    Quit date: 06/27/1995  . Smokeless tobacco: Never Used  . Alcohol use No     Allergies   Strawberry extract; Nsaids; Clonidine; Penicillins; and Sulfa antibiotics   Review of Systems Review of Systems  Constitutional: Negative for chills, diaphoresis and fever.  Gastrointestinal: Positive for nausea and vomiting.  Genitourinary: Positive for dysuria and flank pain.  All other systems reviewed and are negative.    Physical Exam Updated Vital Signs BP (!) 172/92   Pulse 76   Temp 98.7 F (37.1 C) (Oral)   Resp 18   Ht 5' 3.5" (1.613 m)   Wt 162 lb (73.5 kg)   SpO2 100%   BMI 28.25 kg/m   Physical Exam  Constitutional: She is oriented to person, place, and time. She appears well-developed and well-nourished. She appears distressed.  Appears uncomfortable  HENT:  Head: Normocephalic and atraumatic.  Eyes: EOM are normal. Pupils are equal, round, and reactive to light.  Neck: Normal range of motion. Neck supple. No JVD present.  Cardiovascular: Normal rate, regular rhythm and normal heart sounds.   No murmur heard. Pulmonary/Chest: Effort normal and breath sounds normal. She has no wheezes. She has no rales. She exhibits no tenderness.  Hemodialysis access catheter present on right side.   Abdominal: Soft. She exhibits no distension and no mass. There is tenderness. There is no rebound and no guarding.  Moderate left lower quadrant tenderness. Severe left CVA tenderness. No rebound or guarding. Decreased bowel sounds.   Musculoskeletal: Normal range of motion. She exhibits no edema.  Lymphadenopathy:    She has no cervical adenopathy.  Neurological: She is alert and oriented to  person, place, and time. No cranial nerve deficit. She exhibits normal muscle tone. Coordination normal.  Skin: Skin is warm and dry. No rash noted.  Psychiatric: She has a normal mood and affect. Her behavior is normal. Judgment and thought content normal.  Nursing note and vitals reviewed.    ED Treatments / Results  DIAGNOSTIC STUDIES:  Oxygen Saturation is 100% on RA, normal by my interpretation.    COORDINATION OF CARE:  12:05 AM Discussed treatment plan with pt at bedside which includes CT imaging  and pt agreed to plan.  Labs (all labs ordered are listed, but only abnormal results are displayed) Labs Reviewed  URINALYSIS, ROUTINE W REFLEX MICROSCOPIC - Abnormal; Notable for the following:       Result Value   APPearance CLOUDY (*)    Glucose, UA 150 (*)    Hgb urine dipstick SMALL (*)  Protein, ur >=300 (*)    Leukocytes, UA LARGE (*)    Bacteria, UA MANY (*)    Squamous Epithelial / LPF 0-5 (*)    All other components within normal limits  BASIC METABOLIC PANEL - Abnormal; Notable for the following:    Chloride 98 (*)    Glucose, Bld 118 (*)    BUN 29 (*)    Creatinine, Ser 5.24 (*)    GFR calc non Af Amer 8 (*)    GFR calc Af Amer 10 (*)    All other components within normal limits  CBC WITH DIFFERENTIAL/PLATELET - Abnormal; Notable for the following:    WBC 12.1 (*)    RBC 3.72 (*)    MCV 100.5 (*)    RDW 16.4 (*)    Neutro Abs 10.2 (*)    All other components within normal limits  URINE CULTURE  CULTURE, BLOOD (ROUTINE X 2)  CULTURE, BLOOD (ROUTINE X 2)  BASIC METABOLIC PANEL  CBC  I-STAT CG4 LACTIC ACID, ED  I-STAT CG4 LACTIC ACID, ED    Radiology Ct Renal Stone Study  Result Date: 09/12/2016 CLINICAL DATA:  Left flank pain beginning 3 hours ago. Patient is on dialysis with last treatment yesterday. EXAM: CT ABDOMEN AND PELVIS WITHOUT CONTRAST TECHNIQUE: Multidetector CT imaging of the abdomen and pelvis was performed following the standard  protocol without IV contrast. COMPARISON:  None. FINDINGS: Lower chest: Stable cardiomegaly.  No acute pulmonary disease. Hepatobiliary: Go status post cholecystectomy. The unenhanced liver is unremarkable. Pancreas: Mild pancreatic atrophy without ductal dilatation or mass. No peripancreatic inflammation. Spleen: No splenomegaly or focal mass lesion. Adrenals/Urinary Tract: Normal appearing adrenal glands. Perinephric fat stranding is again visualized about both kidneys more so on the left with caliectasis in dilated extrarenal pelves. Mild thickening of the renal pelvic walls and proximal ureters is again identified though not well characterized given lack of IV contrast. Findings may reflect changes of chronic urinary tract infection given the moderate diffuse transmural thickening of the bladder. No nephrolithiasis is noted. Stomach/Bowel: The stomach is nondistended. No gastric wall thickening. No evidence of bowel obstruction. There is normal small bowel rotation. No bowel obstruction is identified. There is diverticular changes along the descending colon and sigmoid without evidence of diverticulitis. Vascular/Lymphatic: There is abdominal aorto iliac atherosclerosis without aneurysm. Small lymph nodes are identified in the gastrohepatic and hepatoduodenal ligaments with small left para-aortic lymph node again noted. No pelvic sidewall lymphadenopathy. Reproductive: Hysterectomy.  No adnexal mass. Other: Diastases of the rectus muscles with slight protrusion of omental fat and large bowel as before along the lower ventral abdomen without obstruction. Musculoskeletal: No worrisome lytic or sclerotic osseous lesions. No acute osseous appearing abnormality. IMPRESSION: 1. Chronic perinephric fat stranding more so on the left without obstructive uropathy. There is mild mural thickening of the renal pelves. This in conjunction with moderate thickening of the bladder is believed to reflect changes of chronic  urinary tract infection and less likely due to urothelial neoplasm given the chronicity and lack of significant change since prior as well as bilaterality of this finding. 2. Colonic diverticulosis without acute diverticulitis. 3. Cholecystectomy.  Hysterectomy. Electronically Signed   By: Ashley Royalty M.D.   On: 09/12/2016 01:00    Procedures Procedures (including critical care time)  Medications Ordered in ED Medications  oxyCODONE-acetaminophen (PERCOCET/ROXICET) 5-325 MG per tablet 1 tablet (1 tablet Oral Given 09/11/16 2327)  ondansetron (ZOFRAN) 4 MG/2ML injection (not administered)  HYDROmorphone (DILAUDID) 1 MG/ML injection (  not administered)  cefTRIAXone (ROCEPHIN) 1 g in dextrose 5 % 50 mL IVPB (not administered)  ondansetron (ZOFRAN-ODT) 4 MG disintegrating tablet (4 mg  Given 09/11/16 2331)  ondansetron (ZOFRAN) injection 4 mg (4 mg Intravenous Given 09/12/16 0022)  morphine 4 MG/ML injection 4 mg (4 mg Intravenous Given 09/12/16 0022)  ondansetron (ZOFRAN) injection 4 mg (4 mg Intravenous Given 09/12/16 0145)  HYDROmorphone (DILAUDID) injection 1 mg (1 mg Intravenous Given 09/12/16 0150)  cefTRIAXone (ROCEPHIN) 1 g in dextrose 5 % 50 mL IVPB (1 g Intravenous New Bag/Given 09/12/16 0248)     Initial Impression / Assessment and Plan / ED Course  I have reviewed the triage vital signs and the nursing notes.  Pertinent labs & imaging results that were available during my care of the patient were reviewed by me and considered in my medical decision making (see chart for details).  Left flank pain of uncertain cause. Pattern of pain is suggestive of renal colic. Review of old records shows a hospitalization in February at which time she had pyelonephritis and perinephric abscess. Review of CT scan done at that time shows no renal calculi. She started dialysis for months ago, so cannot receive IV contrast. We'll send for renal stone protocol CT scan. She does make urine, so urine will be  checked for evidence of infection.  Urine shows definite evidence of infection. CT scan shows probable persistent perinephric abscess of. She has required several doses of narcotics to get adequate pain relief. She is started on ceftriaxone for her urinary tract infection. Case is discussed with Dr. Blaine Hamper of [facility] who agrees to admit the patient.  Final Clinical Impressions(s) / ED Diagnoses   Final diagnoses:  Pyelonephritis  Perinephric abscess  End-stage renal disease on hemodialysis ([place])  Anemia in chronic kidney disease, on chronic dialysis [facility])    New Prescriptions New Prescriptions   No medications on file   I personally performed the services described in this documentation, which was scribed in my presence. The recorded information has been reviewed and is accurate.       Delora Fuel, MD 00/92/33 [phone]

## 2016-09-11 NOTE — ED Triage Notes (Signed)
Pt endorses left sided flank pain that began 3 hours ago with n/v. Pt is on dialysis and last treatment yesterday. Pt tearful and appears very uncomfortable in triage. Pt had eye surgery this morning. Also endorses dysuria x 1 week.

## 2016-09-12 ENCOUNTER — Emergency Department (HOSPITAL_COMMUNITY): Payer: BLUE CROSS/BLUE SHIELD

## 2016-09-12 DIAGNOSIS — N151 Renal and perinephric abscess: Secondary | ICD-10-CM | POA: Diagnosis present

## 2016-09-12 DIAGNOSIS — N2581 Secondary hyperparathyroidism of renal origin: Secondary | ICD-10-CM | POA: Diagnosis present

## 2016-09-12 DIAGNOSIS — Z882 Allergy status to sulfonamides status: Secondary | ICD-10-CM | POA: Diagnosis not present

## 2016-09-12 DIAGNOSIS — E1129 Type 2 diabetes mellitus with other diabetic kidney complication: Secondary | ICD-10-CM | POA: Diagnosis not present

## 2016-09-12 DIAGNOSIS — Z880 Allergy status to penicillin: Secondary | ICD-10-CM | POA: Diagnosis not present

## 2016-09-12 DIAGNOSIS — K219 Gastro-esophageal reflux disease without esophagitis: Secondary | ICD-10-CM | POA: Diagnosis not present

## 2016-09-12 DIAGNOSIS — N12 Tubulo-interstitial nephritis, not specified as acute or chronic: Secondary | ICD-10-CM

## 2016-09-12 DIAGNOSIS — E1122 Type 2 diabetes mellitus with diabetic chronic kidney disease: Secondary | ICD-10-CM | POA: Diagnosis present

## 2016-09-12 DIAGNOSIS — I10 Essential (primary) hypertension: Secondary | ICD-10-CM | POA: Diagnosis not present

## 2016-09-12 DIAGNOSIS — I120 Hypertensive chronic kidney disease with stage 5 chronic kidney disease or end stage renal disease: Secondary | ICD-10-CM | POA: Diagnosis not present

## 2016-09-12 DIAGNOSIS — N1330 Unspecified hydronephrosis: Secondary | ICD-10-CM | POA: Diagnosis not present

## 2016-09-12 DIAGNOSIS — Z8711 Personal history of peptic ulcer disease: Secondary | ICD-10-CM | POA: Diagnosis not present

## 2016-09-12 DIAGNOSIS — Z87891 Personal history of nicotine dependence: Secondary | ICD-10-CM | POA: Diagnosis not present

## 2016-09-12 DIAGNOSIS — Z992 Dependence on renal dialysis: Secondary | ICD-10-CM

## 2016-09-12 DIAGNOSIS — R109 Unspecified abdominal pain: Secondary | ICD-10-CM | POA: Diagnosis not present

## 2016-09-12 DIAGNOSIS — D631 Anemia in chronic kidney disease: Secondary | ICD-10-CM | POA: Diagnosis not present

## 2016-09-12 DIAGNOSIS — B9620 Unspecified Escherichia coli [E. coli] as the cause of diseases classified elsewhere: Secondary | ICD-10-CM | POA: Diagnosis present

## 2016-09-12 DIAGNOSIS — Z79899 Other long term (current) drug therapy: Secondary | ICD-10-CM | POA: Diagnosis not present

## 2016-09-12 DIAGNOSIS — N136 Pyonephrosis: Secondary | ICD-10-CM | POA: Diagnosis present

## 2016-09-12 DIAGNOSIS — N186 End stage renal disease: Secondary | ICD-10-CM | POA: Diagnosis not present

## 2016-09-12 DIAGNOSIS — Z8249 Family history of ischemic heart disease and other diseases of the circulatory system: Secondary | ICD-10-CM | POA: Diagnosis not present

## 2016-09-12 LAB — CBC WITH DIFFERENTIAL/PLATELET
Basophils Absolute: 0 10*3/uL (ref 0.0–0.1)
Basophils Relative: 0 %
EOS ABS: 0 10*3/uL (ref 0.0–0.7)
EOS PCT: 0 %
HCT: 37.4 % (ref 36.0–46.0)
Hemoglobin: 12.4 g/dL (ref 12.0–15.0)
LYMPHS ABS: 1.2 10*3/uL (ref 0.7–4.0)
LYMPHS PCT: 10 %
MCH: 33.3 pg (ref 26.0–34.0)
MCHC: 33.2 g/dL (ref 30.0–36.0)
MCV: 100.5 fL — AB (ref 78.0–100.0)
MONO ABS: 0.7 10*3/uL (ref 0.1–1.0)
Monocytes Relative: 6 %
Neutro Abs: 10.2 10*3/uL — ABNORMAL HIGH (ref 1.7–7.7)
Neutrophils Relative %: 84 %
PLATELETS: 209 10*3/uL (ref 150–400)
RBC: 3.72 MIL/uL — ABNORMAL LOW (ref 3.87–5.11)
RDW: 16.4 % — AB (ref 11.5–15.5)
WBC: 12.1 10*3/uL — ABNORMAL HIGH (ref 4.0–10.5)

## 2016-09-12 LAB — URINALYSIS, ROUTINE W REFLEX MICROSCOPIC
Bilirubin Urine: NEGATIVE
GLUCOSE, UA: 150 mg/dL — AB
KETONES UR: NEGATIVE mg/dL
Nitrite: NEGATIVE
Specific Gravity, Urine: 1.009 (ref 1.005–1.030)
pH: 8 (ref 5.0–8.0)

## 2016-09-12 LAB — BASIC METABOLIC PANEL
ANION GAP: 14 (ref 5–15)
Anion gap: 14 (ref 5–15)
BUN: 29 mg/dL — AB (ref 6–20)
BUN: 31 mg/dL — AB (ref 6–20)
CHLORIDE: 96 mmol/L — AB (ref 101–111)
CO2: 26 mmol/L (ref 22–32)
CO2: 27 mmol/L (ref 22–32)
Calcium: 9.3 mg/dL (ref 8.9–10.3)
Calcium: 9.1 mg/dL (ref 8.9–10.3)
Chloride: 98 mmol/L — ABNORMAL LOW (ref 101–111)
Creatinine, Ser: 5.24 mg/dL — ABNORMAL HIGH (ref 0.44–1.00)
Creatinine, Ser: 5.65 mg/dL — ABNORMAL HIGH (ref 0.44–1.00)
GFR calc Af Amer: 10 mL/min — ABNORMAL LOW (ref >60)
GFR calc Af Amer: 9 mL/min — ABNORMAL LOW (ref >60)
GFR, EST NON AFRICAN AMERICAN: 8 mL/min — AB (ref >60)
GFR, EST NON AFRICAN AMERICAN: 8 mL/min — AB (ref >60)
GLUCOSE: 118 mg/dL — AB (ref 65–99)
Glucose, Bld: 138 mg/dL — ABNORMAL HIGH (ref 65–99)
Potassium: 4.1 mmol/L (ref 3.5–5.1)
Potassium: 4.6 mmol/L (ref 3.5–5.1)
SODIUM: 137 mmol/L (ref 135–145)
Sodium: 138 mmol/L (ref 135–145)

## 2016-09-12 LAB — CBC
HEMATOCRIT: 36.7 % (ref 36.0–46.0)
Hemoglobin: 11.9 g/dL — ABNORMAL LOW (ref 12.0–15.0)
MCH: 33.3 pg (ref 26.0–34.0)
MCHC: 32.4 g/dL (ref 30.0–36.0)
MCV: 102.8 fL — AB (ref 78.0–100.0)
PLATELETS: 174 10*3/uL (ref 150–400)
RBC: 3.57 MIL/uL — AB (ref 3.87–5.11)
RDW: 16.4 % — AB (ref 11.5–15.5)
WBC: 13.2 10*3/uL — AB (ref 4.0–10.5)

## 2016-09-12 LAB — MRSA PCR SCREENING: MRSA by PCR: NEGATIVE

## 2016-09-12 LAB — I-STAT CG4 LACTIC ACID, ED: Lactic Acid, Venous: 1.32 mmol/L (ref 0.5–1.9)

## 2016-09-12 MED ORDER — ONDANSETRON HCL 4 MG/2ML IJ SOLN
INTRAMUSCULAR | Status: AC
  Filled 2016-09-12: qty 2

## 2016-09-12 MED ORDER — DEXTROSE 5 % IV SOLN
1.0000 g | Freq: Once | INTRAVENOUS | Status: AC
  Administered 2016-09-12: 1 g via INTRAVENOUS
  Filled 2016-09-12: qty 10

## 2016-09-12 MED ORDER — ONDANSETRON HCL 4 MG/2ML IJ SOLN
4.0000 mg | Freq: Once | INTRAMUSCULAR | Status: AC
  Administered 2016-09-12: 4 mg via INTRAVENOUS
  Filled 2016-09-12: qty 2

## 2016-09-12 MED ORDER — LABETALOL HCL 300 MG PO TABS
300.0000 mg | ORAL_TABLET | ORAL | Status: DC
  Administered 2016-09-13 – 2016-09-14 (×2): 300 mg via ORAL
  Filled 2016-09-12 (×2): qty 1

## 2016-09-12 MED ORDER — HYDROMORPHONE HCL 1 MG/ML IJ SOLN
INTRAMUSCULAR | Status: AC
  Filled 2016-09-12: qty 1

## 2016-09-12 MED ORDER — MORPHINE SULFATE (PF) 4 MG/ML IV SOLN
4.0000 mg | Freq: Once | INTRAVENOUS | Status: AC
  Administered 2016-09-12: 4 mg via INTRAVENOUS
  Filled 2016-09-12: qty 1

## 2016-09-12 MED ORDER — HYDRALAZINE HCL 25 MG PO TABS: 25.0000 mg | ORAL_TABLET | Freq: Three times a day (TID) | ORAL | Status: DC

## 2016-09-12 MED ORDER — HYDRALAZINE HCL 20 MG/ML IJ SOLN
5.0000 mg | INTRAMUSCULAR | Status: DC | PRN
  Administered 2016-09-12 (×2): 5 mg via INTRAVENOUS
  Filled 2016-09-12 (×2): qty 1

## 2016-09-12 MED ORDER — DIFLUPREDNATE 0.05 % OP EMUL: 1.0000 [drp] | Freq: Every day | OPHTHALMIC | Status: DC

## 2016-09-12 MED ORDER — HYDRALAZINE HCL 25 MG PO TABS
25.0000 mg | ORAL_TABLET | ORAL | Status: DC
  Administered 2016-09-12: 25 mg via ORAL
  Filled 2016-09-12: qty 1

## 2016-09-12 MED ORDER — DEXTROSE 5 % IV SOLN
1.0000 g | Freq: Once | INTRAVENOUS | Status: DC
  Filled 2016-09-12: qty 10

## 2016-09-12 MED ORDER — NEPAFENAC 0.3 % OP SUSP
1.0000 [drp] | Freq: Two times a day (BID) | OPHTHALMIC | Status: DC
  Filled 2016-09-12: qty 1.7

## 2016-09-12 MED ORDER — DEXTROSE 5 % IV SOLN
1.0000 g | INTRAVENOUS | Status: DC
  Administered 2016-09-13 – 2016-09-14 (×2): 1 g via INTRAVENOUS
  Filled 2016-09-12 (×3): qty 10

## 2016-09-12 MED ORDER — FAMOTIDINE 20 MG PO TABS
20.0000 mg | ORAL_TABLET | Freq: Every day | ORAL | Status: DC
  Administered 2016-09-12 – 2016-09-13 (×2): 20 mg via ORAL
  Filled 2016-09-12 (×2): qty 1

## 2016-09-12 MED ORDER — ACETAMINOPHEN 325 MG PO TABS
650.0000 mg | ORAL_TABLET | Freq: Four times a day (QID) | ORAL | Status: DC | PRN
  Administered 2016-09-12 – 2016-09-13 (×2): 650 mg via ORAL
  Filled 2016-09-12 (×2): qty 2

## 2016-09-12 MED ORDER — VANCOMYCIN HCL IN DEXTROSE 750-5 MG/150ML-% IV SOLN
INTRAVENOUS | Status: AC
  Filled 2016-09-12: qty 150

## 2016-09-12 MED ORDER — HYDRALAZINE HCL 25 MG PO TABS
25.0000 mg | ORAL_TABLET | ORAL | Status: DC
  Administered 2016-09-14: 25 mg via ORAL
  Filled 2016-09-12 (×3): qty 1

## 2016-09-12 MED ORDER — AMLODIPINE BESYLATE 10 MG PO TABS: 10.0000 mg | ORAL_TABLET | ORAL | Status: DC

## 2016-09-12 MED ORDER — DEXTROSE 5 % IV SOLN: 1.0000 g | Freq: Once | INTRAVENOUS | Status: DC

## 2016-09-12 MED ORDER — NEPRO/CARBSTEADY PO LIQD
237.0000 mL | Freq: Two times a day (BID) | ORAL | Status: DC
  Administered 2016-09-14: 237 mL via ORAL

## 2016-09-12 MED ORDER — LABETALOL HCL 300 MG PO TABS
300.0000 mg | ORAL_TABLET | Freq: Every day | ORAL | Status: DC
  Administered 2016-09-12: 300 mg via ORAL
  Filled 2016-09-12 (×2): qty 1

## 2016-09-12 MED ORDER — ONDANSETRON HCL 4 MG/2ML IJ SOLN
4.0000 mg | Freq: Three times a day (TID) | INTRAMUSCULAR | Status: DC | PRN
  Administered 2016-09-12: 4 mg via INTRAVENOUS
  Filled 2016-09-12: qty 2

## 2016-09-12 MED ORDER — MORPHINE SULFATE (PF) 4 MG/ML IV SOLN: 2.0000 mg | INTRAVENOUS | Status: DC | PRN

## 2016-09-12 MED ORDER — OFLOXACIN 0.3 % OP SOLN
1.0000 [drp] | Freq: Two times a day (BID) | OPHTHALMIC | Status: DC
  Administered 2016-09-12 – 2016-09-14 (×4): 1 [drp] via OPHTHALMIC
  Filled 2016-09-12: qty 5

## 2016-09-12 MED ORDER — ZOLPIDEM TARTRATE 5 MG PO TABS: 5.0000 mg | ORAL_TABLET | Freq: Every evening | ORAL | Status: DC | PRN

## 2016-09-12 MED ORDER — ACETAMINOPHEN 650 MG RE SUPP: 650.0000 mg | Freq: Four times a day (QID) | RECTAL | Status: DC | PRN

## 2016-09-12 MED ORDER — NEPAFENAC 0.3 % OP SUSP
1.0000 [drp] | Freq: Two times a day (BID) | OPHTHALMIC | Status: DC
Start: 2016-09-12 — End: 2016-09-14
  Administered 2016-09-12 – 2016-09-14 (×5): 1 [drp] via OPHTHALMIC
  Filled 2016-09-12: qty 1.7

## 2016-09-12 MED ORDER — LABETALOL HCL 300 MG PO TABS
150.0000 mg | ORAL_TABLET | ORAL | Status: DC
  Filled 2016-09-12: qty 0.5

## 2016-09-12 MED ORDER — HEPARIN SODIUM (PORCINE) 5000 UNIT/ML IJ SOLN
5000.0000 [IU] | Freq: Three times a day (TID) | INTRAMUSCULAR | Status: DC
  Administered 2016-09-12 – 2016-09-14 (×5): 5000 [IU] via SUBCUTANEOUS
  Filled 2016-09-12 (×6): qty 1

## 2016-09-12 MED ORDER — OFLOXACIN 0.3 % OP SOLN
1.0000 [drp] | Freq: Four times a day (QID) | OPHTHALMIC | Status: DC
  Administered 2016-09-12: 1 [drp] via OPHTHALMIC
  Filled 2016-09-12: qty 5

## 2016-09-12 MED ORDER — ONDANSETRON HCL 4 MG/2ML IJ SOLN
4.0000 mg | Freq: Once | INTRAMUSCULAR | Status: AC
  Administered 2016-09-12: 4 mg via INTRAVENOUS

## 2016-09-12 MED ORDER — HYDROMORPHONE HCL 1 MG/ML IJ SOLN
1.0000 mg | Freq: Once | INTRAMUSCULAR | Status: AC
  Administered 2016-09-12: 1 mg via INTRAVENOUS

## 2016-09-12 MED ORDER — CIPROFLOXACIN HCL 0.3 % OP SOLN
1.0000 [drp] | Freq: Four times a day (QID) | OPHTHALMIC | Status: DC
  Administered 2016-09-12: 1 [drp] via OPHTHALMIC
  Filled 2016-09-12: qty 2.5

## 2016-09-12 MED ORDER — DIFLUPREDNATE 0.05 % OP EMUL
1.0000 [drp] | Freq: Two times a day (BID) | OPHTHALMIC | Status: DC
  Administered 2016-09-12 – 2016-09-14 (×5): 1 [drp] via OPHTHALMIC
  Filled 2016-09-12: qty 5

## 2016-09-12 MED ORDER — VANCOMYCIN HCL 10 G IV SOLR
1500.0000 mg | INTRAVENOUS | Status: AC
  Administered 2016-09-12: 1500 mg via INTRAVENOUS
  Filled 2016-09-12: qty 1500

## 2016-09-12 MED ORDER — HYDROXYZINE HCL 50 MG/ML IM SOLN
25.0000 mg | Freq: Four times a day (QID) | INTRAMUSCULAR | Status: DC | PRN
  Administered 2016-09-12: 25 mg via INTRAMUSCULAR
  Filled 2016-09-12: qty 1

## 2016-09-12 MED ORDER — VANCOMYCIN HCL IN DEXTROSE 750-5 MG/150ML-% IV SOLN
750.0000 mg | INTRAVENOUS | Status: DC
  Filled 2016-09-12: qty 150

## 2016-09-12 MED ORDER — OXYCODONE-ACETAMINOPHEN 5-325 MG PO TABS: 1.0000 | ORAL_TABLET | ORAL | Status: DC | PRN

## 2016-09-12 NOTE — ED Notes (Signed)
Pt is in room vomiting.

## 2016-09-12 NOTE — Progress Notes (Signed)
Pharmacy: Home Med Clarification  The patient's home eye drop regimen was discussed with the husband earlier today. The patient had recent cataract surgery on 5/17 and post-op had prescriptions written for the following:  1) Ofloxacin 0.3% instill 1 drop in the L-eye twice daily for 1 week 2) Durezol 0.05% instill 1 drop in the L-eye twice daily for 1 week followed by 1 drop once daily in the L-eye for 1 week 3) Ilevro 0.3% instill 1 drop in the L-eye twice daily for 2 weeks  This was discussed with Dr. Darrick Meigs and will be resumed this admission.   Alycia Rossetti, PharmD, BCPS Clinical Pharmacist Pager: [phone] Clinical phone for 09/12/2016 from 7a-3:30p: [phone] If after 3:30p, please call main pharmacy at: [phone] 09/12/2016 1:53 PM

## 2016-09-12 NOTE — Progress Notes (Signed)
Initial Nutrition Assessment  DOCUMENTATION CODES:   Not applicable  INTERVENTION:   -Continue Nepro Shake as ordered BID  NUTRITION DIAGNOSIS:   Inadequate oral intake related to acute illness, nausea, vomiting as evidenced by per patient/family report, meal completion < 25%.  GOAL:   Patient will meet greater than or equal to 90% of their needs  MONITOR:   PO intake, Supplement acceptance, Vent status, Labs, Weight trends  REASON FOR ASSESSMENT:   Malnutrition Screening Tool    ASSESSMENT:   61 yo female admitted with left flank pain, N/V with pyelonephritis. Pt with hx of HTN, pre-DM, ESRD on HD (beginning in 05/2016), GERD   Pt does not feel well this AM, husband at bedside. Pt did not eat breakfast, vomited 20 minutes prior to this writer's visit. Pt sleeping on visit this AM, did not wake. Plan for HD today  Husband reports pt's appetite prior to yesterday was very good. Pt eating 3 meals per day plus multiple snacks. Pt has been on HD since Februrary of this year and husband reports the staff at the dialysis keeps telling pt how well she has been doing.   Reports weight has been stable, around 162 pounds. Current wt 160 pounds  Labs: reviewed Meds: reviewed  Diet Order:  Diet renal/carb modified with fluid restriction Diet-HS Snack? Nothing; Room service appropriate? Yes; Fluid consistency: Thin; Fluid restriction: 1500 mL Fluid  Skin:  Reviewed, no issues  Last BM:  no documented BM  Height:   Ht Readings from Last 1 Encounters:  09/11/16 5' 3.5" (1.613 m)    Weight:   Wt Readings from Last 1 Encounters:  09/12/16 160 lb (72.6 kg)    BMI:  Body mass index is 27.9 kg/m.  Estimated Nutritional Needs:   Kcal:  2000-2300 kcals  Protein:  88-102 g  Fluid:  1000 mL plus UOP  EDUCATION NEEDS:   No education needs identified at this time  [place], [place], LDN [phone] Pager  [phone] Weekend/On-Call Pager

## 2016-09-12 NOTE — H&P (Addendum)
History and Physical    Alison Baker GYK:[id number] DOB: [birth year] DOA: 09/11/2016  Referring MD/NP/PA:   PCP: Melony Overly, MD   Patient coming from:  The patient is coming from home.  At baseline, pt is independent for most of ADL.   Chief Complaint: Left flank pain, dysuria, nausea and vomiting  HPI: Alison Baker is a 61 y.o. female with medical history significant of hypertension, prediabetes, GERD, gastric ulcer with bleeding, anemia, left kidney abscess in February 2018, ESRD-HD (MWF), who presents with left flank pain, nausea or vomiting.  Patient states that she started having left flank pain last night. It is constant, severe, nonradiating. It is associated with nausea and vomiting. She vomited more than 10 times. No diarrhea, abdominal pain, fever or chills. Patient also has dysuria. She denies chest pain, shortness of breath, cough, hematemesis or hematuria. Patient states that she has been compliant to dialysis. Last dialysis was on Wednsday. Pt states that she had left eye cataract surgery on Thursday, she is using ciprofloxacin and Ofloxacin eye drops now.  ED Course: pt was found to have WBC 12.1, lactate 1.66, 1.32, potassium 4.1, bicarbonate 26, creatinine 5.24, BUN 29, positive urinalysis with large amount of leukocyte, temperature 99.1, no tachycardia, oxygen saturation 90-100% on room air. Patient is admitted to [facility] bed as inpatient.  CT per renal stone protocol showed 1. Chronic perinephric fat stranding more so on the left without obstructive uropathy. There is mild mural thickening of the renal pelves. This in conjunction with moderate thickening of the bladder is believed to reflect changes of chronic urinary tract infection and less likely due to urothelial neoplasm given the chronicity and lack of significant change since prior as well as bilaterality of this finding. 2. Colonic diverticulosis without acute diverticulitis. 3. Cholecystectomy.   Hysterectomy.  Review of Systems:   General: has fevers, chills, no changes in body weight, has poor appetite, has fatigue HEENT: no blurry vision, hearing changes or sore throat Respiratory: no dyspnea, coughing, wheezing CV: no chest pain, no palpitations GI: no nausea, vomiting, no abdominal pain, diarrhea, constipation. Has left flank pain GU: has dysuria, burning on urination, no  increased urinary frequency, hematuria  Ext: no leg edema Neuro: no unilateral weakness, numbness, or tingling, no vision change or hearing loss Skin: no rash, no skin tear. MSK: No muscle spasm, no deformity, no limitation of range of movement in spin Heme: No easy bruising.  Travel history: No recent long distant travel.  Allergy:  Allergies  Allergen Reactions  . Strawberry Extract Swelling  . Nsaids Other (See Comments)    Has a healing stomach ulcer  . Clonidine Nausea And Vomiting  . Penicillins Rash and Other (See Comments)    Rash and rib pain. Has patient had a PCN reaction causing immediate rash, facial/tongue/throat swelling, SOB or lightheadedness with hypotension: No Has patient had a PCN reaction causing severe rash involving mucus membranes or skin necrosis: No Has patient had a PCN reaction that required hospitalization: No Has patient had a PCN reaction occurring within the last 10 years: No If all of the above answers are "NO", then may proceed with Cephalosporin use.   . Sulfa Antibiotics Rash and Other (See Comments)    Rib pain    Past Medical History:  Diagnosis Date  . Anemia   . Complication of anesthesia    slow to awaken, sentive to medications rarely even takes a Tylenol.  . Diarrhea   . End-stage kidney disease ([place])   .  Gastric ulcer with hemorrhage   . GERD (gastroesophageal reflux disease)   . Hypertension   . PONV (postoperative nausea and vomiting)    after hysterectomy- 1980's  . Pre-diabetes     Past Surgical History:  Procedure Laterality Date  .  ABDOMINAL HYSTERECTOMY    . BASCILIC VEIN TRANSPOSITION Left 07/15/2016   Procedure: BRACHIAL VEIN TRANSPOSITION FIRST STAGE;  Surgeon: Conrad , MD;  Location: [facility];  Service: Vascular;  Laterality: Left;  . CHOLECYSTECTOMY OPEN    . COLONOSCOPY W/ POLYPECTOMY    . EYE SURGERY Right    catarct  . IR GENERIC HISTORICAL  06/23/2016   IR US GUIDE VASC ACCESS RIGHT 06/23/2016 Arne Cleveland, MD [facility]  . IR GENERIC HISTORICAL  06/23/2016   IR FLUORO GUIDE CV LINE RIGHT 06/23/2016 Arne Cleveland, MD [facility]  . TOTAL ABDOMINAL HYSTERECTOMY W/ BILATERAL SALPINGOOPHORECTOMY    . TRABECULECTOMY Bilateral     Social History:  reports that she quit smoking about 21 years ago. Her smoking use included Cigars. She has never used smokeless tobacco. She reports that she does not drink alcohol or use drugs.  Family History:  Family History  Problem Relation Age of Onset  . Hypertension Mother      Prior to Admission medications   Medication Sig Start Date End Date Taking? Authorizing Provider  amLODipine (NORVASC) 10 MG tablet Take 10 mg by mouth every other day.  09/09/15 09/12/16 Yes [provider]  ciprofloxacin (CILOXAN) 0.3 % ophthalmic solution Place 1 drop into the left eye 4 (four) times daily. 08/07/16  Yes [provider]  famotidine (PEPCID) 20 MG tablet Take 1 tablet (20 mg total) by mouth at bedtime. 06/23/16  Yes Domenic Polite, MD  hydrALAZINE (APRESOLINE) 25 MG tablet Take 1 tablet (25 mg total) by mouth every 8 (eight) hours. Patient taking differently: Take 25 mg by mouth every 8 (eight) hours. On dialysis days MWF, takes 25mg  in the AM and PM only. 06/23/16  Yes Domenic Polite, MD  labetalol (NORMODYNE) 300 MG tablet Take 1 tablet (300 mg total) by mouth 2 (two) times daily. Patient taking differently: Take 300 mg by mouth 2 (two) times daily. On dialysis days MWF, takes 150mg  in the AM and 300mg  in the PM. 06/23/16  Yes Domenic Polite, MD  ofloxacin  (OCUFLOX) 0.3 % ophthalmic solution Place 1 drop into the left eye 4 (four) times daily. 09/10/16  Yes [provider]  oxyCODONE-acetaminophen (PERCOCET/ROXICET) 5-325 MG tablet Take 1 tablet by mouth every 6 (six) hours as needed. Patient not taking: Reported on 09/05/2016 07/15/16   Ulyses Amor, PA-C    Physical Exam: Vitals:   09/12/16 0330 09/12/16 0400 09/12/16 0430 09/12/16 0506  BP: (!) 139/91 (!) 177/82 (!) 185/79 (!) 197/80  Pulse: 79 78 77   Resp:      Temp:      TempSrc:      SpO2: 100% 98% 94%   Weight:      Height:       General: In moderate acute distress HEENT:       Eyes: PERRL, EOMI, no scleral icterus.       ENT: No discharge from the ears and nose, no pharynx injection, no tonsillar enlargement.        Neck: No JVD, no bruit, no mass felt. Heme: No neck lymph node enlargement. Cardiac: S1/S2, RRR, No murmurs, No gallops or rubs. Respiratory:  No rales, wheezing, rhonchi or rubs. GI:  Soft, nondistended, nontender, no rebound pain, no organomegaly, BS present. Has left CVA tenteness. GU: No hematuria Ext: No pitting leg edema bilaterally. 2+DP/PT pulse bilaterally. Has AVF in left arm. Musculoskeletal: No joint deformities, No joint redness or warmth, no limitation of ROM in spin. Skin: No rashes.  Neuro: Alert, oriented X3, cranial nerves II-XII grossly intact, moves all extremities normally. Psych: Patient is not psychotic, no suicidal or hemocidal ideation.  Labs on Admission: I have personally reviewed following labs and imaging studies  CBC:  Recent Labs Lab 09/11/16 2328  WBC 12.1*  NEUTROABS 10.2*  HGB 12.4  HCT 37.4  MCV 100.5*  PLT 081   Basic Metabolic Panel:  Recent Labs Lab 09/11/16 2322  NA 138  K 4.1  CL 98*  CO2 26  GLUCOSE 118*  BUN 29*  CREATININE 5.24*  CALCIUM 9.3   GFR: Estimated Creatinine Clearance: 11.4 mL/min (A) (by C-G formula based on SCr of 5.24 mg/dL (H)). Liver Function Tests: No results for  input(s): AST, ALT, ALKPHOS, BILITOT, PROT, ALBUMIN in the last 168 hours. No results for input(s): LIPASE, AMYLASE in the last 168 hours. No results for input(s): AMMONIA in the last 168 hours. Coagulation Profile: No results for input(s): INR, PROTIME in the last 168 hours. Cardiac Enzymes: No results for input(s): CKTOTAL, CKMB, CKMBINDEX, TROPONINI in the last 168 hours. BNP (last 3 results) No results for input(s): PROBNP in the last 8760 hours. HbA1C: No results for input(s): HGBA1C in the last 72 hours. CBG: No results for input(s): GLUCAP in the last 168 hours. Lipid Profile: No results for input(s): CHOL, HDL, LDLCALC, TRIG, CHOLHDL, LDLDIRECT in the last 72 hours. Thyroid Function Tests: No results for input(s): TSH, T4TOTAL, FREET4, T3FREE, THYROIDAB in the last 72 hours. Anemia Panel: No results for input(s): VITAMINB12, FOLATE, FERRITIN, TIBC, IRON, RETICCTPCT in the last 72 hours. Urine analysis:    Component Value Date/Time   COLORURINE YELLOW 09/12/2016 0155   APPEARANCEUR CLOUDY (A) 09/12/2016 0155   LABSPEC 1.009 09/12/2016 0155   PHURINE 8.0 09/12/2016 0155   GLUCOSEU 150 (A) 09/12/2016 0155   HGBUR SMALL (A) 09/12/2016 0155   BILIRUBINUR NEGATIVE 09/12/2016 0155   KETONESUR NEGATIVE 09/12/2016 0155   PROTEINUR >=300 (A) 09/12/2016 0155   NITRITE NEGATIVE 09/12/2016 0155   LEUKOCYTESUR LARGE (A) 09/12/2016 0155   Sepsis Labs: @LABRCNTIP (procalcitonin:4,lacticidven:4) )No results found for this or any previous visit (from the past 240 hour(s)).   Radiological Exams on Admission: Ct Renal Stone Study  Result Date: 09/12/2016 CLINICAL DATA:  Left flank pain beginning 3 hours ago. Patient is on dialysis with last treatment yesterday. EXAM: CT ABDOMEN AND PELVIS WITHOUT CONTRAST TECHNIQUE: Multidetector CT imaging of the abdomen and pelvis was performed following the standard protocol without IV contrast. COMPARISON:  None. FINDINGS: Lower chest: Stable  cardiomegaly.  No acute pulmonary disease. Hepatobiliary: Go status post cholecystectomy. The unenhanced liver is unremarkable. Pancreas: Mild pancreatic atrophy without ductal dilatation or mass. No peripancreatic inflammation. Spleen: No splenomegaly or focal mass lesion. Adrenals/Urinary Tract: Normal appearing adrenal glands. Perinephric fat stranding is again visualized about both kidneys more so on the left with caliectasis in dilated extrarenal pelves. Mild thickening of the renal pelvic walls and proximal ureters is again identified though not well characterized given lack of IV contrast. Findings may reflect changes of chronic urinary tract infection given the moderate diffuse transmural thickening of the bladder. No nephrolithiasis is noted. Stomach/Bowel: The stomach is nondistended. No gastric wall thickening. No evidence  of bowel obstruction. There is normal small bowel rotation. No bowel obstruction is identified. There is diverticular changes along the descending colon and sigmoid without evidence of diverticulitis. Vascular/Lymphatic: There is abdominal aorto iliac atherosclerosis without aneurysm. Small lymph nodes are identified in the gastrohepatic and hepatoduodenal ligaments with small left para-aortic lymph node again noted. No pelvic sidewall lymphadenopathy. Reproductive: Hysterectomy.  No adnexal mass. Other: Diastases of the rectus muscles with slight protrusion of omental fat and large bowel as before along the lower ventral abdomen without obstruction. Musculoskeletal: No worrisome lytic or sclerotic osseous lesions. No acute osseous appearing abnormality. IMPRESSION: 1. Chronic perinephric fat stranding more so on the left without obstructive uropathy. There is mild mural thickening of the renal pelves. This in conjunction with moderate thickening of the bladder is believed to reflect changes of chronic urinary tract infection and less likely due to urothelial neoplasm given the  chronicity and lack of significant change since prior as well as bilaterality of this finding. 2. Colonic diverticulosis without acute diverticulitis. 3. Cholecystectomy.  Hysterectomy. Electronically Signed   By: Ashley Royalty M.D.   On: 09/12/2016 01:00     EKG: Not done in ED, will get one.   Assessment/Plan Principal Problem:   Pyelonephritis Active Problems:   Essential hypertension   GERD (gastroesophageal reflux disease)   ESRD on dialysis [facility])   Pyelonephritis: Patient has positive urinalysis, dysuria, left flank pain, clinically consistent with pyelonephritis. No kidney stone by CT scan. Lactate is normal, clinically nonseptic. Hemodynamically stable. Given her history of left kidney abscess, will start antibiotics with broad coverage.   - Admit to med-surg bed as inpt -  Ceftriaxone by IV was started in ED, will continue -  Will add IV Vancomycine - Follow up results of urine and blood cx and amend antibiotic regimen if needed per sensitivity results - prn Zofran for nausea-->changed to prn hydroxyzine due to QTc 509 prologation - prn percocet and morphine for pain  HTN: Blood pressure 183/79, 177/82 -continue amlodipine, hydralazine, labetalol -IV hydralazine when necessary  GERD: -Pepcid   ESRD on dialysis (MWF): Patient has been compliant to dialysis. She started dialysis 4 months ago. potassium 4.1, bicarbonate 26, creatinine 5.24, BUN 29 -Left message to renal box for dialysis today   DVT ppx: SQ Heparin        Code Status: Full code Family Communication:  Yes, patient's husband at bed side Disposition Plan:  Anticipate discharge back to previous home environment Consults called:  none Admission status: medical floor/inpt  Date of Service 09/12/2016    Ivor Costa [facility] Pager [phone]  If 7PM-7AM, please contact night-coverage [email or web address] Password [email or web address] 09/12/2016, 5:13 AM

## 2016-09-12 NOTE — Progress Notes (Signed)
Pharmacy Antibiotic Note  Alison Baker is a 61 y.o. female admitted on 09/11/2016 with pyelonephritis and hx of left kidney abscess.  Pharmacy has been consulted for Vancomycin dosing. Pt also on Rocephin. Pt with ESRD - HD on M/W/F.   Plan: Vancomycin 1500mg  IV now then 750mg  IV qHD  Will f/u micro data, renal function, and pt's clinical condition Vanc pre-HD level prn   Height: 5' 3.5" (161.3 cm) Weight: 162 lb (73.5 kg) IBW/kg (Calculated) : 53.55  Temp (24hrs), Avg:98.9 F (37.2 C), Min:98.7 F (37.1 C), Max:99.1 F (37.3 C)   Recent Labs Lab 09/11/16 2322 09/11/16 2328 09/11/16 2351 09/12/16 0234  WBC  --  12.1*  --   --   CREATININE 5.24*  --   --   --   LATICACIDVEN  --   --  1.66 1.32    Estimated Creatinine Clearance: 11.4 mL/min (A) (by C-G formula based on SCr of 5.24 mg/dL (H)).    Allergies  Allergen Reactions  . Strawberry Extract Swelling  . Nsaids Other (See Comments)    Has a healing stomach ulcer  . Clonidine Nausea And Vomiting  . Penicillins Rash and Other (See Comments)    Rash and rib pain. Has patient had a PCN reaction causing immediate rash, facial/tongue/throat swelling, SOB or lightheadedness with hypotension: No Has patient had a PCN reaction causing severe rash involving mucus membranes or skin necrosis: No Has patient had a PCN reaction that required hospitalization: No Has patient had a PCN reaction occurring within the last 10 years: No If all of the above answers are "NO", then may proceed with Cephalosporin use.   . Sulfa Antibiotics Rash and Other (See Comments)    Rib pain    Antimicrobials this admission: 5/18 Rocephin >>  5/18 Vanc >>   Dose adjustments this admission: N/a  Microbiology results: 5/18 BCx x2:  5/18 UCx:    Thank you for allowing pharmacy to be a part of this patient's care.  Sindy Guadeloupe 09/12/2016 4:37 AM

## 2016-09-12 NOTE — Consult Note (Signed)
Reason for Consult:   Referring Physician: Eleonore Chiquito MD  Alison Baker is an 61 y.o. female.   HPI:   1 - Left Mild Hydronephrosis - mild left hydro to distal ureter by CT 08/2016 on evaluation left flank pain. NO h/o urolithiasis or GU instrumentation. No gross hematuria. There is some bladder thickening also noted.  2 - End Stage Renal Disease - on hemodialysis MWF since 05/2016 for suspect medial renal disease. CT 08/2016 with mild left hydro and preserved renal parenchyma bilaterally.   3 - Possible Pyelonephritis - mild left stranding by CT 08/2016. UCX's prior negative. UA with bacteruria this admissin and UCX pending. Placed on empiric Rocephin. No fevers / tachcardia / significant luekocytosis.   PMH sig for TAH/BSO, open chole, eye surgery.  Today "Alison Baker" is seen in consultation for above.   Past Medical History:  Diagnosis Date  . Anemia   . Complication of anesthesia    slow to awaken, sentive to medications rarely even takes a Tylenol.  . Diarrhea   . End-stage kidney disease ([place])   . Gastric ulcer with hemorrhage   . GERD (gastroesophageal reflux disease)   . Hypertension   . PONV (postoperative nausea and vomiting)    after hysterectomy- 1980's  . Pre-diabetes     Past Surgical History:  Procedure Laterality Date  . ABDOMINAL HYSTERECTOMY    . BASCILIC VEIN TRANSPOSITION Left 07/15/2016   Procedure: BRACHIAL VEIN TRANSPOSITION FIRST STAGE;  Surgeon: Conrad , MD;  Location: [facility];  Service: Vascular;  Laterality: Left;  . CHOLECYSTECTOMY OPEN    . COLONOSCOPY W/ POLYPECTOMY    . EYE SURGERY Right    catarct  . IR GENERIC HISTORICAL  06/23/2016   IR US GUIDE VASC ACCESS RIGHT 06/23/2016 Arne Cleveland, MD [facility]  . IR GENERIC HISTORICAL  06/23/2016   IR FLUORO GUIDE CV LINE RIGHT 06/23/2016 Arne Cleveland, MD [facility]  . TOTAL ABDOMINAL HYSTERECTOMY W/ BILATERAL SALPINGOOPHORECTOMY    . TRABECULECTOMY Bilateral     Family History  Problem  Relation Age of Onset  . Hypertension Mother     Social History:  reports that she quit smoking about 21 years ago. Her smoking use included Cigars. She has never used smokeless tobacco. She reports that she does not drink alcohol or use drugs.  Allergies:  Allergies  Allergen Reactions  . Strawberry Extract Swelling  . Nsaids Other (See Comments)    Has a healing stomach ulcer  . Clonidine Nausea And Vomiting  . Penicillins Rash and Other (See Comments)    Rash and rib pain. Has patient had a PCN reaction causing immediate rash, facial/tongue/throat swelling, SOB or lightheadedness with hypotension: No Has patient had a PCN reaction causing severe rash involving mucus membranes or skin necrosis: No Has patient had a PCN reaction that required hospitalization: No Has patient had a PCN reaction occurring within the last 10 years: No If all of the above answers are "NO", then may proceed with Cephalosporin use.   . Sulfa Antibiotics Rash and Other (See Comments)    Rib pain    Medications: I have reviewed the patient's current medications.  Results for orders placed or performed during the hospital encounter of 09/11/16 (from the past 48 hour(s))  Basic metabolic panel     Status: Abnormal   Collection Time: 09/11/16 11:22 PM  Result Value Ref Range   Sodium 138 135 - 145 mmol/L   Potassium 4.1 3.5 - 5.1 mmol/L  Chloride 98 (L) 101 - 111 mmol/L   CO2 26 22 - 32 mmol/L   Glucose, Bld 118 (H) 65 - 99 mg/dL   BUN 29 (H) 6 - 20 mg/dL   Creatinine, Ser 5.24 (H) 0.44 - 1.00 mg/dL   Calcium 9.3 8.9 - 10.3 mg/dL   GFR calc non Af Amer 8 (L) >60 mL/min   GFR calc Af Amer 10 (L) >60 mL/min    Comment: (NOTE) The eGFR has been calculated using the CKD EPI equation. This calculation has not been validated in all clinical situations. eGFR's persistently <60 mL/min signify possible Chronic Kidney Disease.    Anion gap 14 5 - 15  CBC with Differential     Status: Abnormal    Collection Time: 09/11/16 11:28 PM  Result Value Ref Range   WBC 12.1 (H) 4.0 - 10.5 K/uL   RBC 3.72 (L) 3.87 - 5.11 MIL/uL   Hemoglobin 12.4 12.0 - 15.0 g/dL   HCT 37.4 36.0 - 46.0 %   MCV 100.5 (H) 78.0 - 100.0 fL   MCH 33.3 26.0 - 34.0 pg   MCHC 33.2 30.0 - 36.0 g/dL   RDW 16.4 (H) 11.5 - 15.5 %   Platelets 209 150 - 400 K/uL   Neutrophils Relative % 84 %   Neutro Abs 10.2 (H) 1.7 - 7.7 K/uL   Lymphocytes Relative 10 %   Lymphs Abs 1.2 0.7 - 4.0 K/uL   Monocytes Relative 6 %   Monocytes Absolute 0.7 0.1 - 1.0 K/uL   Eosinophils Relative 0 %   Eosinophils Absolute 0.0 0.0 - 0.7 K/uL   Basophils Relative 0 %   Basophils Absolute 0.0 0.0 - 0.1 K/uL  I-Stat CG4 Lactic Acid, ED     Status: None   Collection Time: 09/11/16 11:51 PM  Result Value Ref Range   Lactic Acid, Venous 1.66 0.5 - 1.9 mmol/L  Urinalysis, Routine w reflex microscopic- may I&O cath if menses     Status: Abnormal   Collection Time: 09/12/16  1:55 AM  Result Value Ref Range   Color, Urine YELLOW YELLOW   APPearance CLOUDY (A) CLEAR   Specific Gravity, Urine 1.009 1.005 - 1.030   pH 8.0 5.0 - 8.0   Glucose, UA 150 (A) NEGATIVE mg/dL   Hgb urine dipstick SMALL (A) NEGATIVE   Bilirubin Urine NEGATIVE NEGATIVE   Ketones, ur NEGATIVE NEGATIVE mg/dL   Protein, ur >=300 (A) NEGATIVE mg/dL   Nitrite NEGATIVE NEGATIVE   Leukocytes, UA LARGE (A) NEGATIVE   RBC / HPF 6-30 0 - 5 RBC/hpf   WBC, UA TOO NUMEROUS TO COUNT 0 - 5 WBC/hpf   Bacteria, UA MANY (A) NONE SEEN   Squamous Epithelial / LPF 0-5 (A) NONE SEEN   Mucous PRESENT   I-Stat CG4 Lactic Acid, ED     Status: None   Collection Time: 09/12/16  2:34 AM  Result Value Ref Range   Lactic Acid, Venous 1.32 0.5 - 1.9 mmol/L  Basic metabolic panel     Status: Abnormal   Collection Time: 09/12/16  4:52 AM  Result Value Ref Range   Sodium 137 135 - 145 mmol/L   Potassium 4.6 3.5 - 5.1 mmol/L   Chloride 96 (L) 101 - 111 mmol/L   CO2 27 22 - 32 mmol/L    Glucose, Bld 138 (H) 65 - 99 mg/dL   BUN 31 (H) 6 - 20 mg/dL   Creatinine, Ser 5.65 (H) 0.44 - 1.00 mg/dL  Calcium 9.1 8.9 - 10.3 mg/dL   GFR calc non Af Amer 8 (L) >60 mL/min   GFR calc Af Amer 9 (L) >60 mL/min    Comment: (NOTE) The eGFR has been calculated using the CKD EPI equation. This calculation has not been validated in all clinical situations. eGFR's persistently <60 mL/min signify possible Chronic Kidney Disease.    Anion gap 14 5 - 15  CBC     Status: Abnormal   Collection Time: 09/12/16  4:52 AM  Result Value Ref Range   WBC 13.2 (H) 4.0 - 10.5 K/uL   RBC 3.57 (L) 3.87 - 5.11 MIL/uL   Hemoglobin 11.9 (L) 12.0 - 15.0 g/dL   HCT 36.7 36.0 - 46.0 %   MCV 102.8 (H) 78.0 - 100.0 fL   MCH 33.3 26.0 - 34.0 pg   MCHC 32.4 30.0 - 36.0 g/dL   RDW 16.4 (H) 11.5 - 15.5 %   Platelets 174 150 - 400 K/uL  MRSA PCR Screening     Status: None   Collection Time: 09/12/16 10:08 AM  Result Value Ref Range   MRSA by PCR NEGATIVE NEGATIVE    Comment:        The GeneXpert MRSA Assay (FDA approved for NASAL specimens only), is one component of a comprehensive MRSA colonization surveillance program. It is not intended to diagnose MRSA infection nor to guide or monitor treatment for MRSA infections.     Ct Renal Stone Study  Result Date: 09/12/2016 CLINICAL DATA:  Left flank pain beginning 3 hours ago. Patient is on dialysis with last treatment yesterday. EXAM: CT ABDOMEN AND PELVIS WITHOUT CONTRAST TECHNIQUE: Multidetector CT imaging of the abdomen and pelvis was performed following the standard protocol without IV contrast. COMPARISON:  None. FINDINGS: Lower chest: Stable cardiomegaly.  No acute pulmonary disease. Hepatobiliary: Go status post cholecystectomy. The unenhanced liver is unremarkable. Pancreas: Mild pancreatic atrophy without ductal dilatation or mass. No peripancreatic inflammation. Spleen: No splenomegaly or focal mass lesion. Adrenals/Urinary Tract: Normal appearing  adrenal glands. Perinephric fat stranding is again visualized about both kidneys more so on the left with caliectasis in dilated extrarenal pelves. Mild thickening of the renal pelvic walls and proximal ureters is again identified though not well characterized given lack of IV contrast. Findings may reflect changes of chronic urinary tract infection given the moderate diffuse transmural thickening of the bladder. No nephrolithiasis is noted. Stomach/Bowel: The stomach is nondistended. No gastric wall thickening. No evidence of bowel obstruction. There is normal small bowel rotation. No bowel obstruction is identified. There is diverticular changes along the descending colon and sigmoid without evidence of diverticulitis. Vascular/Lymphatic: There is abdominal aorto iliac atherosclerosis without aneurysm. Small lymph nodes are identified in the gastrohepatic and hepatoduodenal ligaments with small left para-aortic lymph node again noted. No pelvic sidewall lymphadenopathy. Reproductive: Hysterectomy.  No adnexal mass. Other: Diastases of the rectus muscles with slight protrusion of omental fat and large bowel as before along the lower ventral abdomen without obstruction. Musculoskeletal: No worrisome lytic or sclerotic osseous lesions. No acute osseous appearing abnormality. IMPRESSION: 1. Chronic perinephric fat stranding more so on the left without obstructive uropathy. There is mild mural thickening of the renal pelves. This in conjunction with moderate thickening of the bladder is believed to reflect changes of chronic urinary tract infection and less likely due to urothelial neoplasm given the chronicity and lack of significant change since prior as well as bilaterality of this finding. 2. Colonic diverticulosis without acute diverticulitis. 3.  Cholecystectomy.  Hysterectomy. Electronically Signed   By: Ashley Royalty M.D.   On: 09/12/2016 01:00    ROS Blood pressure (!) 101/56, pulse 78, temperature 98.3 F  (36.8 C), resp. rate (!) 21, height 5' 3.5" (1.613 m), weight 72.7 kg (160 lb 4.4 oz), SpO2 99 %. Physical Exam  Assessment/Plan:  1 - Left Mild Hydronephrosis - unclear etiology. DDX reflux, stricture, ureteral neoplasm, or just edema form infection. Would likely benefit from left ureteroscopy in elective setting after clears infectious parameters to r/o neoplasia.  2 - End Stage Renal Disease - this does not appear obstructive in etiology. Per nephrology.   3 - Possible Pyelonephritis - agree with current ABX pending further CX data. I do NOT feel any sort of procedural intervention warranted as her infectious parameters and hydro are both subtle.   We will arrange outpatient follow up in elective setting. Please call me directly with questions.    Woodrow Drab 09/12/2016, 5:05 PM

## 2016-09-12 NOTE — ED Notes (Signed)
Patient transported to CT 

## 2016-09-12 NOTE — Progress Notes (Signed)
Subjective: Patient admitted this morning, see detailed H&P by Dr Blaine Hamper  61 year old female with a history of hypertension, gastric ulcer with bleeding, anemia, left kidney abscess in February 2018, ESRD on hemodialysis Monday Wednesday Friday came to ED with left flank pain nausea and vomiting.patient had cataract surgery done yesterday. CT scan of the abdomen pelvis was concerning for pyelonephritis so patient started on IV Rocephin.  This morning patient still complains of left flank pain.  Vitals:   09/12/16 0543 09/12/16 0959  BP: (!) 191/82   Pulse: 82 82  Resp: 20 20  Temp: 99.8 F (37.7 C) 98.4 F (36.9 C)    Exam Chest is clear to auscultation bilaterally Heart S1-S2, regular no murmurs Abdomen-soft, posterior left flank tenderness  A/P Pyelonephritis Hypertension ESRD on hemodialysis  Continue IV Rocephin, vancomycin Follow urine and blood culture results Hemodialysis as per nephrology I have consulted urology as per nephrology recommendation    [facility] Pager- [phone]

## 2016-09-12 NOTE — Consult Note (Signed)
[facility] Renal Consultation Note  Indication for Consultation:  Management of ESRD/hemodialysis; anemia, hypertension/volume and secondary hyperparathyroidism  HPI: Marceil Welp is a 61 y.o. female with ESRD 2/2 DM/HTN 1st HD 06/13/16 [facility] admit , MWF OP [facility] unit,pre-diabetes (per DC summary however HA1C 4.7 06/13/16 noted Adm 06/12/2016-06/23/2016 Adm with Pyelonephritis/Perinephric abscess found to be in ARF Scr 14.57 BUN 153 1st HD 06/13/16. Dc'd on Ancef for 3 weeks . Noted was to see Urology as OP(2/3 months post dc but apt not made.  Now admitted with Pyelonephritis,presented  with left flank pain,dysuria, nausea or vomiting. CT scan showing Chronic perinephric fat stranding more so on the left without obstructive uropathy. There is mild mural thickening of the renal pelves. This in conjunction with moderate thickening of the bladder is believed to reflect changes of chronic urinary tract infection and less likely due to urothelial neoplasm given the chronicity and lack of significant change since prior as well as bilaterality of this finding,Colonic diverticulosis without acute diverticulitis and Cholecystectomy/ Hysterectomy. With admit =No diarrhea, abdominal pain, fever or chills, no chest pain, shortness of breath, cough, hematemesis or hematuria. She had left eye cataract surgery on Thursday, she is using ciprofloxacin and Ofloxacin eye drops now.As op co dysuria 5/14 with Dr. Justin Mend ordering UA  And no cs  yet back but = 3 organisms =100,000  col Grm Pos cocci, 50,000 -100,000 Gm Neg Bacilli , 50,00-100,000 with 3rd organism Grm neg bacilli.  Feels better in room now with flank pain . For hd today .      Past Medical History:  Diagnosis Date  . Anemia   . Complication of anesthesia    slow to awaken, sentive to medications rarely even takes a Tylenol.  . Diarrhea   . End-stage kidney disease ([place])   . Gastric ulcer with hemorrhage   . GERD (gastroesophageal reflux  disease)   . Hypertension   . PONV (postoperative nausea and vomiting)    after hysterectomy- 1980's  . Pre-diabetes     Past Surgical History:  Procedure Laterality Date  . ABDOMINAL HYSTERECTOMY    . BASCILIC VEIN TRANSPOSITION Left 07/15/2016   Procedure: BRACHIAL VEIN TRANSPOSITION FIRST STAGE;  Surgeon: Conrad Westminster, MD;  Location: [facility];  Service: Vascular;  Laterality: Left;  . CHOLECYSTECTOMY OPEN    . COLONOSCOPY W/ POLYPECTOMY    . EYE SURGERY Right    catarct  . IR GENERIC HISTORICAL  06/23/2016   IR US GUIDE VASC ACCESS RIGHT 06/23/2016 Arne Cleveland, MD [facility]  . IR GENERIC HISTORICAL  06/23/2016   IR FLUORO GUIDE CV LINE RIGHT 06/23/2016 Arne Cleveland, MD [facility]  . TOTAL ABDOMINAL HYSTERECTOMY W/ BILATERAL SALPINGOOPHORECTOMY    . TRABECULECTOMY Bilateral       Family History  Problem Relation Age of Onset  . Hypertension Mother       reports that she quit smoking about 21 years ago. Her smoking use included Cigars. She has never used smokeless tobacco. She reports that she does not drink alcohol or use drugs.   Allergies  Allergen Reactions  . Strawberry Extract Swelling  . Nsaids Other (See Comments)    Has a healing stomach ulcer  . Clonidine Nausea And Vomiting  . Penicillins Rash and Other (See Comments)    Rash and rib pain. Has patient had a PCN reaction causing immediate rash, facial/tongue/throat swelling, SOB or lightheadedness with hypotension: No Has patient had a PCN reaction causing severe rash involving mucus  membranes or skin necrosis: No Has patient had a PCN reaction that required hospitalization: No Has patient had a PCN reaction occurring within the last 10 years: No If all of the above answers are "NO", then may proceed with Cephalosporin use.   . Sulfa Antibiotics Rash and Other (See Comments)    Rib pain    Prior to Admission medications   Medication Sig Start Date End Date Taking? Authorizing Provider  amLODipine  (NORVASC) 10 MG tablet Take 10 mg by mouth every other day.  09/09/15 09/12/16 Yes [provider]  ciprofloxacin (CILOXAN) 0.3 % ophthalmic solution Place 1 drop into the left eye 4 (four) times daily. 08/07/16  Yes [provider]  famotidine (PEPCID) 20 MG tablet Take 1 tablet (20 mg total) by mouth at bedtime. 06/23/16  Yes Domenic Polite, MD  hydrALAZINE (APRESOLINE) 25 MG tablet Take 1 tablet (25 mg total) by mouth every 8 (eight) hours. Patient taking differently: Take 25 mg by mouth every 8 (eight) hours. On dialysis days MWF, takes 23m in the AM and PM only. 06/23/16  Yes JDomenic Polite MD  labetalol (NORMODYNE) 300 MG tablet Take 1 tablet (300 mg total) by mouth 2 (two) times daily. Patient taking differently: Take 300 mg by mouth 2 (two) times daily. On dialysis days MWF, takes 1561min the AM and 30071mn the PM. 06/23/16  Yes JosDomenic PoliteD  ofloxacin (OCUFLOX) 0.3 % ophthalmic solution Place 1 drop into the left eye 4 (four) times daily. 09/10/16  Yes [provider]  oxyCODONE-acetaminophen (PERCOCET/ROXICET) 5-325 MG tablet Take 1 tablet by mouth every 6 (six) hours as needed. Patient not taking: Reported on 09/05/2016 07/15/16   ColUlyses AmorA-C     Anti-infectives    Start     Dose/Rate Route Frequency Ordered Stop   09/13/16 0600  cefTRIAXone (ROCEPHIN) 1 g in dextrose 5 % 50 mL IVPB     1 g 100 mL/hr over 30 Minutes Intravenous Every 24 hours 09/12/16 0443     09/13/16 0200  cefTRIAXone (ROCEPHIN) 1 g in dextrose 5 % 50 mL IVPB  Status:  Discontinued     1 g 100 mL/hr over 30 Minutes Intravenous  Once 09/12/16 0424 09/12/16 0443   09/12/16 1200  vancomycin (VANCOCIN) IVPB 750 mg/150 ml premix     750 mg 150 mL/hr over 60 Minutes Intravenous Every M-W-F (Hemodialysis) 09/12/16 0441     09/12/16 0445  vancomycin (VANCOCIN) 1,500 mg in sodium chloride 0.9 % 500 mL IVPB     1,500 mg 250 mL/hr over 120 Minutes Intravenous STAT 09/12/16 0441  09/12/16 0750   09/12/16 0330  cefTRIAXone (ROCEPHIN) 1 g in dextrose 5 % 50 mL IVPB  Status:  Discontinued     1 g 100 mL/hr over 30 Minutes Intravenous  Once 09/12/16 0324 09/12/16 0620   09/12/16 0245  cefTRIAXone (ROCEPHIN) 1 g in dextrose 5 % 50 mL IVPB     1 g 100 mL/hr over 30 Minutes Intravenous  Once 09/12/16 0240 09/12/16 0318      Results for orders placed or performed during the hospital encounter of 09/11/16 (from the past 48 hour(s))  Basic metabolic panel     Status: Abnormal   Collection Time: 09/11/16 11:22 PM  Result Value Ref Range   Sodium 138 135 - 145 mmol/L   Potassium 4.1 3.5 - 5.1 mmol/L   Chloride 98 (L) 101 - 111 mmol/L   CO2 26 22 - 32  mmol/L   Glucose, Bld 118 (H) 65 - 99 mg/dL   BUN 29 (H) 6 - 20 mg/dL   Creatinine, Ser 5.24 (H) 0.44 - 1.00 mg/dL   Calcium 9.3 8.9 - 10.3 mg/dL   GFR calc non Af Amer 8 (L) >60 mL/min   GFR calc Af Amer 10 (L) >60 mL/min    Comment: (NOTE) The eGFR has been calculated using the CKD EPI equation. This calculation has not been validated in all clinical situations. eGFR's persistently <60 mL/min signify possible Chronic Kidney Disease.    Anion gap 14 5 - 15  CBC with Differential     Status: Abnormal   Collection Time: 09/11/16 11:28 PM  Result Value Ref Range   WBC 12.1 (H) 4.0 - 10.5 K/uL   RBC 3.72 (L) 3.87 - 5.11 MIL/uL   Hemoglobin 12.4 12.0 - 15.0 g/dL   HCT 37.4 36.0 - 46.0 %   MCV 100.5 (H) 78.0 - 100.0 fL   MCH 33.3 26.0 - 34.0 pg   MCHC 33.2 30.0 - 36.0 g/dL   RDW 16.4 (H) 11.5 - 15.5 %   Platelets 209 150 - 400 K/uL   Neutrophils Relative % 84 %   Neutro Abs 10.2 (H) 1.7 - 7.7 K/uL   Lymphocytes Relative 10 %   Lymphs Abs 1.2 0.7 - 4.0 K/uL   Monocytes Relative 6 %   Monocytes Absolute 0.7 0.1 - 1.0 K/uL   Eosinophils Relative 0 %   Eosinophils Absolute 0.0 0.0 - 0.7 K/uL   Basophils Relative 0 %   Basophils Absolute 0.0 0.0 - 0.1 K/uL  I-Stat CG4 Lactic Acid, ED     Status: None   Collection  Time: 09/11/16 11:51 PM  Result Value Ref Range   Lactic Acid, Venous 1.66 0.5 - 1.9 mmol/L  Urinalysis, Routine w reflex microscopic- may I&O cath if menses     Status: Abnormal   Collection Time: 09/12/16  1:55 AM  Result Value Ref Range   Color, Urine YELLOW YELLOW   APPearance CLOUDY (A) CLEAR   Specific Gravity, Urine 1.009 1.005 - 1.030   pH 8.0 5.0 - 8.0   Glucose, UA 150 (A) NEGATIVE mg/dL   Hgb urine dipstick SMALL (A) NEGATIVE   Bilirubin Urine NEGATIVE NEGATIVE   Ketones, ur NEGATIVE NEGATIVE mg/dL   Protein, ur >=300 (A) NEGATIVE mg/dL   Nitrite NEGATIVE NEGATIVE   Leukocytes, UA LARGE (A) NEGATIVE   RBC / HPF 6-30 0 - 5 RBC/hpf   WBC, UA TOO NUMEROUS TO COUNT 0 - 5 WBC/hpf   Bacteria, UA MANY (A) NONE SEEN   Squamous Epithelial / LPF 0-5 (A) NONE SEEN   Mucous PRESENT   I-Stat CG4 Lactic Acid, ED     Status: None   Collection Time: 09/12/16  2:34 AM  Result Value Ref Range   Lactic Acid, Venous 1.32 0.5 - 1.9 mmol/L  Basic metabolic panel     Status: Abnormal   Collection Time: 09/12/16  4:52 AM  Result Value Ref Range   Sodium 137 135 - 145 mmol/L   Potassium 4.6 3.5 - 5.1 mmol/L   Chloride 96 (L) 101 - 111 mmol/L   CO2 27 22 - 32 mmol/L   Glucose, Bld 138 (H) 65 - 99 mg/dL   BUN 31 (H) 6 - 20 mg/dL   Creatinine, Ser 5.65 (H) 0.44 - 1.00 mg/dL   Calcium 9.1 8.9 - 10.3 mg/dL   GFR calc non Af Amer 8 (  L) >60 mL/min   GFR calc Af Amer 9 (L) >60 mL/min    Comment: (NOTE) The eGFR has been calculated using the CKD EPI equation. This calculation has not been validated in all clinical situations. eGFR's persistently <60 mL/min signify possible Chronic Kidney Disease.    Anion gap 14 5 - 15  CBC     Status: Abnormal   Collection Time: 09/12/16  4:52 AM  Result Value Ref Range   WBC 13.2 (H) 4.0 - 10.5 K/uL   RBC 3.57 (L) 3.87 - 5.11 MIL/uL   Hemoglobin 11.9 (L) 12.0 - 15.0 g/dL   HCT 36.7 36.0 - 46.0 %   MCV 102.8 (H) 78.0 - 100.0 fL   MCH 33.3 26.0 - 34.0  pg   MCHC 32.4 30.0 - 36.0 g/dL   RDW 16.4 (H) 11.5 - 15.5 %   Platelets 174 150 - 400 K/uL    ROS: see hpi  Physical Exam: Vitals:   09/12/16 0516 09/12/16 0543  BP:  (!) 191/82  Pulse:  82  Resp:  20  Temp: 98.7 F (37.1 C) 99.8 F (37.7 C)     General: Adult WF sleeping awoken NAD , OX3  HEENT: [place] , MM dry, L eye Patch on  Neck: Supple, no jvd Heart: RRR  1/6 sem , no rub or galop Lungs: CTA  Abdomen: Obese, BS pos, soft , NT, ND Extremities: no pedal edema Skin:  No overt rash or pedal ulceras Neuro: Alert moves all etrem , Sleepy  But appropriate  Dialysis Access: R IJ Perm cath , L UA AVF  + bruit   Dialysis Orders: Center: [facility] on MWF . EDW 75 HD Bath 2k,  2.25Ca  Time 4hr Heparin none. Access R IJ Perm cath  / L BV AVF (1st stage  Dr. Bridgett Larsson 07/15/16 )     Mircera 75 mcg q 2 wks (last on 09/10/16)   Venofer loading dose (started 5/02- 09/17/16 )  Other  Calcium carbonate 1 ac as binder ( phos stable as out pt and PTH <200)  Assessment/Plan 1. ESRD -  HD MWF schedule  Continue  HD  Today ,no hep recent cataract surg .  2. Pyelonephritis-  Antibiotics per admit/ Ho recurrent with Last admit Feb 2108 ( ho Feb  Ab- consider urology involvement as kidneys are not functional and have recurrent infection- maybe she needs a nephrectomy 3. Hypertension/volume  - wt at edw today , htn  No pedal edema,  bp up  Continue meds Labetalol 300 mg bid/ Hydralazine 25 mg  q 8 hrs   and Amlodipine 10 mg qod - BP is low currently ? But high on floor  4. Anemia- Hgb 11.9 , Mircera given last HD 5/16 no need this week, monitor hgb , < hold Venofer load ( as on as OP ) With Infection  5. Metabolic bone disease -  Ca , Phos stable as op , no vit d , was on Ca carb 1 ac/ ca 9.1  Fu phos  Hold binder for now  6. Nutrition - Renal /carb mod diet , renal vit  7.   Dm type 2- per admit  8   Sp L Cataract- Per admit/ no hep hd today  Ernest Haber, PA-C [place]  [phone] 09/12/2016, 9:18 AM   Patient seen and examined, agree with above note with above modifications. Is known to me- seen on HD- somnolent but arousable- no pain complaint.  BP which  was high now low ?  Recurrent pyelo- as above needs urology involvement at some point- may be candidate for nephrectomy (s) so this wont happen again Corliss Parish, MD 09/12/2016

## 2016-09-12 NOTE — ED Notes (Signed)
Verified with Dr. Roxanne Mins that he did not wan't patient to receive second gram of rocephin.  DC'd at this time.

## 2016-09-12 NOTE — Procedures (Signed)
Patient was seen on dialysis and the procedure was supervised.  BFR 400  Via PC BP is  115/62.   Patient appears to be tolerating treatment well  Alison Baker A 09/12/2016

## 2016-09-13 DIAGNOSIS — D631 Anemia in chronic kidney disease: Secondary | ICD-10-CM

## 2016-09-13 LAB — BASIC METABOLIC PANEL
ANION GAP: 11 (ref 5–15)
BUN: 17 mg/dL (ref 6–20)
CHLORIDE: 94 mmol/L — AB (ref 101–111)
CO2: 26 mmol/L (ref 22–32)
Calcium: 8.3 mg/dL — ABNORMAL LOW (ref 8.9–10.3)
Creatinine, Ser: 3.67 mg/dL — ABNORMAL HIGH (ref 0.44–1.00)
GFR calc non Af Amer: 13 mL/min — ABNORMAL LOW (ref >60)
GFR, EST AFRICAN AMERICAN: 15 mL/min — AB (ref >60)
Glucose, Bld: 103 mg/dL — ABNORMAL HIGH (ref 65–99)
POTASSIUM: 3.7 mmol/L (ref 3.5–5.1)
Sodium: 131 mmol/L — ABNORMAL LOW (ref 135–145)

## 2016-09-13 LAB — CBC
HEMATOCRIT: 35 % — AB (ref 36.0–46.0)
HEMOGLOBIN: 11 g/dL — AB (ref 12.0–15.0)
MCH: 32.7 pg (ref 26.0–34.0)
MCHC: 31.4 g/dL (ref 30.0–36.0)
MCV: 104.2 fL — ABNORMAL HIGH (ref 78.0–100.0)
Platelets: 195 10*3/uL (ref 150–400)
RBC: 3.36 MIL/uL — AB (ref 3.87–5.11)
RDW: 17.1 % — ABNORMAL HIGH (ref 11.5–15.5)
WBC: 13.2 10*3/uL — AB (ref 4.0–10.5)

## 2016-09-13 LAB — VANCOMYCIN, RANDOM: VANCOMYCIN RM: 25

## 2016-09-13 LAB — GLUCOSE, CAPILLARY: Glucose-Capillary: 179 mg/dL — ABNORMAL HIGH (ref 65–99)

## 2016-09-13 MED ORDER — HYDRALAZINE HCL 10 MG PO TABS: 10.0000 mg | ORAL_TABLET | ORAL | Status: DC

## 2016-09-13 NOTE — Progress Notes (Signed)
Subjective:  Feels better, Tolerated HD  yest . Noted Urology Dr Tresa Moore "" will arrange outpatient follow up in elective setting/ Would likely benefit from left ureteroscopy in elective setting after clears infectious parameters to r/o neoplasia.""  Objective Vital signs in last 24 hours: Vitals:   09/12/16 1824 09/12/16 2141 09/13/16 0508 09/13/16 0917  BP: (!) 107/47 (!) 136/54 (!) 121/50 128/65  Pulse: 73 79 65 78  Resp: 18 18 16 16   Temp: 99.6 F (37.6 C) (!) 101.4 F (38.6 C) 98.4 F (36.9 C) 98 F (36.7 C)  TempSrc: Oral Oral Oral Oral  SpO2: 95% 98% 98% 98%  Weight:   71.9 kg (158 lb 8.2 oz)   Height:       Weight change: -0.783 kg (-1 lb 11.6 oz)  Physical Exam: General: Adult WF, NAD , OX3 , eating brk / L eye patch off now ("on only when sleeping sp Cataract surg 17th ") Heart: RRR  1/6 sem , no rub or galop Lungs: CTA  Abdomen: Obese, BS pos, soft , NT, ND Extremities: no pedal edema Dialysis Access: R IJ Perm cath , L UA AVF  + bruit   Dialysis Orders: Center: [facility] on MWF . EDW 75 HD Bath 2k,  2.25Ca  Time 4hr Heparin none. Access R IJ Perm cath  / L BV AVF (1st stage  Dr. Bridgett Larsson 07/15/16 )     Mircera 75 mcg q 2 wks (last on 09/10/16)   Venofer loading dose (started 5/02- 09/17/16 )  Other  Calcium carbonate 1 ac as binder ( phos stable as out pt and PTH <200)  Assessment: 1. Pyelonephritis-  Antibiotics per admit/ Ho recurrent with Last admit Feb 2108 ( ho Feb 2018 Admit also similar episode) OP UA CS 09/08/16 == Streptococcus agalactiae ( beta hemolytic)  and E. Coli ( placed in paper chart ). Also has L hydro per urology notes and they plan on doing ureteroscopy some time later after recovers from infection.  2. ESRD -  HD MWF, no hep because recent cataract surg 3. Hypertension/volume  - , htn  On admit/ Hd yest now below edw bp improved and bp dropped on HD /taper back  bp  meds =Labetalol 300 mg bid/ Hydralazine 25 mg  q 8 hrs=decrease to 10 mg BID / Amlodipine 10 mg  qod =will dc-  4. Anemia- Hgb 11.9 ,>11.0  Mircera given last HD 5/16 no need this week, monitor hgb , < hold Venofer load ( as on as OP ) With Infection  5. Metabolic bone disease -  Ca , Phos stable as op , no vit d , was on Ca carb 1 ac/ ca 9.1  Fu phos  Hold binder for now  6. Nutrition - Renal /carb mod diet , renal vit  7.   Dm type 2- per admit  8   Sp  Recent L Cataract- Per admit/  Ernest Haber, PA-C [place] [phone] 09/13/2016,9:20 AM  LOS: 1 day   Pt seen, examined, agree w assess/plan as above with additions as indicated.  Kelly Splinter MD [facility] pager [phone]    cell [phone] 09/13/2016, 12:16 PM     Labs: Basic Metabolic Panel:  Recent Labs Lab 09/11/16 2322 09/12/16 0452 09/13/16 0433  NA 138 137 131*  K 4.1 4.6 3.7  CL 98* 96* 94*  CO2 26 27 26   GLUCOSE 118* 138* 103*  BUN 29* 31* 17  CREATININE 5.24* 5.65* 3.67*  CALCIUM 9.3 9.1 8.3*   Liver Function Tests: No results for input(s): AST, ALT, ALKPHOS, BILITOT, PROT, ALBUMIN in the last 168 hours. No results for input(s): LIPASE, AMYLASE in the last 168 hours. No results for input(s): AMMONIA in the last 168 hours. CBC:  Recent Labs Lab 09/11/16 2328 09/12/16 0452 09/13/16 0433  WBC 12.1* 13.2* 13.2*  NEUTROABS 10.2*  --   --   HGB 12.4 11.9* 11.0*  HCT 37.4 36.7 35.0*  MCV 100.5* 102.8* 104.2*  PLT 209 174 195   Cardiac Enzymes: No results for input(s): CKTOTAL, CKMB, CKMBINDEX, TROPONINI in the last 168 hours. CBG:  Recent Labs Lab 09/13/16 0801  GLUCAP 179*    Studies/Results: Ct Renal Stone Study  Result Date: 09/12/2016 CLINICAL DATA:  Left flank pain beginning 3 hours ago. Patient is on dialysis with last treatment yesterday. EXAM: CT ABDOMEN AND PELVIS WITHOUT CONTRAST TECHNIQUE: Multidetector CT imaging of the abdomen and pelvis was performed following the standard protocol without IV contrast. COMPARISON:  None. FINDINGS:  Lower chest: Stable cardiomegaly.  No acute pulmonary disease. Hepatobiliary: Go status post cholecystectomy. The unenhanced liver is unremarkable. Pancreas: Mild pancreatic atrophy without ductal dilatation or mass. No peripancreatic inflammation. Spleen: No splenomegaly or focal mass lesion. Adrenals/Urinary Tract: Normal appearing adrenal glands. Perinephric fat stranding is again visualized about both kidneys more so on the left with caliectasis in dilated extrarenal pelves. Mild thickening of the renal pelvic walls and proximal ureters is again identified though not well characterized given lack of IV contrast. Findings may reflect changes of chronic urinary tract infection given the moderate diffuse transmural thickening of the bladder. No nephrolithiasis is noted. Stomach/Bowel: The stomach is nondistended. No gastric wall thickening. No evidence of bowel obstruction. There is normal small bowel rotation. No bowel obstruction is identified. There is diverticular changes along the descending colon and sigmoid without evidence of diverticulitis. Vascular/Lymphatic: There is abdominal aorto iliac atherosclerosis without aneurysm. Small lymph nodes are identified in the gastrohepatic and hepatoduodenal ligaments with small left para-aortic lymph node again noted. No pelvic sidewall lymphadenopathy. Reproductive: Hysterectomy.  No adnexal mass. Other: Diastases of the rectus muscles with slight protrusion of omental fat and large bowel as before along the lower ventral abdomen without obstruction. Musculoskeletal: No worrisome lytic or sclerotic osseous lesions. No acute osseous appearing abnormality. IMPRESSION: 1. Chronic perinephric fat stranding more so on the left without obstructive uropathy. There is mild mural thickening of the renal pelves. This in conjunction with moderate thickening of the bladder is believed to reflect changes of chronic urinary tract infection and less likely due to urothelial  neoplasm given the chronicity and lack of significant change since prior as well as bilaterality of this finding. 2. Colonic diverticulosis without acute diverticulitis. 3. Cholecystectomy.  Hysterectomy. Electronically Signed   By: Ashley Royalty M.D.   On: 09/12/2016 01:00   Medications: . cefTRIAXone (ROCEPHIN)  IV 1 g (09/13/16 0627)  . vancomycin     . amLODipine  10 mg Oral QODAY  . Difluprednate  1 drop Ophthalmic BID   Followed by  . [START ON 09/16/2016] Difluprednate  1 drop Ophthalmic Daily  . famotidine  20 mg Oral QHS  . feeding supplement (NEPRO CARB STEADY)  237 mL Oral BID BM  . heparin  5,000 Units Subcutaneous Q8H  . hydrALAZINE  25 mg Oral 3 times per day on Sun Tue Thu Sat  . hydrALAZINE  25 mg Oral 2 times per day on Mon Wed Fri  .  labetalol  150 mg Oral Q M,W,F  . labetalol  300 mg Oral QHS  . labetalol  300 mg Oral Q T,Th,S,Su  . nepafenac  1 drop Left Eye BID  . ofloxacin  1 drop Left Eye BID

## 2016-09-13 NOTE — Progress Notes (Signed)
[facility]  PROGRESS NOTE  Alison Baker OZH:[id number] DOB: December 17, 1955 DOA: 09/11/2016 PCP: Melony Overly, MD   Brief HPI:    61 year old female with a history of hypertension, gastric ulcer with bleeding, anemia, left kidney abscess in February 2018, ESRD on hemodialysis Monday Wednesday Friday came to ED with left flank pain nausea and vomiting.patient had cataract surgery done yesterday. CT scan of the abdomen pelvis was concerning for pyelonephritis so patient started on IV Rocephin.   Subjective   This morning patient denies abdominal pain. No flank pain   Assessment/Plan:     1. Recurrent pyelonephritis- urine cultures growing greater than 100,000 colonies per mL of Escherichia coli, continue ceftriaxone. We will await final culture results. Blood cultures are negative to date. Will discontinue vancomycin. 2. Left mild hydronephrosis- urology was consulted, recommended ureteroscopy as outpatient to rule out neoplasia. 3. End-stage renal disease- patient currently on hemodialysis Monday Wednesday Friday per nephrology. 4. Chronic anemia- hemoglobin is 11.0, management per nephrology 5. Recent cataract surgery- patient had recent cataract surgery on 09/11/2016, continue ophthalmic eyedrops including Difluprednate, ofloxacin, Levro. 6. Hypertension-blood pressure stable, continue hydralazine, labetalol.    DVT prophylaxis: Heparin, pharmacy was concerned that patient had recent cataract surgery. I called ophthalmologist on call today Dr. Arlina Robes and confirmed with her that patient is okay to have heparin as DVT prophylaxis.  Code Status: Full code  Family Communication: Discussed with patient's husband at bedside   Disposition Plan: Likely home in 1-2 days after urine culture is resulted   Consultants: Urology, nephrology   Procedures None  Continuous infusions . cefTRIAXone (ROCEPHIN)  IV 1 g (09/13/16 0627)  . vancomycin        Antibiotics:    Anti-infectives    Start     Dose/Rate Route Frequency Ordered Stop   09/13/16 0600  cefTRIAXone (ROCEPHIN) 1 g in dextrose 5 % 50 mL IVPB     1 g 100 mL/hr over 30 Minutes Intravenous Every 24 hours 09/12/16 0443     09/13/16 0200  cefTRIAXone (ROCEPHIN) 1 g in dextrose 5 % 50 mL IVPB  Status:  Discontinued     1 g 100 mL/hr over 30 Minutes Intravenous  Once 09/12/16 0424 09/12/16 0443   09/12/16 1200  vancomycin (VANCOCIN) IVPB 750 mg/150 ml premix     750 mg 150 mL/hr over 60 Minutes Intravenous Every M-W-F (Hemodialysis) 09/12/16 0441     09/12/16 0445  vancomycin (VANCOCIN) 1,500 mg in sodium chloride 0.9 % 500 mL IVPB     1,500 mg 250 mL/hr over 120 Minutes Intravenous STAT 09/12/16 0441 09/12/16 0750   09/12/16 0330  cefTRIAXone (ROCEPHIN) 1 g in dextrose 5 % 50 mL IVPB  Status:  Discontinued     1 g 100 mL/hr over 30 Minutes Intravenous  Once 09/12/16 0324 09/12/16 0620   09/12/16 0245  cefTRIAXone (ROCEPHIN) 1 g in dextrose 5 % 50 mL IVPB     1 g 100 mL/hr over 30 Minutes Intravenous  Once 09/12/16 0240 09/12/16 0318       Objective   Vitals:   09/12/16 1824 09/12/16 2141 09/13/16 0508 09/13/16 0917  BP: (!) 107/47 (!) 136/54 (!) 121/50 128/65  Pulse: 73 79 65 78  Resp: 18 18 16 16   Temp: 99.6 F (37.6 C) (!) 101.4 F (38.6 C) 98.4 F (36.9 C) 98 F (36.7 C)  TempSrc: Oral Oral Oral Oral  SpO2: 95% 98% 98% 98%  Weight:   71.9 kg (158 lb 8.2  oz)   Height:        Intake/Output Summary (Last 24 hours) at 09/13/16 1314 Last data filed at 09/13/16 0945  Gross per 24 hour  Intake              480 ml  Output              557 ml  Net              -77 ml   Filed Weights   09/12/16 1300 09/12/16 1717 09/13/16 0508  Weight: 72.7 kg (160 lb 4.4 oz) 71.5 kg (157 lb 10.1 oz) 71.9 kg (158 lb 8.2 oz)     Physical Examination:   Physical Exam: Eyes: No icterus, extraocular muscles intact  Mouth: Oral mucosa is moist, no lesions on palate,  Neck: Supple, no  deformities, masses, or tenderness Lungs: Normal respiratory effort, bilateral clear to auscultation, no crackles or wheezes.  Heart: Regular rate and rhythm, S1 and S2 normal, no murmurs, rubs auscultated Abdomen: BS normoactive,soft,nondistended,non-tender to palpation,no organomegaly Extremities: No pretibial edema, no erythema, no cyanosis, no clubbing Neuro : Alert and oriented to time, place and person, No focal deficits  Skin: No rashes seen on exam     Data Reviewed: I have personally reviewed following labs and imaging studies  CBG:  Recent Labs Lab 09/13/16 0801  GLUCAP 179*    CBC:  Recent Labs Lab 09/11/16 2328 09/12/16 0452 09/13/16 0433  WBC 12.1* 13.2* 13.2*  NEUTROABS 10.2*  --   --   HGB 12.4 11.9* 11.0*  HCT 37.4 36.7 35.0*  MCV 100.5* 102.8* 104.2*  PLT 209 174 027    Basic Metabolic Panel:  Recent Labs Lab 09/11/16 2322 09/12/16 0452 09/13/16 0433  NA 138 137 131*  K 4.1 4.6 3.7  CL 98* 96* 94*  CO2 26 27 26   GLUCOSE 118* 138* 103*  BUN 29* 31* 17  CREATININE 5.24* 5.65* 3.67*  CALCIUM 9.3 9.1 8.3*    Recent Results (from the past 240 hour(s))  Urine culture     Status: Abnormal (Preliminary result)   Collection Time: 09/12/16  1:55 AM  Result Value Ref Range Status   Specimen Description URINE, CLEAN CATCH  Final   Special Requests NONE  Final   Culture >=100,000 COLONIES/mL ESCHERICHIA COLI (A)  Final   Report Status PENDING  Incomplete  MRSA PCR Screening     Status: None   Collection Time: 09/12/16 10:08 AM  Result Value Ref Range Status   MRSA by PCR NEGATIVE NEGATIVE Final    Comment:        The GeneXpert MRSA Assay (FDA approved for NASAL specimens only), is one component of a comprehensive MRSA colonization surveillance program. It is not intended to diagnose MRSA infection nor to guide or monitor treatment for MRSA infections.      Liver Function Tests: No results for input(s): AST, ALT, ALKPHOS, BILITOT,  PROT, ALBUMIN in the last 168 hours. No results for input(s): LIPASE, AMYLASE in the last 168 hours. No results for input(s): AMMONIA in the last 168 hours.  Cardiac Enzymes: No results for input(s): CKTOTAL, CKMB, CKMBINDEX, TROPONINI in the last 168 hours. BNP (last 3 results) No results for input(s): BNP in the last 8760 hours.  ProBNP (last 3 results) No results for input(s): PROBNP in the last 8760 hours.    Studies: Ct Renal Stone Study  Result Date: 09/12/2016 CLINICAL DATA:  Left flank pain beginning 3 hours ago. Patient  is on dialysis with last treatment yesterday. EXAM: CT ABDOMEN AND PELVIS WITHOUT CONTRAST TECHNIQUE: Multidetector CT imaging of the abdomen and pelvis was performed following the standard protocol without IV contrast. COMPARISON:  None. FINDINGS: Lower chest: Stable cardiomegaly.  No acute pulmonary disease. Hepatobiliary: Go status post cholecystectomy. The unenhanced liver is unremarkable. Pancreas: Mild pancreatic atrophy without ductal dilatation or mass. No peripancreatic inflammation. Spleen: No splenomegaly or focal mass lesion. Adrenals/Urinary Tract: Normal appearing adrenal glands. Perinephric fat stranding is again visualized about both kidneys more so on the left with caliectasis in dilated extrarenal pelves. Mild thickening of the renal pelvic walls and proximal ureters is again identified though not well characterized given lack of IV contrast. Findings may reflect changes of chronic urinary tract infection given the moderate diffuse transmural thickening of the bladder. No nephrolithiasis is noted. Stomach/Bowel: The stomach is nondistended. No gastric wall thickening. No evidence of bowel obstruction. There is normal small bowel rotation. No bowel obstruction is identified. There is diverticular changes along the descending colon and sigmoid without evidence of diverticulitis. Vascular/Lymphatic: There is abdominal aorto iliac atherosclerosis without  aneurysm. Small lymph nodes are identified in the gastrohepatic and hepatoduodenal ligaments with small left para-aortic lymph node again noted. No pelvic sidewall lymphadenopathy. Reproductive: Hysterectomy.  No adnexal mass. Other: Diastases of the rectus muscles with slight protrusion of omental fat and large bowel as before along the lower ventral abdomen without obstruction. Musculoskeletal: No worrisome lytic or sclerotic osseous lesions. No acute osseous appearing abnormality. IMPRESSION: 1. Chronic perinephric fat stranding more so on the left without obstructive uropathy. There is mild mural thickening of the renal pelves. This in conjunction with moderate thickening of the bladder is believed to reflect changes of chronic urinary tract infection and less likely due to urothelial neoplasm given the chronicity and lack of significant change since prior as well as bilaterality of this finding. 2. Colonic diverticulosis without acute diverticulitis. 3. Cholecystectomy.  Hysterectomy. Electronically Signed   By: Ashley Royalty M.D.   On: 09/12/2016 01:00    Scheduled Meds: . Difluprednate  1 drop Ophthalmic BID   Followed by  . [START ON 09/16/2016] Difluprednate  1 drop Ophthalmic Daily  . famotidine  20 mg Oral QHS  . feeding supplement (NEPRO CARB STEADY)  237 mL Oral BID BM  . heparin  5,000 Units Subcutaneous Q8H  . [START ON 09/15/2016] hydrALAZINE  10 mg Oral 2 times per day on Mon Wed Fri  . hydrALAZINE  25 mg Oral 3 times per day on Sun Tue Thu Sat  . labetalol  150 mg Oral Q M,W,F  . labetalol  300 mg Oral QHS  . labetalol  300 mg Oral Q T,Th,S,Su  . nepafenac  1 drop Left Eye BID  . ofloxacin  1 drop Left Eye BID      Time spent: 25 min  [facility] Pager [phone]. If 7PM-7AM, please contact night-coverage at [email or web address], Office  [phone]  password [email or web address] 09/13/2016, 1:14 PM  LOS: 1 day

## 2016-09-13 NOTE — Progress Notes (Signed)
Pharmacy Antibiotic Note  Alison Baker is a 61 y.o. female admitted on 09/11/2016 with pyelonephritis and hx of left kidney abscess.  Pharmacy has been consulted for Vancomycin dosing. Pt also on Rocephin per MD. Pt with ESRD - HD on M/W/F. Tmax 101.4 and WBC elevated at 13.2. Random vancomycin level collected as HD dose on 5/18 was not charted as given. Vancomycin random is 25 mcg/mL suggesting that dose was given after HD.   Plan: Continue vancomycin 750mg  IV qHD  Will f/u micro data, renal function, and pt's clinical condition Vanc pre-HD level prn CTX per MD   Height: 5' 3.5" (161.3 cm) Weight: 158 lb 8.2 oz (71.9 kg) IBW/kg (Calculated) : 53.55  Temp (24hrs), Avg:99 F (37.2 C), Min:98 F (36.7 C), Max:101.4 F (38.6 C)   Recent Labs Lab 09/11/16 2322 09/11/16 2328 09/11/16 2351 09/12/16 0234 09/12/16 0452 09/13/16 0433 09/13/16 1100  WBC  --  12.1*  --   --  13.2* 13.2*  --   CREATININE 5.24*  --   --   --  5.65* 3.67*  --   LATICACIDVEN  --   --  1.66 1.32  --   --   --   VANCORANDOM  --   --   --   --   --   --  25    Estimated Creatinine Clearance: 16.1 mL/min (A) (by C-G formula based on SCr of 3.67 mg/dL (H)).    Allergies  Allergen Reactions  . Strawberry Extract Swelling  . Nsaids Other (See Comments)    Has a healing stomach ulcer  . Clonidine Nausea And Vomiting  . Penicillins Rash and Other (See Comments)    Rash and rib pain. Has patient had a PCN reaction causing immediate rash, facial/tongue/throat swelling, SOB or lightheadedness with hypotension: No Has patient had a PCN reaction causing severe rash involving mucus membranes or skin necrosis: No Has patient had a PCN reaction that required hospitalization: No Has patient had a PCN reaction occurring within the last 10 years: No If all of the above answers are "NO", then may proceed with Cephalosporin use.   . Sulfa Antibiotics Rash and Other (See Comments)    Rib pain    Antimicrobials this  admission: Rocephin 5/18 >>  Vanc 5/18 >>  * 1500 mg LD given on 5/18 @ 0500 * VR 5/19 = 25   Microbiology results: 5/18 BCx x2 >>  5/18 UCx >>>100,000 ecoli  5/18 MRSA PCR > neg   Argie Ramming, PharmD Pharmacy Resident  Pager [phone] 09/13/16 12:18 PM

## 2016-09-14 LAB — URINE CULTURE

## 2016-09-14 MED ORDER — CEPHALEXIN 500 MG PO CAPS: 500.0000 mg | ORAL_CAPSULE | Freq: Every day | ORAL | Status: DC

## 2016-09-14 MED ORDER — CEPHALEXIN 500 MG PO CAPS: 500.0000 mg | ORAL_CAPSULE | Freq: Every day | ORAL | 0 refills | Status: DC

## 2016-09-14 NOTE — Progress Notes (Signed)
Pt discharge to home, discharge instructions, medications and follow up appointments discussed and reviewed with pt, verbalized understanding. IV discontinued, catheter intact, pressure applied to site x1 minute. Pt was escorted out of the unit in wheelchair. Took all belongings with her.

## 2016-09-14 NOTE — Discharge Summary (Signed)
Physician Discharge Summary  Alison Baker GYI:[id number] DOB: March 21, 1956 DOA: 09/11/2016  PCP: Alison Overly, MD  Admit date: 09/11/2016 Discharge date: 09/14/2016  Time spent: 25* minutes  Recommendations for Outpatient Follow-up:  1. Follow up urology as outpatient 2. Follow up PCP in 2 weeks   Discharge Diagnoses:  Principal Problem:   Pyelonephritis Active Problems:   Essential hypertension   GERD (gastroesophageal reflux disease)   ESRD on dialysis [facility])   Discharge Condition: Stable  Diet recommendation: low salt diet  Filed Weights   09/12/16 1717 09/13/16 0508 09/13/16 2200  Weight: 71.5 kg (157 lb 10.1 oz) 71.9 kg (158 lb 8.2 oz) 72.7 kg (160 lb 4.4 oz)    History of present illness:  61 year old female with a history of hypertension, gastric ulcer with bleeding, anemia, left kidney abscess in February 2018, ESRD on hemodialysis Monday Wednesday Friday came to ED with left flank pain nausea and vomiting.patient had cataract surgery done yesterday. CT scan of the abdomen pelvis was concerning for pyelonephritis so patient started on IV Rocephin  Hospital Course:  1. Recurrent pyelonephritis- Improved, patient is now afebrile. Urine cultures growing greater than 100,000 colonies per mL of Escherichia coli, sensitive to Ancef, ceftriaxone. Patient was started on ceftriaxone to hospital. We discharged on Keflex 500 milligrams by mouth daily at bedtime for 12 more days. Patient received Ancef in the hospital in February 2018 without any adverse reaction. Patient has been given instructions to come back to hospital if she develops a rash. Blood cultures are negative to date. Vancomycin was discontinued  2. Left mild hydronephrosis- urology was consulted, recommended ureteroscopy as outpatient to rule out neoplasia. Patient to follow-up with urology in 2 weeks 3. End-stage renal disease- patient currently on hemodialysis Monday Wednesday Friday per nephrology. 4. Chronic  anemia- hemoglobin is 11.0, management per nephrology 5. Recent cataract surgery- patient had recent cataract surgery on 09/11/2016, continue ophthalmic eyedrops including Difluprednate, ofloxacin, Levro. 6. Hypertension-blood pressure stable, continue hydralazine, labetalol.  Procedures:  None   Consultations:  Nephrology  Discharge Exam: Vitals:   09/14/16 0951 09/14/16 1013  BP: (!) 150/52 (!) 168/54  Pulse: 65 63  Resp:  15  Temp:  98 F (36.7 C)    General: Appears in no acute distress Cardiovascular: RRR, S1S2 Respiratory: clear bilaterally Abdomen- soft, minimal left flank tenderness to palpation  Discharge Instructions   Discharge Instructions    Diet - low sodium heart healthy    Complete by:  As directed    Discharge instructions    Complete by:  As directed    If you develop a rash, go to nearest ER or hospital   Increase activity slowly    Complete by:  As directed      Current Discharge Medication List    START taking these medications   Details  cephALEXin (KEFLEX) 500 MG capsule Take 1 capsule (500 mg total) by mouth at bedtime. Qty: 12 capsule, Refills: 0      CONTINUE these medications which have NOT CHANGED   Details  amLODipine (NORVASC) 10 MG tablet Take 10 mg by mouth every other day.     Difluprednate (DUREZOL) 0.05 % EMUL Apply 1 drop to eye See admin instructions. 1 drop twice daily in the L-eye for 1 week followed by daily in the L-eye for 1 week then stop    famotidine (PEPCID) 20 MG tablet Take 1 tablet (20 mg total) by mouth at bedtime. Qty: 30 tablet, Refills: 0  hydrALAZINE (APRESOLINE) 25 MG tablet Take 1 tablet (25 mg total) by mouth every 8 (eight) hours. Qty: 90 tablet, Refills: 0    labetalol (NORMODYNE) 300 MG tablet Take 1 tablet (300 mg total) by mouth 2 (two) times daily.    nepafenac (ILEVRO) 0.3 % ophthalmic suspension Place 1 drop into the left eye 2 (two) times daily.    ofloxacin (OCUFLOX) 0.3 % ophthalmic  solution Place 1 drop into the left eye 2 (two) times daily.  Refills: 0    oxyCODONE-acetaminophen (PERCOCET/ROXICET) 5-325 MG tablet Take 1 tablet by mouth every 6 (six) hours as needed. Qty: 6 tablet, Refills: 0       Allergies  Allergen Reactions  . Strawberry Extract Swelling  . Nsaids Other (See Comments)    Has a healing stomach ulcer  . Clonidine Nausea And Vomiting  . Penicillins Rash and Other (See Comments)    Rash and rib pain. Has patient had a PCN reaction causing immediate rash, facial/tongue/throat swelling, SOB or lightheadedness with hypotension: No Has patient had a PCN reaction causing severe rash involving mucus membranes or skin necrosis: No Has patient had a PCN reaction that required hospitalization: No Has patient had a PCN reaction occurring within the last 10 years: No If all of the above answers are "NO", then may proceed with Cephalosporin use.   . Sulfa Antibiotics Rash and Other (See Comments)    Rib pain   Follow-up Information    Alison Overly, MD Follow up in 2 week(s).   Specialty:  Family Medicine Contact information: [place] [address] [phone]        Alison Frock, MD. Schedule an appointment as soon as possible for a visit in 2 week(s).   Specialty:  Urology Contact information: [place] [address] [phone]            The results of significant diagnostics from this hospitalization (including imaging, microbiology, ancillary and laboratory) are listed below for reference.    Significant Diagnostic Studies: Ct Renal Stone Study  Result Date: 09/12/2016 CLINICAL DATA:  Left flank pain beginning 3 hours ago. Patient is on dialysis with last treatment yesterday. EXAM: CT ABDOMEN AND PELVIS WITHOUT CONTRAST TECHNIQUE: Multidetector CT imaging of the abdomen and pelvis was performed following the standard protocol without IV contrast. COMPARISON:  None. FINDINGS: Lower chest:  Stable cardiomegaly.  No acute pulmonary disease. Hepatobiliary: Go status post cholecystectomy. The unenhanced liver is unremarkable. Pancreas: Mild pancreatic atrophy without ductal dilatation or mass. No peripancreatic inflammation. Spleen: No splenomegaly or focal mass lesion. Adrenals/Urinary Tract: Normal appearing adrenal glands. Perinephric fat stranding is again visualized about both kidneys more so on the left with caliectasis in dilated extrarenal pelves. Mild thickening of the renal pelvic walls and proximal ureters is again identified though not well characterized given lack of IV contrast. Findings may reflect changes of chronic urinary tract infection given the moderate diffuse transmural thickening of the bladder. No nephrolithiasis is noted. Stomach/Bowel: The stomach is nondistended. No gastric wall thickening. No evidence of bowel obstruction. There is normal small bowel rotation. No bowel obstruction is identified. There is diverticular changes along the descending colon and sigmoid without evidence of diverticulitis. Vascular/Lymphatic: There is abdominal aorto iliac atherosclerosis without aneurysm. Small lymph nodes are identified in the gastrohepatic and hepatoduodenal ligaments with small left para-aortic lymph node again noted. No pelvic sidewall lymphadenopathy. Reproductive: Hysterectomy.  No adnexal mass. Other: Diastases of the rectus muscles  with slight protrusion of omental fat and large bowel as before along the lower ventral abdomen without obstruction. Musculoskeletal: No worrisome lytic or sclerotic osseous lesions. No acute osseous appearing abnormality. IMPRESSION: 1. Chronic perinephric fat stranding more so on the left without obstructive uropathy. There is mild mural thickening of the renal pelves. This in conjunction with moderate thickening of the bladder is believed to reflect changes of chronic urinary tract infection and less likely due to urothelial neoplasm given the  chronicity and lack of significant change since prior as well as bilaterality of this finding. 2. Colonic diverticulosis without acute diverticulitis. 3. Cholecystectomy.  Hysterectomy. Electronically Signed   By: Ashley Royalty M.D.   On: 09/12/2016 01:00    Microbiology: Recent Results (from the past 240 hour(s))  Urine culture     Status: Abnormal   Collection Time: 09/12/16  1:55 AM  Result Value Ref Range Status   Specimen Description URINE, CLEAN CATCH  Final   Special Requests NONE  Final   Culture >=100,000 COLONIES/mL ESCHERICHIA COLI (A)  Final   Report Status 09/14/2016 FINAL  Final   Organism ID, Bacteria ESCHERICHIA COLI (A)  Final      Susceptibility   Escherichia coli - MIC*    AMPICILLIN >=32 RESISTANT Resistant     CEFAZOLIN 8 SENSITIVE Sensitive     CEFTRIAXONE <=1 SENSITIVE Sensitive     CIPROFLOXACIN <=0.25 SENSITIVE Sensitive     GENTAMICIN <=1 SENSITIVE Sensitive     IMIPENEM <=0.25 SENSITIVE Sensitive     NITROFURANTOIN <=16 SENSITIVE Sensitive     TRIMETH/SULFA <=20 SENSITIVE Sensitive     AMPICILLIN/SULBACTAM 16 INTERMEDIATE Intermediate     PIP/TAZO <=4 SENSITIVE Sensitive     Extended ESBL NEGATIVE Sensitive     * >=100,000 COLONIES/mL ESCHERICHIA COLI  MRSA PCR Screening     Status: None   Collection Time: 09/12/16 10:08 AM  Result Value Ref Range Status   MRSA by PCR NEGATIVE NEGATIVE Final    Comment:        The GeneXpert MRSA Assay (FDA approved for NASAL specimens only), is one component of a comprehensive MRSA colonization surveillance program. It is not intended to diagnose MRSA infection nor to guide or monitor treatment for MRSA infections.      Labs: Basic Metabolic Panel:  Recent Labs Lab 09/11/16 2322 09/12/16 0452 09/13/16 0433  NA 138 137 131*  K 4.1 4.6 3.7  CL 98* 96* 94*  CO2 26 27 26   GLUCOSE 118* 138* 103*  BUN 29* 31* 17  CREATININE 5.24* 5.65* 3.67*  CALCIUM 9.3 9.1 8.3*   Liver Function Tests: No results for  input(s): AST, ALT, ALKPHOS, BILITOT, PROT, ALBUMIN in the last 168 hours. No results for input(s): LIPASE, AMYLASE in the last 168 hours. No results for input(s): AMMONIA in the last 168 hours. CBC:  Recent Labs Lab 09/11/16 2328 09/12/16 0452 09/13/16 0433  WBC 12.1* 13.2* 13.2*  NEUTROABS 10.2*  --   --   HGB 12.4 11.9* 11.0*  HCT 37.4 36.7 35.0*  MCV 100.5* 102.8* 104.2*  PLT 209 174 195    CBG:  Recent Labs Lab 09/13/16 0801  GLUCAP 179*       Signed:  Chantal Worthey S MD.  [facility] 09/14/2016, 11:43 AM

## 2016-09-15 DIAGNOSIS — R52 Pain, unspecified: Secondary | ICD-10-CM | POA: Diagnosis not present

## 2016-09-15 DIAGNOSIS — N186 End stage renal disease: Secondary | ICD-10-CM | POA: Diagnosis not present

## 2016-09-17 DIAGNOSIS — N186 End stage renal disease: Secondary | ICD-10-CM | POA: Diagnosis not present

## 2016-09-17 DIAGNOSIS — R52 Pain, unspecified: Secondary | ICD-10-CM | POA: Diagnosis not present

## 2016-09-17 LAB — CULTURE, BLOOD (ROUTINE X 2)
CULTURE: NO GROWTH
Culture: NO GROWTH
SPECIAL REQUESTS: ADEQUATE
Special Requests: ADEQUATE

## 2016-09-19 DIAGNOSIS — R52 Pain, unspecified: Secondary | ICD-10-CM | POA: Diagnosis not present

## 2016-09-19 DIAGNOSIS — N186 End stage renal disease: Secondary | ICD-10-CM | POA: Diagnosis not present

## 2016-09-22 DIAGNOSIS — N186 End stage renal disease: Secondary | ICD-10-CM | POA: Diagnosis not present

## 2016-09-24 DIAGNOSIS — N186 End stage renal disease: Secondary | ICD-10-CM | POA: Diagnosis not present

## 2016-09-25 DIAGNOSIS — I129 Hypertensive chronic kidney disease with stage 1 through stage 4 chronic kidney disease, or unspecified chronic kidney disease: Secondary | ICD-10-CM | POA: Diagnosis not present

## 2016-09-25 DIAGNOSIS — N186 End stage renal disease: Secondary | ICD-10-CM | POA: Diagnosis not present

## 2016-09-25 DIAGNOSIS — Z992 Dependence on renal dialysis: Secondary | ICD-10-CM | POA: Diagnosis not present

## 2016-09-25 DIAGNOSIS — H4313 Vitreous hemorrhage, bilateral: Secondary | ICD-10-CM | POA: Diagnosis not present

## 2016-09-26 DIAGNOSIS — M9905 Segmental and somatic dysfunction of pelvic region: Secondary | ICD-10-CM | POA: Diagnosis not present

## 2016-09-26 DIAGNOSIS — M5441 Lumbago with sciatica, right side: Secondary | ICD-10-CM | POA: Diagnosis not present

## 2016-09-26 DIAGNOSIS — N186 End stage renal disease: Secondary | ICD-10-CM | POA: Diagnosis not present

## 2016-09-26 DIAGNOSIS — R52 Pain, unspecified: Secondary | ICD-10-CM | POA: Diagnosis not present

## 2016-09-26 DIAGNOSIS — M9904 Segmental and somatic dysfunction of sacral region: Secondary | ICD-10-CM | POA: Diagnosis not present

## 2016-09-26 DIAGNOSIS — M9903 Segmental and somatic dysfunction of lumbar region: Secondary | ICD-10-CM | POA: Diagnosis not present

## 2016-09-27 ENCOUNTER — Encounter (HOSPITAL_COMMUNITY): Payer: Self-pay | Admitting: *Deleted

## 2016-09-27 NOTE — Progress Notes (Signed)
Patient denies chest pain, shortness of breath. Requested that patient contact Dr. Lianne Moris office and let them know she was in hospital for kidney infection.

## 2016-09-29 ENCOUNTER — Other Ambulatory Visit: Payer: Self-pay

## 2016-09-29 DIAGNOSIS — N186 End stage renal disease: Secondary | ICD-10-CM | POA: Diagnosis not present

## 2016-09-29 DIAGNOSIS — M9904 Segmental and somatic dysfunction of sacral region: Secondary | ICD-10-CM | POA: Diagnosis not present

## 2016-09-29 DIAGNOSIS — M5441 Lumbago with sciatica, right side: Secondary | ICD-10-CM | POA: Diagnosis not present

## 2016-09-29 DIAGNOSIS — M9905 Segmental and somatic dysfunction of pelvic region: Secondary | ICD-10-CM | POA: Diagnosis not present

## 2016-09-29 DIAGNOSIS — M9903 Segmental and somatic dysfunction of lumbar region: Secondary | ICD-10-CM | POA: Diagnosis not present

## 2016-10-01 DIAGNOSIS — M9905 Segmental and somatic dysfunction of pelvic region: Secondary | ICD-10-CM | POA: Diagnosis not present

## 2016-10-01 DIAGNOSIS — M9904 Segmental and somatic dysfunction of sacral region: Secondary | ICD-10-CM | POA: Diagnosis not present

## 2016-10-01 DIAGNOSIS — N186 End stage renal disease: Secondary | ICD-10-CM | POA: Diagnosis not present

## 2016-10-01 DIAGNOSIS — M9903 Segmental and somatic dysfunction of lumbar region: Secondary | ICD-10-CM | POA: Diagnosis not present

## 2016-10-01 DIAGNOSIS — M5441 Lumbago with sciatica, right side: Secondary | ICD-10-CM | POA: Diagnosis not present

## 2016-10-03 DIAGNOSIS — N186 End stage renal disease: Secondary | ICD-10-CM | POA: Diagnosis not present

## 2016-10-06 DIAGNOSIS — N186 End stage renal disease: Secondary | ICD-10-CM | POA: Diagnosis not present

## 2016-10-06 DIAGNOSIS — M9905 Segmental and somatic dysfunction of pelvic region: Secondary | ICD-10-CM | POA: Diagnosis not present

## 2016-10-06 DIAGNOSIS — R52 Pain, unspecified: Secondary | ICD-10-CM | POA: Diagnosis not present

## 2016-10-06 DIAGNOSIS — M9904 Segmental and somatic dysfunction of sacral region: Secondary | ICD-10-CM | POA: Diagnosis not present

## 2016-10-06 DIAGNOSIS — M9903 Segmental and somatic dysfunction of lumbar region: Secondary | ICD-10-CM | POA: Diagnosis not present

## 2016-10-06 DIAGNOSIS — M5441 Lumbago with sciatica, right side: Secondary | ICD-10-CM | POA: Diagnosis not present

## 2016-10-09 DIAGNOSIS — N1339 Other hydronephrosis: Secondary | ICD-10-CM | POA: Diagnosis not present

## 2016-10-09 DIAGNOSIS — Z992 Dependence on renal dialysis: Secondary | ICD-10-CM | POA: Diagnosis not present

## 2016-10-09 DIAGNOSIS — I132 Hypertensive heart and chronic kidney disease with heart failure and with stage 5 chronic kidney disease, or end stage renal disease: Secondary | ICD-10-CM | POA: Diagnosis not present

## 2016-10-09 DIAGNOSIS — Z09 Encounter for follow-up examination after completed treatment for conditions other than malignant neoplasm: Secondary | ICD-10-CM | POA: Diagnosis not present

## 2016-10-09 DIAGNOSIS — N12 Tubulo-interstitial nephritis, not specified as acute or chronic: Secondary | ICD-10-CM | POA: Diagnosis not present

## 2016-10-10 DIAGNOSIS — N186 End stage renal disease: Secondary | ICD-10-CM | POA: Diagnosis not present

## 2016-10-10 DIAGNOSIS — R52 Pain, unspecified: Secondary | ICD-10-CM | POA: Diagnosis not present

## 2016-10-13 DIAGNOSIS — N186 End stage renal disease: Secondary | ICD-10-CM | POA: Diagnosis not present

## 2016-10-13 DIAGNOSIS — R52 Pain, unspecified: Secondary | ICD-10-CM | POA: Diagnosis not present

## 2016-10-13 DIAGNOSIS — Z23 Encounter for immunization: Secondary | ICD-10-CM | POA: Diagnosis not present

## 2016-10-13 DIAGNOSIS — R197 Diarrhea, unspecified: Secondary | ICD-10-CM | POA: Diagnosis not present

## 2016-10-15 DIAGNOSIS — N186 End stage renal disease: Secondary | ICD-10-CM | POA: Diagnosis not present

## 2016-10-15 DIAGNOSIS — R197 Diarrhea, unspecified: Secondary | ICD-10-CM | POA: Diagnosis not present

## 2016-10-15 DIAGNOSIS — Z23 Encounter for immunization: Secondary | ICD-10-CM | POA: Diagnosis not present

## 2016-10-15 DIAGNOSIS — R52 Pain, unspecified: Secondary | ICD-10-CM | POA: Diagnosis not present

## 2016-10-16 DIAGNOSIS — Z87891 Personal history of nicotine dependence: Secondary | ICD-10-CM | POA: Diagnosis not present

## 2016-10-16 DIAGNOSIS — I509 Heart failure, unspecified: Secondary | ICD-10-CM | POA: Diagnosis not present

## 2016-10-16 DIAGNOSIS — I132 Hypertensive heart and chronic kidney disease with heart failure and with stage 5 chronic kidney disease, or end stage renal disease: Secondary | ICD-10-CM | POA: Diagnosis not present

## 2016-10-16 DIAGNOSIS — D631 Anemia in chronic kidney disease: Secondary | ICD-10-CM | POA: Diagnosis not present

## 2016-10-16 DIAGNOSIS — Z992 Dependence on renal dialysis: Secondary | ICD-10-CM | POA: Diagnosis not present

## 2016-10-16 DIAGNOSIS — M545 Low back pain: Secondary | ICD-10-CM | POA: Diagnosis not present

## 2016-10-16 DIAGNOSIS — N186 End stage renal disease: Secondary | ICD-10-CM | POA: Diagnosis not present

## 2016-10-17 ENCOUNTER — Other Ambulatory Visit: Payer: Self-pay | Admitting: *Deleted

## 2016-10-17 DIAGNOSIS — Z23 Encounter for immunization: Secondary | ICD-10-CM | POA: Diagnosis not present

## 2016-10-17 DIAGNOSIS — N186 End stage renal disease: Secondary | ICD-10-CM | POA: Diagnosis not present

## 2016-10-17 DIAGNOSIS — R197 Diarrhea, unspecified: Secondary | ICD-10-CM | POA: Diagnosis not present

## 2016-10-17 DIAGNOSIS — R52 Pain, unspecified: Secondary | ICD-10-CM | POA: Diagnosis not present

## 2016-10-20 DIAGNOSIS — Z23 Encounter for immunization: Secondary | ICD-10-CM | POA: Diagnosis not present

## 2016-10-20 DIAGNOSIS — N186 End stage renal disease: Secondary | ICD-10-CM | POA: Diagnosis not present

## 2016-10-21 DIAGNOSIS — Z01818 Encounter for other preprocedural examination: Secondary | ICD-10-CM | POA: Diagnosis not present

## 2016-10-21 DIAGNOSIS — E119 Type 2 diabetes mellitus without complications: Secondary | ICD-10-CM | POA: Diagnosis not present

## 2016-10-22 DIAGNOSIS — Z23 Encounter for immunization: Secondary | ICD-10-CM | POA: Diagnosis not present

## 2016-10-22 DIAGNOSIS — N186 End stage renal disease: Secondary | ICD-10-CM | POA: Diagnosis not present

## 2016-10-23 DIAGNOSIS — I132 Hypertensive heart and chronic kidney disease with heart failure and with stage 5 chronic kidney disease, or end stage renal disease: Secondary | ICD-10-CM | POA: Diagnosis not present

## 2016-10-23 DIAGNOSIS — M545 Low back pain: Secondary | ICD-10-CM | POA: Diagnosis not present

## 2016-10-23 DIAGNOSIS — I509 Heart failure, unspecified: Secondary | ICD-10-CM | POA: Diagnosis not present

## 2016-10-23 DIAGNOSIS — Z992 Dependence on renal dialysis: Secondary | ICD-10-CM | POA: Diagnosis not present

## 2016-10-23 DIAGNOSIS — D631 Anemia in chronic kidney disease: Secondary | ICD-10-CM | POA: Diagnosis not present

## 2016-10-23 DIAGNOSIS — N186 End stage renal disease: Secondary | ICD-10-CM | POA: Diagnosis not present

## 2016-10-23 DIAGNOSIS — Z87891 Personal history of nicotine dependence: Secondary | ICD-10-CM | POA: Diagnosis not present

## 2016-10-24 DIAGNOSIS — Z23 Encounter for immunization: Secondary | ICD-10-CM | POA: Diagnosis not present

## 2016-10-24 DIAGNOSIS — N186 End stage renal disease: Secondary | ICD-10-CM | POA: Diagnosis not present

## 2016-10-25 DIAGNOSIS — N186 End stage renal disease: Secondary | ICD-10-CM | POA: Diagnosis not present

## 2016-10-25 DIAGNOSIS — I129 Hypertensive chronic kidney disease with stage 1 through stage 4 chronic kidney disease, or unspecified chronic kidney disease: Secondary | ICD-10-CM | POA: Diagnosis not present

## 2016-10-25 DIAGNOSIS — Z992 Dependence on renal dialysis: Secondary | ICD-10-CM | POA: Diagnosis not present

## 2016-10-27 DIAGNOSIS — N186 End stage renal disease: Secondary | ICD-10-CM | POA: Diagnosis not present

## 2016-10-28 DIAGNOSIS — H4312 Vitreous hemorrhage, left eye: Secondary | ICD-10-CM | POA: Diagnosis not present

## 2016-10-29 DIAGNOSIS — N186 End stage renal disease: Secondary | ICD-10-CM | POA: Diagnosis not present

## 2016-10-30 DIAGNOSIS — N186 End stage renal disease: Secondary | ICD-10-CM | POA: Diagnosis not present

## 2016-10-30 DIAGNOSIS — D631 Anemia in chronic kidney disease: Secondary | ICD-10-CM | POA: Diagnosis not present

## 2016-10-30 DIAGNOSIS — M545 Low back pain: Secondary | ICD-10-CM | POA: Diagnosis not present

## 2016-10-30 DIAGNOSIS — Z87891 Personal history of nicotine dependence: Secondary | ICD-10-CM | POA: Diagnosis not present

## 2016-10-30 DIAGNOSIS — I132 Hypertensive heart and chronic kidney disease with heart failure and with stage 5 chronic kidney disease, or end stage renal disease: Secondary | ICD-10-CM | POA: Diagnosis not present

## 2016-10-30 DIAGNOSIS — I509 Heart failure, unspecified: Secondary | ICD-10-CM | POA: Diagnosis not present

## 2016-10-30 DIAGNOSIS — Z992 Dependence on renal dialysis: Secondary | ICD-10-CM | POA: Diagnosis not present

## 2016-10-31 ENCOUNTER — Encounter (HOSPITAL_COMMUNITY): Payer: Self-pay

## 2016-10-31 DIAGNOSIS — N186 End stage renal disease: Secondary | ICD-10-CM | POA: Diagnosis not present

## 2016-10-31 DIAGNOSIS — M549 Dorsalgia, unspecified: Secondary | ICD-10-CM | POA: Diagnosis not present

## 2016-10-31 NOTE — Progress Notes (Signed)
PCP -  Quintella Reichert Cardiologist -  Denies  Chest x-ray - 06/13/16 EKG - 09/12/16 Stress Test - Denies ECHO -  06/15/16 Cardiac Cath - Denies  Sleep Study - Denies CPAP - None   Pt denies having chest pain, sob, or fever at this time. All instructions explained to the pt, with a verbal understanding of the material. Pt told she can take the following medications the day of surgery: Acetaminophen (TYLENOL),  Difluprednate (DUREZOL), Labetalol (NORMODYNE). Pt told to stop taking as of now all Aspirins, Vitamins, Fish oils, and Herbal medicaitons. Also stop all NSAIDS i.e. Advil, Motrin, Aleve, Anaprox, Naproxen, BC and Goody Powders.The opportunity to ask questions was provided.

## 2016-11-03 DIAGNOSIS — N186 End stage renal disease: Secondary | ICD-10-CM | POA: Diagnosis not present

## 2016-11-04 ENCOUNTER — Ambulatory Visit (HOSPITAL_COMMUNITY)
Admission: RE | Admit: 2016-11-04 | Discharge: 2016-11-04 | Disposition: A | Discharge status: Home / Self Care | Payer: BLUE CROSS/BLUE SHIELD | Source: Ambulatory Visit | Attending: Vascular Surgery | Admitting: Vascular Surgery

## 2016-11-04 ENCOUNTER — Encounter (HOSPITAL_COMMUNITY)
Admission: RE | Disposition: A | Discharge status: Home / Self Care | Payer: Self-pay | Source: Ambulatory Visit | Attending: Vascular Surgery

## 2016-11-04 ENCOUNTER — Encounter (HOSPITAL_COMMUNITY): Payer: Self-pay

## 2016-11-04 ENCOUNTER — Ambulatory Visit (HOSPITAL_COMMUNITY): Payer: BLUE CROSS/BLUE SHIELD | Admitting: Anesthesiology

## 2016-11-04 ENCOUNTER — Telehealth: Payer: Self-pay | Admitting: Vascular Surgery

## 2016-11-04 DIAGNOSIS — N186 End stage renal disease: Secondary | ICD-10-CM | POA: Diagnosis not present

## 2016-11-04 DIAGNOSIS — N185 Chronic kidney disease, stage 5: Secondary | ICD-10-CM | POA: Diagnosis not present

## 2016-11-04 DIAGNOSIS — Z79899 Other long term (current) drug therapy: Secondary | ICD-10-CM | POA: Insufficient documentation

## 2016-11-04 DIAGNOSIS — I120 Hypertensive chronic kidney disease with stage 5 chronic kidney disease or end stage renal disease: Secondary | ICD-10-CM | POA: Insufficient documentation

## 2016-11-04 DIAGNOSIS — K219 Gastro-esophageal reflux disease without esophagitis: Secondary | ICD-10-CM | POA: Diagnosis not present

## 2016-11-04 DIAGNOSIS — Z87891 Personal history of nicotine dependence: Secondary | ICD-10-CM | POA: Diagnosis not present

## 2016-11-04 DIAGNOSIS — Z992 Dependence on renal dialysis: Secondary | ICD-10-CM | POA: Diagnosis not present

## 2016-11-04 HISTORY — DX: Pneumonia, unspecified organism: J18.9

## 2016-11-04 HISTORY — DX: Anxiety disorder, unspecified: F41.9

## 2016-11-04 HISTORY — PX: BASCILIC VEIN TRANSPOSITION: SHX5742

## 2016-11-04 HISTORY — DX: Umbilical hernia without obstruction or gangrene: K42.9

## 2016-11-04 LAB — GLUCOSE, CAPILLARY: GLUCOSE-CAPILLARY: 131 mg/dL — AB (ref 65–99)

## 2016-11-04 LAB — POCT I-STAT 4, (NA,K, GLUC, HGB,HCT)
GLUCOSE: 126 mg/dL — AB (ref 65–99)
HCT: 31 % — ABNORMAL LOW (ref 36.0–46.0)
HEMOGLOBIN: 10.5 g/dL — AB (ref 12.0–15.0)
Potassium: 4.3 mmol/L (ref 3.5–5.1)
SODIUM: 136 mmol/L (ref 135–145)

## 2016-11-04 SURGERY — TRANSPOSITION, VEIN, BASILIC
Anesthesia: General | Site: Arm Upper | Laterality: Left

## 2016-11-04 MED ORDER — EPHEDRINE 5 MG/ML INJ
INTRAVENOUS | Status: AC
  Filled 2016-11-04: qty 30

## 2016-11-04 MED ORDER — VANCOMYCIN HCL IN DEXTROSE 1-5 GM/200ML-% IV SOLN
INTRAVENOUS | Status: AC
  Filled 2016-11-04: qty 200

## 2016-11-04 MED ORDER — DEXTROSE 5 % IV SOLN
INTRAVENOUS | Status: DC | PRN
  Administered 2016-11-04: 15 ug/min via INTRAVENOUS

## 2016-11-04 MED ORDER — OXYCODONE HCL 5 MG/5ML PO SOLN: 5.0000 mg | Freq: Once | ORAL | Status: DC | PRN

## 2016-11-04 MED ORDER — HEMOSTATIC AGENTS (NO CHARGE) OPTIME
TOPICAL | Status: DC | PRN
  Administered 2016-11-04 (×2): 1 via TOPICAL

## 2016-11-04 MED ORDER — LIDOCAINE HCL (PF) 1 % IJ SOLN
INTRAMUSCULAR | Status: DC | PRN
  Administered 2016-11-04: 30 mL

## 2016-11-04 MED ORDER — LIDOCAINE HCL (PF) 1 % IJ SOLN
INTRAMUSCULAR | Status: AC
  Filled 2016-11-04: qty 30

## 2016-11-04 MED ORDER — SODIUM CHLORIDE 0.9 % IV SOLN: INTRAVENOUS | Status: DC

## 2016-11-04 MED ORDER — ONDANSETRON HCL 4 MG/2ML IJ SOLN
INTRAMUSCULAR | Status: DC | PRN
  Administered 2016-11-04: 4 mg via INTRAVENOUS

## 2016-11-04 MED ORDER — OXYCODONE-ACETAMINOPHEN 5-325 MG PO TABS: 1.0000 | ORAL_TABLET | Freq: Four times a day (QID) | ORAL | 0 refills | Status: DC | PRN

## 2016-11-04 MED ORDER — PROPOFOL 10 MG/ML IV BOLUS
INTRAVENOUS | Status: AC
  Filled 2016-11-04: qty 20

## 2016-11-04 MED ORDER — 0.9 % SODIUM CHLORIDE (POUR BTL) OPTIME
TOPICAL | Status: DC | PRN
  Administered 2016-11-04: 1000 mL

## 2016-11-04 MED ORDER — FENTANYL CITRATE (PF) 250 MCG/5ML IJ SOLN
INTRAMUSCULAR | Status: AC
  Filled 2016-11-04: qty 5

## 2016-11-04 MED ORDER — OXYCODONE HCL 5 MG PO TABS: 5.0000 mg | ORAL_TABLET | Freq: Once | ORAL | Status: DC | PRN

## 2016-11-04 MED ORDER — LIDOCAINE HCL (CARDIAC) 20 MG/ML IV SOLN
INTRAVENOUS | Status: DC | PRN
  Administered 2016-11-04: 90 mg via INTRAVENOUS

## 2016-11-04 MED ORDER — CHLORHEXIDINE GLUCONATE CLOTH 2 % EX PADS: 6.0000 | MEDICATED_PAD | Freq: Once | CUTANEOUS | Status: DC

## 2016-11-04 MED ORDER — DEXAMETHASONE SODIUM PHOSPHATE 10 MG/ML IJ SOLN
INTRAMUSCULAR | Status: DC | PRN
  Administered 2016-11-04: 5 mg via INTRAVENOUS

## 2016-11-04 MED ORDER — ONDANSETRON HCL 4 MG/2ML IJ SOLN
4.0000 mg | Freq: Once | INTRAMUSCULAR | Status: AC | PRN
  Administered 2016-11-04: 4 mg via INTRAVENOUS

## 2016-11-04 MED ORDER — METOCLOPRAMIDE HCL 5 MG/ML IJ SOLN
10.0000 mg | Freq: Once | INTRAMUSCULAR | Status: AC
  Administered 2016-11-04: 10 mg via INTRAVENOUS

## 2016-11-04 MED ORDER — FENTANYL CITRATE (PF) 100 MCG/2ML IJ SOLN: 25.0000 ug | INTRAMUSCULAR | Status: DC | PRN

## 2016-11-04 MED ORDER — FENTANYL CITRATE (PF) 100 MCG/2ML IJ SOLN
INTRAMUSCULAR | Status: DC | PRN
  Administered 2016-11-04: 25 ug via INTRAVENOUS
  Administered 2016-11-04: 75 ug via INTRAVENOUS
  Administered 2016-11-04 (×2): 25 ug via INTRAVENOUS
  Administered 2016-11-04: 50 ug via INTRAVENOUS

## 2016-11-04 MED ORDER — DEXAMETHASONE SODIUM PHOSPHATE 10 MG/ML IJ SOLN
INTRAMUSCULAR | Status: AC
  Filled 2016-11-04: qty 1

## 2016-11-04 MED ORDER — ONDANSETRON HCL 4 MG/2ML IJ SOLN
INTRAMUSCULAR | Status: AC
  Filled 2016-11-04: qty 2

## 2016-11-04 MED ORDER — LIDOCAINE HCL (CARDIAC) 20 MG/ML IV SOLN
INTRAVENOUS | Status: AC
  Filled 2016-11-04: qty 10

## 2016-11-04 MED ORDER — MIDAZOLAM HCL 2 MG/2ML IJ SOLN
INTRAMUSCULAR | Status: AC
  Filled 2016-11-04: qty 2

## 2016-11-04 MED ORDER — VANCOMYCIN HCL IN DEXTROSE 1-5 GM/200ML-% IV SOLN
1000.0000 mg | INTRAVENOUS | Status: AC
  Administered 2016-11-04: 1000 mg via INTRAVENOUS

## 2016-11-04 MED ORDER — PROPOFOL 10 MG/ML IV BOLUS
INTRAVENOUS | Status: DC | PRN
  Administered 2016-11-04: 150 mg via INTRAVENOUS

## 2016-11-04 MED ORDER — SODIUM CHLORIDE 0.9 % IV SOLN
INTRAVENOUS | Status: DC | PRN
  Administered 2016-11-04: 10:00:00

## 2016-11-04 MED ORDER — PHENYLEPHRINE 40 MCG/ML (10ML) SYRINGE FOR IV PUSH (FOR BLOOD PRESSURE SUPPORT)
PREFILLED_SYRINGE | INTRAVENOUS | Status: AC
  Filled 2016-11-04: qty 20

## 2016-11-04 MED ORDER — SODIUM CHLORIDE 0.9 % IV SOLN
INTRAVENOUS | Status: DC
  Administered 2016-11-04 (×2): via INTRAVENOUS

## 2016-11-04 MED ORDER — METOCLOPRAMIDE HCL 5 MG/ML IJ SOLN
INTRAMUSCULAR | Status: AC
  Filled 2016-11-04: qty 2

## 2016-11-04 SURGICAL SUPPLY — 34 items
ARMBAND PINK RESTRICT EXTREMIT (MISCELLANEOUS) ×2 IMPLANT
CANISTER SUCT 3000ML PPV (MISCELLANEOUS) ×2 IMPLANT
CORD BIPOLAR FORCEPS 12FT (ELECTRODE) ×2 IMPLANT
CORDS BIPOLAR (ELECTRODE) IMPLANT
COVER PROBE W GEL 5X96 (DRAPES) IMPLANT
DECANTER SPIKE VIAL GLASS SM (MISCELLANEOUS) IMPLANT
DERMABOND ADVANCED (GAUZE/BANDAGES/DRESSINGS) ×1
DERMABOND ADVANCED .7 DNX12 (GAUZE/BANDAGES/DRESSINGS) ×1 IMPLANT
ELECT REM PT RETURN 9FT ADLT (ELECTROSURGICAL) ×2
ELECTRODE REM PT RTRN 9FT ADLT (ELECTROSURGICAL) ×1 IMPLANT
GAUZE SPONGE 4X4 16PLY XRAY LF (GAUZE/BANDAGES/DRESSINGS) ×2 IMPLANT
GLOVE BIO SURGEON STRL SZ7 (GLOVE) IMPLANT
GLOVE BIOGEL PI IND STRL 7.5 (GLOVE) IMPLANT
GLOVE BIOGEL PI INDICATOR 7.5 (GLOVE)
GOWN STRL REUS W/ TWL LRG LVL3 (GOWN DISPOSABLE) ×3 IMPLANT
GOWN STRL REUS W/TWL LRG LVL3 (GOWN DISPOSABLE) ×3
HEMOSTAT SPONGE AVITENE ULTRA (HEMOSTASIS) ×4 IMPLANT
KIT BASIN OR (CUSTOM PROCEDURE TRAY) ×2 IMPLANT
KIT ROOM TURNOVER OR (KITS) ×2 IMPLANT
NS IRRIG 1000ML POUR BTL (IV SOLUTION) ×2 IMPLANT
PACK CV ACCESS (CUSTOM PROCEDURE TRAY) ×2 IMPLANT
PAD ARMBOARD 7.5X6 YLW CONV (MISCELLANEOUS) ×4 IMPLANT
SUT MNCRL AB 4-0 PS2 18 (SUTURE) ×2 IMPLANT
SUT PROLENE 6 0 BV (SUTURE) ×2 IMPLANT
SUT PROLENE 7 0 BV 1 (SUTURE) ×4 IMPLANT
SUT SILK 2 0 SH (SUTURE) IMPLANT
SUT SILK 3 0 (SUTURE) ×2
SUT SILK 3-0 18XBRD TIE 12 (SUTURE) ×2 IMPLANT
SUT VIC AB 2-0 CT1 27 (SUTURE) ×1
SUT VIC AB 2-0 CT1 TAPERPNT 27 (SUTURE) ×1 IMPLANT
SUT VIC AB 3-0 SH 27 (SUTURE) ×4
SUT VIC AB 3-0 SH 27X BRD (SUTURE) ×4 IMPLANT
UNDERPAD 30X30 (UNDERPADS AND DIAPERS) ×2 IMPLANT
WATER STERILE IRR 1000ML POUR (IV SOLUTION) ×2 IMPLANT

## 2016-11-04 NOTE — Transfer of Care (Signed)
Immediate Anesthesia Transfer of Care Note  Patient: Alison Baker  Procedure(s) Performed: Procedure(s): SECOND STAGE BRACHIAL VEIN TRANSPOSITION (Left)  Patient Location: PACU  Anesthesia Type:General  Level of Consciousness: awake, alert  and oriented  Airway & Oxygen Therapy: Patient Spontanous Breathing and Patient connected to nasal cannula oxygen  Post-op Assessment: Report given to RN and Post -op Vital signs reviewed and stable  Post vital signs: Reviewed and stable  Last Vitals:  Vitals:   11/04/16 0737 11/04/16 1305  BP: (!) 198/68 (!) (P) 186/77  Pulse: 64 72  Resp: 18 16  Temp: 36.6 C (!) 36.2 C    Last Pain:  Vitals:   11/04/16 0737  TempSrc: Oral      Patients Stated Pain Goal: 0 (57/84/69 6295)  Complications: No apparent anesthesia complications

## 2016-11-04 NOTE — Telephone Encounter (Signed)
-----   Message from Mena Goes, RN sent at 11/04/2016  1:40 PM EDT ----- Regarding: 4 weeks no labs   ----- Message ----- From: Ulyses Amor, PA-C Sent: 11/04/2016  12:52 PM To: [facility] Charge Pool  F/U with Dr. Bridgett Larsson in 4 weeks s/p basilic vein transposition second stage no labs needed.

## 2016-11-04 NOTE — Telephone Encounter (Signed)
Sched appt 12/10/16 at 9:15. Lm on hm#.

## 2016-11-04 NOTE — Anesthesia Preprocedure Evaluation (Addendum)
Anesthesia Evaluation  Patient identified by MRN, date of birth, ID band Patient awake    Reviewed: Allergy & Precautions, Patient's Chart, lab work & pertinent test results, reviewed documented beta blocker date and time   History of Anesthesia Complications (+) PONV and history of anesthetic complications  Airway Mallampati: II  TM Distance: >3 FB Neck ROM: Full    Dental  (+) Upper Dentures, Lower Dentures, Dental Advisory Given   Pulmonary former smoker,    breath sounds clear to auscultation       Cardiovascular hypertension, Pt. on medications and Pt. on home beta blockers  Rhythm:Regular Rate:Normal     Neuro/Psych negative neurological ROS     GI/Hepatic PUD, GERD  Medicated,  Endo/Other    Renal/GU DialysisRenal diseaseHD since 05/2016 MWF. Last HD Mon 7/9     Musculoskeletal   Abdominal (+) + obese,   Peds  Hematology  (+) anemia ,   Anesthesia Other Findings Patient had recent left eye surgery at [place] per patient. Blood noted in left eye. Patient states she was advised to not lie completely flat. OR table head of bed elevated 5 degreed and patient's head propped on 2 pillows.  Reproductive/Obstetrics                           Anesthesia Physical Anesthesia Plan  ASA: III  Anesthesia Plan: General   Post-op Pain Management:    Induction: Intravenous  PONV Risk Score and Plan: Ondansetron and Propofol  Airway Management Planned: LMA  Additional Equipment:   Intra-op Plan:   Post-operative Plan:   Informed Consent: I have reviewed the patients History and Physical, chart, labs and discussed the procedure including the risks, benefits and alternatives for the proposed anesthesia with the patient or authorized representative who has indicated his/her understanding and acceptance.     Plan Discussed with: CRNA and Anesthesiologist  Anesthesia Plan Comments:          Anesthesia Quick Evaluation

## 2016-11-04 NOTE — Op Note (Signed)
OPERATIVE NOTE   PROCEDURE: left second stage brachial vein transposition (brachiobrachial arteriovenous fistula) placement  PRE-OPERATIVE DIAGNOSIS: end stage renal disease, s/p successful first stage brachial vein transposition   POST-OPERATIVE DIAGNOSIS: same as above   SURGEON: Adele Barthel, MD  ASSISTANT(S): Silva Bandy, PAC   ANESTHESIA: general  ESTIMATED BLOOD LOSS: 30 cc  FINDING(S): 1.  Fistula: >6 mm proximal 2/3, distal 1/3 tapers from 5 mm down to 4 mm 2.  Easily palpable thrill in fistula at end of case  SPECIMEN(S):  none  INDICATIONS:   Alison Baker is a 61 y.o. female who presents with end stage renal disease s/p successful left first stage brachial vein transposition.  The patient is scheduled for left second stage brachial vein transposition.  The patient is aware the risks include but are not limited to: bleeding, infection, steal syndrome, nerve damage, ischemic monomelic neuropathy, failure to mature, and need for additional procedures.  The patient is aware of the risks of the procedure and elects to proceed forward.   DESCRIPTION: After full informed written consent was obtained from the patient, the patient was brought back to the operating room and placed supine upon the operating table.  Prior to induction, the patient received IV antibiotics.   After obtaining adequate anesthesia, the patient was then prepped and draped in the standard fashion for a left arm access procedure.   I turned my attention first to identifying the patient's brachiobrachial arteriovenous fistula.  Using SonoSite guidance, the location of this fistula was marked out on the skin.  I inject 20 cc of 1% lidocaine without epinephrine along the incision to obtain additional anesthesia.  I made an longitudinal incision over the fistula from its arterial anastomosis up to its axillary extent.    I carefully dissected the fistula away from its adjacent nerves, ligating side branches in  the process.  The distal 1/3 of the brachial vein system had multiple side branches but there appeared to be one dominant conduit.  I ligated branches to maintain flow to this dominant vein.  Eventually the entirety of this fistula was mobilized.  I became evident that the vein ran deep and superficial to the median nerve.  I had to dissect out the median nerve to mobilize it more superficially so I could pass the fistula deep to the median nerve.    At this point, I dissected a plane superficial to the bicipital fascia.  The fistula was secured in this new location with interrupted 3-0 Vicryl stitches tied from side branches to the underlying fascia.  The deep subcutaneous tissue was inspected for bleeding.  Bleeding was controlled with electrocautery and placement of large pieces of Avitene.  I washed out the surgical site after waiting a few minutes, and there was no further bleeding.  I reapproximated the fascia with horizontal mattress stitches of 2-0 Vicryl.  The deep subcutaneous tissue was reapproximated with mattress sutures of 3-0 Vicryl to eliminate some of the dead space.  The superficial subcutaneous tissue was then reapproximated along the incision line with a running stitch of 3-0 Vicryl.    The skin was then reapproximated with a running subcuticular of 4-0 Monocryl.  The skin was then cleaned, dried, and reinforced with Dermabond.  The patient tolerated this procedure well. The fistula had an easily palpable thrill at the end of the case.  Additionally, I could palpate the radial pulse easily at the end of the case.   COMPLICATIONS: none  CONDITION: stable  Adele Barthel, MD, FACS [facility] Office: [phone] Pager: [phone]  11/04/2016, 12:33 PM

## 2016-11-04 NOTE — Anesthesia Postprocedure Evaluation (Signed)
Anesthesia Post Note  Patient: Alison Baker  Procedure(s) Performed: Procedure(s) (LRB): SECOND STAGE BRACHIAL VEIN TRANSPOSITION (Left)     Patient location during evaluation: PACU Anesthesia Type: General Level of consciousness: awake, awake and alert and oriented Pain management: pain level controlled Vital Signs Assessment: post-procedure vital signs reviewed and stable Respiratory status: spontaneous breathing, nonlabored ventilation and respiratory function stable Cardiovascular status: blood pressure returned to baseline Anesthetic complications: no    Last Vitals:  Vitals:   11/04/16 1415 11/04/16 1430  BP: (!) 141/65 (!) 146/68  Pulse: 66 65  Resp: 19 16  Temp:  36.9 C    Last Pain:  Vitals:   11/04/16 1435  TempSrc:   PainSc: 3                  Vista Sawatzky COKER

## 2016-11-04 NOTE — Anesthesia Procedure Notes (Signed)
Procedure Name: LMA Insertion Date/Time: 11/04/2016 10:34 AM Performed by: Valda Favia Pre-anesthesia Checklist: Patient identified, Emergency Drugs available, Suction available, Patient being monitored and Timeout performed Patient Re-evaluated:Patient Re-evaluated prior to inductionOxygen Delivery Method: Circle system utilized Preoxygenation: Pre-oxygenation with 100% oxygen Intubation Type: IV induction LMA: LMA inserted LMA Size: 4.0 Number of attempts: 1 Placement Confirmation: positive ETCO2 and breath sounds checked- equal and bilateral Tube secured with: Tape Dental Injury: Teeth and Oropharynx as per pre-operative assessment  Comments: Placed by Luciana Axe, CRNA

## 2016-11-04 NOTE — H&P (Signed)
Brief History and Physical  History of Present Illness  Alison Baker is a 61 y.o. female who presents with chief complaint: end stage renal disease.  The patient presents today for transposition of her matured left first stage brachial vein transposition.    Past Medical History:  Diagnosis Date  . Anemia   . Anxiety   . Complication of anesthesia    slow to awaken, sentive to medications rarely even takes a Tylenol.  . Diarrhea   . End-stage kidney disease ([place])    M/W/F-   . Gastric ulcer with hemorrhage   . GERD (gastroesophageal reflux disease)   . Hypertension   . Pneumonia   . PONV (postoperative nausea and vomiting)    after hysterectomy- 1980's  . Pre-diabetes   . Umbilical hernia     Past Surgical History:  Procedure Laterality Date  . ABDOMINAL HYSTERECTOMY    . APPENDECTOMY    . BASCILIC VEIN TRANSPOSITION Left 07/15/2016   Procedure: BRACHIAL VEIN TRANSPOSITION FIRST STAGE;  Surgeon: Conrad Plush, MD;  Location: [facility];  Service: Vascular;  Laterality: Left;  . CHOLECYSTECTOMY OPEN    . COLONOSCOPY W/ POLYPECTOMY    . EYE SURGERY Right    catarct  . EYE SURGERY Left 08/2016   cataract  . INSERTION OF DIALYSIS CATHETER    . IR GENERIC HISTORICAL  06/23/2016   IR US GUIDE VASC ACCESS RIGHT 06/23/2016 Arne Cleveland, MD [facility]  . IR GENERIC HISTORICAL  06/23/2016   IR FLUORO GUIDE CV LINE RIGHT 06/23/2016 Arne Cleveland, MD [facility]  . TOTAL ABDOMINAL HYSTERECTOMY W/ BILATERAL SALPINGOOPHORECTOMY    . TRABECULECTOMY Bilateral     Social History   Social History  . Marital status: Married    Spouse name: N/A  . Number of children: N/A  . Years of education: N/A   Occupational History  . Not on file.   Social History Main Topics  . Smoking status: Former Smoker    Types: Cigarettes    Quit date: 06/27/1995  . Smokeless tobacco: Never Used  . Alcohol use No  . Drug use: No  . Sexual activity: Not on file   Other Topics Concern   . Not on file   Social History Narrative  . No narrative on file    Family History  Problem Relation Age of Onset  . Hypertension Mother     No current facility-administered medications on file prior to encounter.    Current Outpatient Prescriptions on File Prior to Encounter  Medication Sig Dispense Refill  . famotidine (PEPCID) 20 MG tablet Take 1 tablet (20 mg total) by mouth at bedtime. 30 tablet 0  . labetalol (NORMODYNE) 300 MG tablet Take 1 tablet (300 mg total) by mouth 2 (two) times daily. (Patient taking differently: Take 150-300 mg by mouth See admin instructions. On dialysis days MWF, takes 150mg  in the AM  All other days takes 300 mg in the morning and 300 mg at night)    . hydrALAZINE (APRESOLINE) 25 MG tablet Take 1 tablet (25 mg total) by mouth every 8 (eight) hours. (Patient not taking: Reported on 09/25/2016) 90 tablet 0  . oxyCODONE-acetaminophen (PERCOCET/ROXICET) 5-325 MG tablet Take 1 tablet by mouth every 6 (six) hours as needed. (Patient not taking: Reported on 09/05/2016) 6 tablet 0    Allergies  Allergen Reactions  . Nsaids Other (See Comments)    Has a healing stomach ulcer  . Penicillins Rash and Other (See Comments)  Rash and rib pain.  PATIENT HAD A PCN REACTION WITH IMMEDIATE RASH, FACIAL/TONGUE/THROAT SWELLING, SOB, OR LIGHTHEADEDNESS WITH HYPOTENSION:  #  #  #  YES  #  #  #   Has patient had a PCN reaction causing severe rash involving mucus membranes or skin necrosis: No Has patient had a PCN reaction that required hospitalization: No Has patient had a PCN reaction occurring within the last 10 years: No.   . Strawberry Extract Swelling    SWELLING REACTION UNSPECIFIED   . Clonidine Nausea And Vomiting  . Sulfa Antibiotics Rash and Other (See Comments)    Rib pain    Review of Systems: As listed above, otherwise negative.   Physical Examination  Vitals:   09/27/16 1135 10/31/16 1520 11/04/16 0731  Weight: 160 lb (72.6 kg) 170 lb (77.1  kg) 170 lb (77.1 kg)  Height: 5' 3.5" (1.613 m) 5\' 3"  (1.6 m) 5\' 3"  (1.6 m)    General: A&O x 3, WDWN  Pulmonary: Sym exp, good air movt, CTAB, no rales, rhonchi, & wheezing  Cardiac: RRR, Nl S1, S2, no Murmurs, rubs or gallops  Musculoskeletal: palpable thrill in Left arm, faintly palpable radial pulse  Laboratory   CBC CBC Latest Ref Rng & Units 09/13/2016 09/12/2016 09/11/2016  WBC 4.0 - 10.5 K/uL 13.2(H) 13.2(H) 12.1(H)  Hemoglobin 12.0 - 15.0 g/dL 11.0(L) 11.9(L) 12.4  Hematocrit 36.0 - 46.0 % 35.0(L) 36.7 37.4  Platelets 150 - 400 K/uL 195 174 209    BMP BMP Latest Ref Rng & Units 09/13/2016 09/12/2016 09/11/2016  Glucose 65 - 99 mg/dL 103(H) 138(H) 118(H)  BUN 6 - 20 mg/dL 17 31(H) 29(H)  Creatinine 0.44 - 1.00 mg/dL 3.67(H) 5.65(H) 5.24(H)  Sodium 135 - 145 mmol/L 131(L) 137 138  Potassium 3.5 - 5.1 mmol/L 3.7 4.6 4.1  Chloride 101 - 111 mmol/L 94(L) 96(L) 98(L)  CO2 22 - 32 mmol/L 26 27 26   Calcium 8.9 - 10.3 mg/dL 8.3(L) 9.1 9.3    Coagulation Lab Results  Component Value Date   INR 1.07 06/23/2016   No results found for: PTT  Lipids No results found for: CHOL, TRIG, HDL, CHOLHDL, VLDL, LDLCALC, LDLDIRECT   Medical Decision Making  Alison Baker is a 61 y.o. female who presents with: ESRD, matured left first stage brachial vein transposition.   The patient is scheduled for: left second stage brachial vein transposition.  Risk, benefits, and alternatives to access surgery were discussed.  The patient is aware the risks include but are not limited to: bleeding, infection, steal syndrome, nerve damage, ischemic monomelic neuropathy, failure to mature, and need for additional procedures.  The patient is aware of the risks and agrees to proceed.   Adele Barthel, MD, FACS [facility] Office: [phone] Pager: [phone]  11/04/2016, 7:33 AM

## 2016-11-05 ENCOUNTER — Encounter (HOSPITAL_COMMUNITY): Payer: Self-pay | Admitting: Vascular Surgery

## 2016-11-05 DIAGNOSIS — N186 End stage renal disease: Secondary | ICD-10-CM | POA: Diagnosis not present

## 2016-11-06 DIAGNOSIS — I132 Hypertensive heart and chronic kidney disease with heart failure and with stage 5 chronic kidney disease, or end stage renal disease: Secondary | ICD-10-CM | POA: Diagnosis not present

## 2016-11-06 DIAGNOSIS — I509 Heart failure, unspecified: Secondary | ICD-10-CM | POA: Diagnosis not present

## 2016-11-06 DIAGNOSIS — M545 Low back pain: Secondary | ICD-10-CM | POA: Diagnosis not present

## 2016-11-06 DIAGNOSIS — Z87891 Personal history of nicotine dependence: Secondary | ICD-10-CM | POA: Diagnosis not present

## 2016-11-06 DIAGNOSIS — N186 End stage renal disease: Secondary | ICD-10-CM | POA: Diagnosis not present

## 2016-11-06 DIAGNOSIS — Z992 Dependence on renal dialysis: Secondary | ICD-10-CM | POA: Diagnosis not present

## 2016-11-06 DIAGNOSIS — D631 Anemia in chronic kidney disease: Secondary | ICD-10-CM | POA: Diagnosis not present

## 2016-11-07 DIAGNOSIS — N186 End stage renal disease: Secondary | ICD-10-CM | POA: Diagnosis not present

## 2016-11-10 DIAGNOSIS — N186 End stage renal disease: Secondary | ICD-10-CM | POA: Diagnosis not present

## 2016-11-11 DIAGNOSIS — D631 Anemia in chronic kidney disease: Secondary | ICD-10-CM | POA: Diagnosis not present

## 2016-11-11 DIAGNOSIS — M545 Low back pain: Secondary | ICD-10-CM | POA: Diagnosis not present

## 2016-11-11 DIAGNOSIS — Z992 Dependence on renal dialysis: Secondary | ICD-10-CM | POA: Diagnosis not present

## 2016-11-11 DIAGNOSIS — Z87891 Personal history of nicotine dependence: Secondary | ICD-10-CM | POA: Diagnosis not present

## 2016-11-11 DIAGNOSIS — I132 Hypertensive heart and chronic kidney disease with heart failure and with stage 5 chronic kidney disease, or end stage renal disease: Secondary | ICD-10-CM | POA: Diagnosis not present

## 2016-11-11 DIAGNOSIS — I509 Heart failure, unspecified: Secondary | ICD-10-CM | POA: Diagnosis not present

## 2016-11-11 DIAGNOSIS — N186 End stage renal disease: Secondary | ICD-10-CM | POA: Diagnosis not present

## 2016-11-12 DIAGNOSIS — N186 End stage renal disease: Secondary | ICD-10-CM | POA: Diagnosis not present

## 2016-11-12 DIAGNOSIS — M9903 Segmental and somatic dysfunction of lumbar region: Secondary | ICD-10-CM | POA: Diagnosis not present

## 2016-11-12 DIAGNOSIS — M5441 Lumbago with sciatica, right side: Secondary | ICD-10-CM | POA: Diagnosis not present

## 2016-11-12 DIAGNOSIS — M9904 Segmental and somatic dysfunction of sacral region: Secondary | ICD-10-CM | POA: Diagnosis not present

## 2016-11-12 DIAGNOSIS — M9905 Segmental and somatic dysfunction of pelvic region: Secondary | ICD-10-CM | POA: Diagnosis not present

## 2016-11-13 DIAGNOSIS — I132 Hypertensive heart and chronic kidney disease with heart failure and with stage 5 chronic kidney disease, or end stage renal disease: Secondary | ICD-10-CM | POA: Diagnosis not present

## 2016-11-13 DIAGNOSIS — N186 End stage renal disease: Secondary | ICD-10-CM | POA: Diagnosis not present

## 2016-11-13 DIAGNOSIS — M545 Low back pain: Secondary | ICD-10-CM | POA: Diagnosis not present

## 2016-11-13 DIAGNOSIS — Z87891 Personal history of nicotine dependence: Secondary | ICD-10-CM | POA: Diagnosis not present

## 2016-11-13 DIAGNOSIS — D631 Anemia in chronic kidney disease: Secondary | ICD-10-CM | POA: Diagnosis not present

## 2016-11-13 DIAGNOSIS — I509 Heart failure, unspecified: Secondary | ICD-10-CM | POA: Diagnosis not present

## 2016-11-13 DIAGNOSIS — Z992 Dependence on renal dialysis: Secondary | ICD-10-CM | POA: Diagnosis not present

## 2016-11-14 DIAGNOSIS — M9904 Segmental and somatic dysfunction of sacral region: Secondary | ICD-10-CM | POA: Diagnosis not present

## 2016-11-14 DIAGNOSIS — M9903 Segmental and somatic dysfunction of lumbar region: Secondary | ICD-10-CM | POA: Diagnosis not present

## 2016-11-14 DIAGNOSIS — M5441 Lumbago with sciatica, right side: Secondary | ICD-10-CM | POA: Diagnosis not present

## 2016-11-14 DIAGNOSIS — M9905 Segmental and somatic dysfunction of pelvic region: Secondary | ICD-10-CM | POA: Diagnosis not present

## 2016-11-14 DIAGNOSIS — N186 End stage renal disease: Secondary | ICD-10-CM | POA: Diagnosis not present

## 2016-11-18 DIAGNOSIS — S32010A Wedge compression fracture of first lumbar vertebra, initial encounter for closed fracture: Secondary | ICD-10-CM | POA: Diagnosis not present

## 2016-11-18 DIAGNOSIS — M545 Low back pain: Secondary | ICD-10-CM | POA: Diagnosis not present

## 2016-11-19 DIAGNOSIS — N186 End stage renal disease: Secondary | ICD-10-CM | POA: Diagnosis not present

## 2016-11-19 DIAGNOSIS — Z23 Encounter for immunization: Secondary | ICD-10-CM | POA: Diagnosis not present

## 2016-11-21 DIAGNOSIS — N186 End stage renal disease: Secondary | ICD-10-CM | POA: Diagnosis not present

## 2016-11-21 DIAGNOSIS — Z23 Encounter for immunization: Secondary | ICD-10-CM | POA: Diagnosis not present

## 2016-11-24 DIAGNOSIS — N186 End stage renal disease: Secondary | ICD-10-CM | POA: Diagnosis not present

## 2016-11-25 ENCOUNTER — Telehealth (HOSPITAL_COMMUNITY): Payer: Self-pay

## 2016-11-25 DIAGNOSIS — I129 Hypertensive chronic kidney disease with stage 1 through stage 4 chronic kidney disease, or unspecified chronic kidney disease: Secondary | ICD-10-CM | POA: Diagnosis not present

## 2016-11-25 DIAGNOSIS — Z992 Dependence on renal dialysis: Secondary | ICD-10-CM | POA: Diagnosis not present

## 2016-11-25 DIAGNOSIS — N186 End stage renal disease: Secondary | ICD-10-CM | POA: Diagnosis not present

## 2016-11-25 NOTE — Telephone Encounter (Signed)
Pt will call back after she speaks to her husband. She has a lot going on so she is not sure if she wants to schedule. AW

## 2016-11-26 DIAGNOSIS — N2581 Secondary hyperparathyroidism of renal origin: Secondary | ICD-10-CM | POA: Diagnosis not present

## 2016-11-26 DIAGNOSIS — N186 End stage renal disease: Secondary | ICD-10-CM | POA: Diagnosis not present

## 2016-11-27 DIAGNOSIS — R300 Dysuria: Secondary | ICD-10-CM | POA: Diagnosis not present

## 2016-11-28 DIAGNOSIS — N186 End stage renal disease: Secondary | ICD-10-CM | POA: Diagnosis not present

## 2016-11-28 DIAGNOSIS — N2581 Secondary hyperparathyroidism of renal origin: Secondary | ICD-10-CM | POA: Diagnosis not present

## 2016-12-01 DIAGNOSIS — N2581 Secondary hyperparathyroidism of renal origin: Secondary | ICD-10-CM | POA: Diagnosis not present

## 2016-12-01 DIAGNOSIS — N186 End stage renal disease: Secondary | ICD-10-CM | POA: Diagnosis not present

## 2016-12-02 ENCOUNTER — Encounter: Payer: Self-pay | Admitting: Vascular Surgery

## 2016-12-03 DIAGNOSIS — N2581 Secondary hyperparathyroidism of renal origin: Secondary | ICD-10-CM | POA: Diagnosis not present

## 2016-12-03 DIAGNOSIS — N186 End stage renal disease: Secondary | ICD-10-CM | POA: Diagnosis not present

## 2016-12-04 NOTE — Progress Notes (Signed)
    Postoperative Access Visit   History of Present Illness   Alison Baker is a 61 y.o. year old female who presents for postoperative follow-up for: left second stage brachial vein transposition (Date: 11/04/16).  The patient's wounds are healed.  The patient notes no steal symptoms.  The patient is able to complete their activities of daily living.  The patient's current symptoms are: none.   Physical Examination   Vitals:   12/10/16 0913  BP: (!) 184/83  Pulse: 75  Resp: 16  Temp: 97.6 F (36.4 C)  TempSrc: Oral  SpO2: 100%  Weight: 170 lb 1.6 oz (77.2 kg)  Height: 5' 2.5" (1.588 m)    LUE: Incision is healed, skin feels warm, hand grip is 5/5, sensation in digits is intact, palpable thrill, bruit can be auscultated    Medical Decision Making   Alison Baker is a 61 y.o. year old female who presents s/p left second stage brachial vein transposition   The patient's access is  ready for use.  The patient's tunneled dialysis catheter can be removed after two successful cannulations and completed dialysis treatments.  Thank you for allowing [place] to participate in this patient's care.   Adele Barthel, MD, FACS [facility] Office: [phone] Pager: [phone]

## 2016-12-05 DIAGNOSIS — N2581 Secondary hyperparathyroidism of renal origin: Secondary | ICD-10-CM | POA: Diagnosis not present

## 2016-12-05 DIAGNOSIS — N186 End stage renal disease: Secondary | ICD-10-CM | POA: Diagnosis not present

## 2016-12-08 DIAGNOSIS — N2581 Secondary hyperparathyroidism of renal origin: Secondary | ICD-10-CM | POA: Diagnosis not present

## 2016-12-08 DIAGNOSIS — N186 End stage renal disease: Secondary | ICD-10-CM | POA: Diagnosis not present

## 2016-12-10 ENCOUNTER — Ambulatory Visit (INDEPENDENT_AMBULATORY_CARE_PROVIDER_SITE_OTHER): Payer: Self-pay | Admitting: Vascular Surgery

## 2016-12-10 ENCOUNTER — Encounter: Payer: Self-pay | Admitting: Vascular Surgery

## 2016-12-10 VITALS — BP 184/83 | HR 75 | Temp 97.6°F | Resp 16 | Ht 62.5 in | Wt 170.1 lb

## 2016-12-10 DIAGNOSIS — Z992 Dependence on renal dialysis: Secondary | ICD-10-CM

## 2016-12-10 DIAGNOSIS — N186 End stage renal disease: Secondary | ICD-10-CM

## 2016-12-10 DIAGNOSIS — N2581 Secondary hyperparathyroidism of renal origin: Secondary | ICD-10-CM | POA: Diagnosis not present

## 2016-12-11 DIAGNOSIS — H4311 Vitreous hemorrhage, right eye: Secondary | ICD-10-CM | POA: Diagnosis not present

## 2016-12-12 DIAGNOSIS — N2581 Secondary hyperparathyroidism of renal origin: Secondary | ICD-10-CM | POA: Diagnosis not present

## 2016-12-12 DIAGNOSIS — N186 End stage renal disease: Secondary | ICD-10-CM | POA: Diagnosis not present

## 2016-12-15 DIAGNOSIS — N2581 Secondary hyperparathyroidism of renal origin: Secondary | ICD-10-CM | POA: Diagnosis not present

## 2016-12-15 DIAGNOSIS — N186 End stage renal disease: Secondary | ICD-10-CM | POA: Diagnosis not present

## 2016-12-17 DIAGNOSIS — N2581 Secondary hyperparathyroidism of renal origin: Secondary | ICD-10-CM | POA: Diagnosis not present

## 2016-12-17 DIAGNOSIS — N186 End stage renal disease: Secondary | ICD-10-CM | POA: Diagnosis not present

## 2016-12-19 DIAGNOSIS — N2581 Secondary hyperparathyroidism of renal origin: Secondary | ICD-10-CM | POA: Diagnosis not present

## 2016-12-19 DIAGNOSIS — N186 End stage renal disease: Secondary | ICD-10-CM | POA: Diagnosis not present

## 2016-12-22 DIAGNOSIS — N2581 Secondary hyperparathyroidism of renal origin: Secondary | ICD-10-CM | POA: Diagnosis not present

## 2016-12-22 DIAGNOSIS — N186 End stage renal disease: Secondary | ICD-10-CM | POA: Diagnosis not present

## 2016-12-23 DIAGNOSIS — Z1212 Encounter for screening for malignant neoplasm of rectum: Secondary | ICD-10-CM | POA: Diagnosis not present

## 2016-12-23 DIAGNOSIS — Z1389 Encounter for screening for other disorder: Secondary | ICD-10-CM | POA: Diagnosis not present

## 2016-12-23 DIAGNOSIS — Z1231 Encounter for screening mammogram for malignant neoplasm of breast: Secondary | ICD-10-CM | POA: Diagnosis not present

## 2016-12-23 DIAGNOSIS — Z1211 Encounter for screening for malignant neoplasm of colon: Secondary | ICD-10-CM | POA: Diagnosis not present

## 2016-12-23 DIAGNOSIS — Z0000 Encounter for general adult medical examination without abnormal findings: Secondary | ICD-10-CM | POA: Diagnosis not present

## 2016-12-24 DIAGNOSIS — N186 End stage renal disease: Secondary | ICD-10-CM | POA: Diagnosis not present

## 2016-12-24 DIAGNOSIS — N2581 Secondary hyperparathyroidism of renal origin: Secondary | ICD-10-CM | POA: Diagnosis not present

## 2016-12-26 DIAGNOSIS — N2581 Secondary hyperparathyroidism of renal origin: Secondary | ICD-10-CM | POA: Diagnosis not present

## 2016-12-26 DIAGNOSIS — I129 Hypertensive chronic kidney disease with stage 1 through stage 4 chronic kidney disease, or unspecified chronic kidney disease: Secondary | ICD-10-CM | POA: Diagnosis not present

## 2016-12-26 DIAGNOSIS — N186 End stage renal disease: Secondary | ICD-10-CM | POA: Diagnosis not present

## 2016-12-26 DIAGNOSIS — Z992 Dependence on renal dialysis: Secondary | ICD-10-CM | POA: Diagnosis not present

## 2016-12-29 DIAGNOSIS — N186 End stage renal disease: Secondary | ICD-10-CM | POA: Diagnosis not present

## 2016-12-29 DIAGNOSIS — N2581 Secondary hyperparathyroidism of renal origin: Secondary | ICD-10-CM | POA: Diagnosis not present

## 2016-12-31 DIAGNOSIS — N2581 Secondary hyperparathyroidism of renal origin: Secondary | ICD-10-CM | POA: Diagnosis not present

## 2016-12-31 DIAGNOSIS — N186 End stage renal disease: Secondary | ICD-10-CM | POA: Diagnosis not present

## 2017-01-02 DIAGNOSIS — N2581 Secondary hyperparathyroidism of renal origin: Secondary | ICD-10-CM | POA: Diagnosis not present

## 2017-01-02 DIAGNOSIS — N186 End stage renal disease: Secondary | ICD-10-CM | POA: Diagnosis not present

## 2017-01-05 DIAGNOSIS — N186 End stage renal disease: Secondary | ICD-10-CM | POA: Diagnosis not present

## 2017-01-05 DIAGNOSIS — N2581 Secondary hyperparathyroidism of renal origin: Secondary | ICD-10-CM | POA: Diagnosis not present

## 2017-01-07 DIAGNOSIS — N186 End stage renal disease: Secondary | ICD-10-CM | POA: Diagnosis not present

## 2017-01-07 DIAGNOSIS — N2581 Secondary hyperparathyroidism of renal origin: Secondary | ICD-10-CM | POA: Diagnosis not present

## 2017-01-09 DIAGNOSIS — N186 End stage renal disease: Secondary | ICD-10-CM | POA: Diagnosis not present

## 2017-01-09 DIAGNOSIS — N2581 Secondary hyperparathyroidism of renal origin: Secondary | ICD-10-CM | POA: Diagnosis not present

## 2017-01-12 DIAGNOSIS — N2581 Secondary hyperparathyroidism of renal origin: Secondary | ICD-10-CM | POA: Diagnosis not present

## 2017-01-12 DIAGNOSIS — N186 End stage renal disease: Secondary | ICD-10-CM | POA: Diagnosis not present

## 2017-01-13 DIAGNOSIS — I871 Compression of vein: Secondary | ICD-10-CM | POA: Diagnosis not present

## 2017-01-13 DIAGNOSIS — N186 End stage renal disease: Secondary | ICD-10-CM | POA: Diagnosis not present

## 2017-01-13 DIAGNOSIS — Z992 Dependence on renal dialysis: Secondary | ICD-10-CM | POA: Diagnosis not present

## 2017-01-13 DIAGNOSIS — T82858A Stenosis of vascular prosthetic devices, implants and grafts, initial encounter: Secondary | ICD-10-CM | POA: Diagnosis not present

## 2017-01-14 DIAGNOSIS — N186 End stage renal disease: Secondary | ICD-10-CM | POA: Diagnosis not present

## 2017-01-14 DIAGNOSIS — N2581 Secondary hyperparathyroidism of renal origin: Secondary | ICD-10-CM | POA: Diagnosis not present

## 2017-01-16 DIAGNOSIS — N186 End stage renal disease: Secondary | ICD-10-CM | POA: Diagnosis not present

## 2017-01-16 DIAGNOSIS — N2581 Secondary hyperparathyroidism of renal origin: Secondary | ICD-10-CM | POA: Diagnosis not present

## 2017-01-19 DIAGNOSIS — N2581 Secondary hyperparathyroidism of renal origin: Secondary | ICD-10-CM | POA: Diagnosis not present

## 2017-01-19 DIAGNOSIS — N186 End stage renal disease: Secondary | ICD-10-CM | POA: Diagnosis not present

## 2017-01-20 DIAGNOSIS — N186 End stage renal disease: Secondary | ICD-10-CM | POA: Diagnosis not present

## 2017-01-20 DIAGNOSIS — T82858A Stenosis of vascular prosthetic devices, implants and grafts, initial encounter: Secondary | ICD-10-CM | POA: Diagnosis not present

## 2017-01-20 DIAGNOSIS — I871 Compression of vein: Secondary | ICD-10-CM | POA: Diagnosis not present

## 2017-01-20 DIAGNOSIS — Z992 Dependence on renal dialysis: Secondary | ICD-10-CM | POA: Diagnosis not present

## 2017-01-21 DIAGNOSIS — N186 End stage renal disease: Secondary | ICD-10-CM | POA: Diagnosis not present

## 2017-01-21 DIAGNOSIS — N2581 Secondary hyperparathyroidism of renal origin: Secondary | ICD-10-CM | POA: Diagnosis not present

## 2017-01-22 DIAGNOSIS — I132 Hypertensive heart and chronic kidney disease with heart failure and with stage 5 chronic kidney disease, or end stage renal disease: Secondary | ICD-10-CM | POA: Diagnosis not present

## 2017-01-22 DIAGNOSIS — M545 Low back pain: Secondary | ICD-10-CM | POA: Diagnosis not present

## 2017-01-22 DIAGNOSIS — N185 Chronic kidney disease, stage 5: Secondary | ICD-10-CM | POA: Diagnosis not present

## 2017-01-23 DIAGNOSIS — N186 End stage renal disease: Secondary | ICD-10-CM | POA: Diagnosis not present

## 2017-01-23 DIAGNOSIS — N2581 Secondary hyperparathyroidism of renal origin: Secondary | ICD-10-CM | POA: Diagnosis not present

## 2017-01-25 DIAGNOSIS — I129 Hypertensive chronic kidney disease with stage 1 through stage 4 chronic kidney disease, or unspecified chronic kidney disease: Secondary | ICD-10-CM | POA: Diagnosis not present

## 2017-01-25 DIAGNOSIS — Z992 Dependence on renal dialysis: Secondary | ICD-10-CM | POA: Diagnosis not present

## 2017-01-25 DIAGNOSIS — N186 End stage renal disease: Secondary | ICD-10-CM | POA: Diagnosis not present

## 2017-01-26 DIAGNOSIS — S32010A Wedge compression fracture of first lumbar vertebra, initial encounter for closed fracture: Secondary | ICD-10-CM | POA: Diagnosis not present

## 2017-01-26 DIAGNOSIS — N2581 Secondary hyperparathyroidism of renal origin: Secondary | ICD-10-CM | POA: Diagnosis not present

## 2017-01-26 DIAGNOSIS — N186 End stage renal disease: Secondary | ICD-10-CM | POA: Diagnosis not present

## 2017-01-26 DIAGNOSIS — Z23 Encounter for immunization: Secondary | ICD-10-CM | POA: Diagnosis not present

## 2017-01-28 DIAGNOSIS — N186 End stage renal disease: Secondary | ICD-10-CM | POA: Diagnosis not present

## 2017-01-28 DIAGNOSIS — N2581 Secondary hyperparathyroidism of renal origin: Secondary | ICD-10-CM | POA: Diagnosis not present

## 2017-01-28 DIAGNOSIS — Z23 Encounter for immunization: Secondary | ICD-10-CM | POA: Diagnosis not present

## 2017-01-30 DIAGNOSIS — Z23 Encounter for immunization: Secondary | ICD-10-CM | POA: Diagnosis not present

## 2017-01-30 DIAGNOSIS — N2581 Secondary hyperparathyroidism of renal origin: Secondary | ICD-10-CM | POA: Diagnosis not present

## 2017-01-30 DIAGNOSIS — N186 End stage renal disease: Secondary | ICD-10-CM | POA: Diagnosis not present

## 2017-02-02 DIAGNOSIS — N2581 Secondary hyperparathyroidism of renal origin: Secondary | ICD-10-CM | POA: Diagnosis not present

## 2017-02-02 DIAGNOSIS — N186 End stage renal disease: Secondary | ICD-10-CM | POA: Diagnosis not present

## 2017-02-04 DIAGNOSIS — N2581 Secondary hyperparathyroidism of renal origin: Secondary | ICD-10-CM | POA: Diagnosis not present

## 2017-02-04 DIAGNOSIS — N186 End stage renal disease: Secondary | ICD-10-CM | POA: Diagnosis not present

## 2017-02-05 DIAGNOSIS — H4311 Vitreous hemorrhage, right eye: Secondary | ICD-10-CM | POA: Diagnosis not present

## 2017-02-06 DIAGNOSIS — N186 End stage renal disease: Secondary | ICD-10-CM | POA: Diagnosis not present

## 2017-02-06 DIAGNOSIS — N2581 Secondary hyperparathyroidism of renal origin: Secondary | ICD-10-CM | POA: Diagnosis not present

## 2017-02-09 DIAGNOSIS — N186 End stage renal disease: Secondary | ICD-10-CM | POA: Diagnosis not present

## 2017-02-09 DIAGNOSIS — N2581 Secondary hyperparathyroidism of renal origin: Secondary | ICD-10-CM | POA: Diagnosis not present

## 2017-02-10 DIAGNOSIS — H4311 Vitreous hemorrhage, right eye: Secondary | ICD-10-CM | POA: Diagnosis not present

## 2017-02-13 DIAGNOSIS — N186 End stage renal disease: Secondary | ICD-10-CM | POA: Diagnosis not present

## 2017-02-13 DIAGNOSIS — N2581 Secondary hyperparathyroidism of renal origin: Secondary | ICD-10-CM | POA: Diagnosis not present

## 2017-02-16 DIAGNOSIS — N186 End stage renal disease: Secondary | ICD-10-CM | POA: Diagnosis not present

## 2017-02-16 DIAGNOSIS — N2581 Secondary hyperparathyroidism of renal origin: Secondary | ICD-10-CM | POA: Diagnosis not present

## 2017-02-18 DIAGNOSIS — N186 End stage renal disease: Secondary | ICD-10-CM | POA: Diagnosis not present

## 2017-02-18 DIAGNOSIS — N2581 Secondary hyperparathyroidism of renal origin: Secondary | ICD-10-CM | POA: Diagnosis not present

## 2017-02-20 DIAGNOSIS — N186 End stage renal disease: Secondary | ICD-10-CM | POA: Diagnosis not present

## 2017-02-20 DIAGNOSIS — N2581 Secondary hyperparathyroidism of renal origin: Secondary | ICD-10-CM | POA: Diagnosis not present

## 2017-02-23 DIAGNOSIS — N186 End stage renal disease: Secondary | ICD-10-CM | POA: Diagnosis not present

## 2017-02-23 DIAGNOSIS — N2581 Secondary hyperparathyroidism of renal origin: Secondary | ICD-10-CM | POA: Diagnosis not present

## 2017-02-25 DIAGNOSIS — N2581 Secondary hyperparathyroidism of renal origin: Secondary | ICD-10-CM | POA: Diagnosis not present

## 2017-02-25 DIAGNOSIS — Z992 Dependence on renal dialysis: Secondary | ICD-10-CM | POA: Diagnosis not present

## 2017-02-25 DIAGNOSIS — I129 Hypertensive chronic kidney disease with stage 1 through stage 4 chronic kidney disease, or unspecified chronic kidney disease: Secondary | ICD-10-CM | POA: Diagnosis not present

## 2017-02-25 DIAGNOSIS — N186 End stage renal disease: Secondary | ICD-10-CM | POA: Diagnosis not present

## 2017-02-27 DIAGNOSIS — N186 End stage renal disease: Secondary | ICD-10-CM | POA: Diagnosis not present

## 2017-02-27 DIAGNOSIS — N2581 Secondary hyperparathyroidism of renal origin: Secondary | ICD-10-CM | POA: Diagnosis not present

## 2017-03-02 DIAGNOSIS — N186 End stage renal disease: Secondary | ICD-10-CM | POA: Diagnosis not present

## 2017-03-02 DIAGNOSIS — N2581 Secondary hyperparathyroidism of renal origin: Secondary | ICD-10-CM | POA: Diagnosis not present

## 2017-03-04 DIAGNOSIS — N186 End stage renal disease: Secondary | ICD-10-CM | POA: Diagnosis not present

## 2017-03-04 DIAGNOSIS — N2581 Secondary hyperparathyroidism of renal origin: Secondary | ICD-10-CM | POA: Diagnosis not present

## 2017-03-06 DIAGNOSIS — N2581 Secondary hyperparathyroidism of renal origin: Secondary | ICD-10-CM | POA: Diagnosis not present

## 2017-03-06 DIAGNOSIS — N186 End stage renal disease: Secondary | ICD-10-CM | POA: Diagnosis not present

## 2017-03-09 DIAGNOSIS — N186 End stage renal disease: Secondary | ICD-10-CM | POA: Diagnosis not present

## 2017-03-09 DIAGNOSIS — N2581 Secondary hyperparathyroidism of renal origin: Secondary | ICD-10-CM | POA: Diagnosis not present

## 2017-03-11 DIAGNOSIS — N186 End stage renal disease: Secondary | ICD-10-CM | POA: Diagnosis not present

## 2017-03-11 DIAGNOSIS — N2581 Secondary hyperparathyroidism of renal origin: Secondary | ICD-10-CM | POA: Diagnosis not present

## 2017-03-13 DIAGNOSIS — N186 End stage renal disease: Secondary | ICD-10-CM | POA: Diagnosis not present

## 2017-03-13 DIAGNOSIS — N2581 Secondary hyperparathyroidism of renal origin: Secondary | ICD-10-CM | POA: Diagnosis not present

## 2017-03-16 DIAGNOSIS — N2581 Secondary hyperparathyroidism of renal origin: Secondary | ICD-10-CM | POA: Diagnosis not present

## 2017-03-16 DIAGNOSIS — N186 End stage renal disease: Secondary | ICD-10-CM | POA: Diagnosis not present

## 2017-03-18 DIAGNOSIS — N186 End stage renal disease: Secondary | ICD-10-CM | POA: Diagnosis not present

## 2017-03-18 DIAGNOSIS — N2581 Secondary hyperparathyroidism of renal origin: Secondary | ICD-10-CM | POA: Diagnosis not present

## 2017-03-20 DIAGNOSIS — N186 End stage renal disease: Secondary | ICD-10-CM | POA: Diagnosis not present

## 2017-03-20 DIAGNOSIS — N2581 Secondary hyperparathyroidism of renal origin: Secondary | ICD-10-CM | POA: Diagnosis not present

## 2017-03-23 DIAGNOSIS — N186 End stage renal disease: Secondary | ICD-10-CM | POA: Diagnosis not present

## 2017-03-23 DIAGNOSIS — Z23 Encounter for immunization: Secondary | ICD-10-CM | POA: Diagnosis not present

## 2017-03-23 DIAGNOSIS — N2581 Secondary hyperparathyroidism of renal origin: Secondary | ICD-10-CM | POA: Diagnosis not present

## 2017-03-25 DIAGNOSIS — N186 End stage renal disease: Secondary | ICD-10-CM | POA: Diagnosis not present

## 2017-03-25 DIAGNOSIS — N2581 Secondary hyperparathyroidism of renal origin: Secondary | ICD-10-CM | POA: Diagnosis not present

## 2017-03-25 DIAGNOSIS — Z23 Encounter for immunization: Secondary | ICD-10-CM | POA: Diagnosis not present

## 2017-03-26 DIAGNOSIS — E113591 Type 2 diabetes mellitus with proliferative diabetic retinopathy without macular edema, right eye: Secondary | ICD-10-CM | POA: Diagnosis not present

## 2017-03-27 DIAGNOSIS — I129 Hypertensive chronic kidney disease with stage 1 through stage 4 chronic kidney disease, or unspecified chronic kidney disease: Secondary | ICD-10-CM | POA: Diagnosis not present

## 2017-03-27 DIAGNOSIS — Z992 Dependence on renal dialysis: Secondary | ICD-10-CM | POA: Diagnosis not present

## 2017-03-27 DIAGNOSIS — N186 End stage renal disease: Secondary | ICD-10-CM | POA: Diagnosis not present

## 2017-03-27 DIAGNOSIS — Z23 Encounter for immunization: Secondary | ICD-10-CM | POA: Diagnosis not present

## 2017-03-27 DIAGNOSIS — N2581 Secondary hyperparathyroidism of renal origin: Secondary | ICD-10-CM | POA: Diagnosis not present

## 2017-03-30 DIAGNOSIS — N186 End stage renal disease: Secondary | ICD-10-CM | POA: Diagnosis not present

## 2017-03-30 DIAGNOSIS — N2581 Secondary hyperparathyroidism of renal origin: Secondary | ICD-10-CM | POA: Diagnosis not present

## 2017-04-01 DIAGNOSIS — N2581 Secondary hyperparathyroidism of renal origin: Secondary | ICD-10-CM | POA: Diagnosis not present

## 2017-04-01 DIAGNOSIS — N186 End stage renal disease: Secondary | ICD-10-CM | POA: Diagnosis not present

## 2017-04-02 DIAGNOSIS — H4313 Vitreous hemorrhage, bilateral: Secondary | ICD-10-CM | POA: Diagnosis not present

## 2017-04-02 DIAGNOSIS — Z961 Presence of intraocular lens: Secondary | ICD-10-CM | POA: Diagnosis not present

## 2017-04-03 DIAGNOSIS — N186 End stage renal disease: Secondary | ICD-10-CM | POA: Diagnosis not present

## 2017-04-03 DIAGNOSIS — N2581 Secondary hyperparathyroidism of renal origin: Secondary | ICD-10-CM | POA: Diagnosis not present

## 2017-04-06 DIAGNOSIS — N2581 Secondary hyperparathyroidism of renal origin: Secondary | ICD-10-CM | POA: Diagnosis not present

## 2017-04-06 DIAGNOSIS — N186 End stage renal disease: Secondary | ICD-10-CM | POA: Diagnosis not present

## 2017-04-08 DIAGNOSIS — N2581 Secondary hyperparathyroidism of renal origin: Secondary | ICD-10-CM | POA: Diagnosis not present

## 2017-04-08 DIAGNOSIS — N186 End stage renal disease: Secondary | ICD-10-CM | POA: Diagnosis not present

## 2017-04-10 DIAGNOSIS — N186 End stage renal disease: Secondary | ICD-10-CM | POA: Diagnosis not present

## 2017-04-10 DIAGNOSIS — N2581 Secondary hyperparathyroidism of renal origin: Secondary | ICD-10-CM | POA: Diagnosis not present

## 2017-04-13 DIAGNOSIS — N186 End stage renal disease: Secondary | ICD-10-CM | POA: Diagnosis not present

## 2017-04-13 DIAGNOSIS — N2581 Secondary hyperparathyroidism of renal origin: Secondary | ICD-10-CM | POA: Diagnosis not present

## 2017-04-15 DIAGNOSIS — N2581 Secondary hyperparathyroidism of renal origin: Secondary | ICD-10-CM | POA: Diagnosis not present

## 2017-04-15 DIAGNOSIS — N186 End stage renal disease: Secondary | ICD-10-CM | POA: Diagnosis not present

## 2017-04-16 DIAGNOSIS — T82858A Stenosis of vascular prosthetic devices, implants and grafts, initial encounter: Secondary | ICD-10-CM | POA: Diagnosis not present

## 2017-04-16 DIAGNOSIS — Z992 Dependence on renal dialysis: Secondary | ICD-10-CM | POA: Diagnosis not present

## 2017-04-16 DIAGNOSIS — I871 Compression of vein: Secondary | ICD-10-CM | POA: Diagnosis not present

## 2017-04-16 DIAGNOSIS — N186 End stage renal disease: Secondary | ICD-10-CM | POA: Diagnosis not present

## 2017-04-17 DIAGNOSIS — N2581 Secondary hyperparathyroidism of renal origin: Secondary | ICD-10-CM | POA: Diagnosis not present

## 2017-04-17 DIAGNOSIS — N186 End stage renal disease: Secondary | ICD-10-CM | POA: Diagnosis not present

## 2017-04-19 DIAGNOSIS — N186 End stage renal disease: Secondary | ICD-10-CM | POA: Diagnosis not present

## 2017-04-19 DIAGNOSIS — N2581 Secondary hyperparathyroidism of renal origin: Secondary | ICD-10-CM | POA: Diagnosis not present

## 2017-04-22 DIAGNOSIS — N2581 Secondary hyperparathyroidism of renal origin: Secondary | ICD-10-CM | POA: Diagnosis not present

## 2017-04-22 DIAGNOSIS — N186 End stage renal disease: Secondary | ICD-10-CM | POA: Diagnosis not present

## 2017-04-24 DIAGNOSIS — N2581 Secondary hyperparathyroidism of renal origin: Secondary | ICD-10-CM | POA: Diagnosis not present

## 2017-04-24 DIAGNOSIS — N186 End stage renal disease: Secondary | ICD-10-CM | POA: Diagnosis not present

## 2017-04-26 DIAGNOSIS — N186 End stage renal disease: Secondary | ICD-10-CM | POA: Diagnosis not present

## 2017-04-26 DIAGNOSIS — N2581 Secondary hyperparathyroidism of renal origin: Secondary | ICD-10-CM | POA: Diagnosis not present

## 2017-04-27 DIAGNOSIS — I129 Hypertensive chronic kidney disease with stage 1 through stage 4 chronic kidney disease, or unspecified chronic kidney disease: Secondary | ICD-10-CM | POA: Diagnosis not present

## 2017-04-27 DIAGNOSIS — N186 End stage renal disease: Secondary | ICD-10-CM | POA: Diagnosis not present

## 2017-04-27 DIAGNOSIS — Z992 Dependence on renal dialysis: Secondary | ICD-10-CM | POA: Diagnosis not present

## 2017-04-29 DIAGNOSIS — N2581 Secondary hyperparathyroidism of renal origin: Secondary | ICD-10-CM | POA: Diagnosis not present

## 2017-04-29 DIAGNOSIS — N186 End stage renal disease: Secondary | ICD-10-CM | POA: Diagnosis not present

## 2017-05-01 DIAGNOSIS — N2581 Secondary hyperparathyroidism of renal origin: Secondary | ICD-10-CM | POA: Diagnosis not present

## 2017-05-01 DIAGNOSIS — N186 End stage renal disease: Secondary | ICD-10-CM | POA: Diagnosis not present

## 2017-05-04 DIAGNOSIS — N2581 Secondary hyperparathyroidism of renal origin: Secondary | ICD-10-CM | POA: Diagnosis not present

## 2017-05-04 DIAGNOSIS — N186 End stage renal disease: Secondary | ICD-10-CM | POA: Diagnosis not present

## 2017-05-06 DIAGNOSIS — N186 End stage renal disease: Secondary | ICD-10-CM | POA: Diagnosis not present

## 2017-05-06 DIAGNOSIS — N2581 Secondary hyperparathyroidism of renal origin: Secondary | ICD-10-CM | POA: Diagnosis not present

## 2017-05-08 DIAGNOSIS — N186 End stage renal disease: Secondary | ICD-10-CM | POA: Diagnosis not present

## 2017-05-08 DIAGNOSIS — N2581 Secondary hyperparathyroidism of renal origin: Secondary | ICD-10-CM | POA: Diagnosis not present

## 2017-05-11 DIAGNOSIS — N2581 Secondary hyperparathyroidism of renal origin: Secondary | ICD-10-CM | POA: Diagnosis not present

## 2017-05-11 DIAGNOSIS — N186 End stage renal disease: Secondary | ICD-10-CM | POA: Diagnosis not present

## 2017-05-13 DIAGNOSIS — N2581 Secondary hyperparathyroidism of renal origin: Secondary | ICD-10-CM | POA: Diagnosis not present

## 2017-05-13 DIAGNOSIS — N186 End stage renal disease: Secondary | ICD-10-CM | POA: Diagnosis not present

## 2017-05-15 DIAGNOSIS — N186 End stage renal disease: Secondary | ICD-10-CM | POA: Diagnosis not present

## 2017-05-15 DIAGNOSIS — N2581 Secondary hyperparathyroidism of renal origin: Secondary | ICD-10-CM | POA: Diagnosis not present

## 2017-05-18 DIAGNOSIS — N2581 Secondary hyperparathyroidism of renal origin: Secondary | ICD-10-CM | POA: Diagnosis not present

## 2017-05-18 DIAGNOSIS — N186 End stage renal disease: Secondary | ICD-10-CM | POA: Diagnosis not present

## 2017-05-20 DIAGNOSIS — N186 End stage renal disease: Secondary | ICD-10-CM | POA: Diagnosis not present

## 2017-05-20 DIAGNOSIS — N2581 Secondary hyperparathyroidism of renal origin: Secondary | ICD-10-CM | POA: Diagnosis not present

## 2017-05-22 DIAGNOSIS — N186 End stage renal disease: Secondary | ICD-10-CM | POA: Diagnosis not present

## 2017-05-22 DIAGNOSIS — N2581 Secondary hyperparathyroidism of renal origin: Secondary | ICD-10-CM | POA: Diagnosis not present

## 2017-05-25 DIAGNOSIS — N2581 Secondary hyperparathyroidism of renal origin: Secondary | ICD-10-CM | POA: Diagnosis not present

## 2017-05-25 DIAGNOSIS — N186 End stage renal disease: Secondary | ICD-10-CM | POA: Diagnosis not present

## 2017-05-26 DIAGNOSIS — Z452 Encounter for adjustment and management of vascular access device: Secondary | ICD-10-CM | POA: Diagnosis not present

## 2017-05-27 DIAGNOSIS — N2581 Secondary hyperparathyroidism of renal origin: Secondary | ICD-10-CM | POA: Diagnosis not present

## 2017-05-27 DIAGNOSIS — N186 End stage renal disease: Secondary | ICD-10-CM | POA: Diagnosis not present

## 2017-05-28 DIAGNOSIS — Z992 Dependence on renal dialysis: Secondary | ICD-10-CM | POA: Diagnosis not present

## 2017-05-28 DIAGNOSIS — N186 End stage renal disease: Secondary | ICD-10-CM | POA: Diagnosis not present

## 2017-05-28 DIAGNOSIS — I129 Hypertensive chronic kidney disease with stage 1 through stage 4 chronic kidney disease, or unspecified chronic kidney disease: Secondary | ICD-10-CM | POA: Diagnosis not present

## 2017-05-29 DIAGNOSIS — I129 Hypertensive chronic kidney disease with stage 1 through stage 4 chronic kidney disease, or unspecified chronic kidney disease: Secondary | ICD-10-CM | POA: Diagnosis not present

## 2017-05-29 DIAGNOSIS — Z992 Dependence on renal dialysis: Secondary | ICD-10-CM | POA: Diagnosis not present

## 2017-05-29 DIAGNOSIS — N2581 Secondary hyperparathyroidism of renal origin: Secondary | ICD-10-CM | POA: Diagnosis not present

## 2017-05-29 DIAGNOSIS — N186 End stage renal disease: Secondary | ICD-10-CM | POA: Diagnosis not present

## 2017-06-01 DIAGNOSIS — N186 End stage renal disease: Secondary | ICD-10-CM | POA: Diagnosis not present

## 2017-06-01 DIAGNOSIS — N2581 Secondary hyperparathyroidism of renal origin: Secondary | ICD-10-CM | POA: Diagnosis not present

## 2017-06-04 DIAGNOSIS — N2581 Secondary hyperparathyroidism of renal origin: Secondary | ICD-10-CM | POA: Diagnosis not present

## 2017-06-04 DIAGNOSIS — N186 End stage renal disease: Secondary | ICD-10-CM | POA: Diagnosis not present

## 2017-06-05 DIAGNOSIS — N186 End stage renal disease: Secondary | ICD-10-CM | POA: Diagnosis not present

## 2017-06-05 DIAGNOSIS — N2581 Secondary hyperparathyroidism of renal origin: Secondary | ICD-10-CM | POA: Diagnosis not present

## 2017-06-08 DIAGNOSIS — N186 End stage renal disease: Secondary | ICD-10-CM | POA: Diagnosis not present

## 2017-06-08 DIAGNOSIS — N2581 Secondary hyperparathyroidism of renal origin: Secondary | ICD-10-CM | POA: Diagnosis not present

## 2017-06-10 DIAGNOSIS — N186 End stage renal disease: Secondary | ICD-10-CM | POA: Diagnosis not present

## 2017-06-10 DIAGNOSIS — N2581 Secondary hyperparathyroidism of renal origin: Secondary | ICD-10-CM | POA: Diagnosis not present

## 2017-06-11 DIAGNOSIS — E113593 Type 2 diabetes mellitus with proliferative diabetic retinopathy without macular edema, bilateral: Secondary | ICD-10-CM | POA: Diagnosis not present

## 2017-06-12 DIAGNOSIS — N2581 Secondary hyperparathyroidism of renal origin: Secondary | ICD-10-CM | POA: Diagnosis not present

## 2017-06-12 DIAGNOSIS — N186 End stage renal disease: Secondary | ICD-10-CM | POA: Diagnosis not present

## 2017-06-15 DIAGNOSIS — N186 End stage renal disease: Secondary | ICD-10-CM | POA: Diagnosis not present

## 2017-06-15 DIAGNOSIS — N2581 Secondary hyperparathyroidism of renal origin: Secondary | ICD-10-CM | POA: Diagnosis not present

## 2017-06-17 DIAGNOSIS — N2581 Secondary hyperparathyroidism of renal origin: Secondary | ICD-10-CM | POA: Diagnosis not present

## 2017-06-17 DIAGNOSIS — N186 End stage renal disease: Secondary | ICD-10-CM | POA: Diagnosis not present

## 2017-06-19 DIAGNOSIS — N186 End stage renal disease: Secondary | ICD-10-CM | POA: Diagnosis not present

## 2017-06-19 DIAGNOSIS — N2581 Secondary hyperparathyroidism of renal origin: Secondary | ICD-10-CM | POA: Diagnosis not present

## 2017-06-22 DIAGNOSIS — E875 Hyperkalemia: Secondary | ICD-10-CM | POA: Diagnosis not present

## 2017-06-23 DIAGNOSIS — Z992 Dependence on renal dialysis: Secondary | ICD-10-CM | POA: Diagnosis not present

## 2017-06-23 DIAGNOSIS — T82898A Other specified complication of vascular prosthetic devices, implants and grafts, initial encounter: Secondary | ICD-10-CM | POA: Diagnosis not present

## 2017-06-23 DIAGNOSIS — N186 End stage renal disease: Secondary | ICD-10-CM | POA: Diagnosis not present

## 2017-06-24 DIAGNOSIS — N2581 Secondary hyperparathyroidism of renal origin: Secondary | ICD-10-CM | POA: Diagnosis not present

## 2017-06-24 DIAGNOSIS — N186 End stage renal disease: Secondary | ICD-10-CM | POA: Diagnosis not present

## 2017-06-26 DIAGNOSIS — N2581 Secondary hyperparathyroidism of renal origin: Secondary | ICD-10-CM | POA: Diagnosis not present

## 2017-06-26 DIAGNOSIS — N186 End stage renal disease: Secondary | ICD-10-CM | POA: Diagnosis not present

## 2017-06-29 DIAGNOSIS — N186 End stage renal disease: Secondary | ICD-10-CM | POA: Diagnosis not present

## 2017-06-29 DIAGNOSIS — N2581 Secondary hyperparathyroidism of renal origin: Secondary | ICD-10-CM | POA: Diagnosis not present

## 2017-07-01 DIAGNOSIS — N2581 Secondary hyperparathyroidism of renal origin: Secondary | ICD-10-CM | POA: Diagnosis not present

## 2017-07-01 DIAGNOSIS — N186 End stage renal disease: Secondary | ICD-10-CM | POA: Diagnosis not present

## 2017-07-03 DIAGNOSIS — N186 End stage renal disease: Secondary | ICD-10-CM | POA: Diagnosis not present

## 2017-07-03 DIAGNOSIS — N2581 Secondary hyperparathyroidism of renal origin: Secondary | ICD-10-CM | POA: Diagnosis not present

## 2017-07-06 DIAGNOSIS — N2581 Secondary hyperparathyroidism of renal origin: Secondary | ICD-10-CM | POA: Diagnosis not present

## 2017-07-06 DIAGNOSIS — N186 End stage renal disease: Secondary | ICD-10-CM | POA: Diagnosis not present

## 2017-07-08 DIAGNOSIS — N186 End stage renal disease: Secondary | ICD-10-CM | POA: Diagnosis not present

## 2017-07-08 DIAGNOSIS — N2581 Secondary hyperparathyroidism of renal origin: Secondary | ICD-10-CM | POA: Diagnosis not present

## 2017-07-10 DIAGNOSIS — N186 End stage renal disease: Secondary | ICD-10-CM | POA: Diagnosis not present

## 2017-07-10 DIAGNOSIS — N2581 Secondary hyperparathyroidism of renal origin: Secondary | ICD-10-CM | POA: Diagnosis not present

## 2017-07-13 DIAGNOSIS — N2581 Secondary hyperparathyroidism of renal origin: Secondary | ICD-10-CM | POA: Diagnosis not present

## 2017-07-13 DIAGNOSIS — N186 End stage renal disease: Secondary | ICD-10-CM | POA: Diagnosis not present

## 2017-07-15 DIAGNOSIS — N2581 Secondary hyperparathyroidism of renal origin: Secondary | ICD-10-CM | POA: Diagnosis not present

## 2017-07-15 DIAGNOSIS — N186 End stage renal disease: Secondary | ICD-10-CM | POA: Diagnosis not present

## 2017-07-17 DIAGNOSIS — N186 End stage renal disease: Secondary | ICD-10-CM | POA: Diagnosis not present

## 2017-07-17 DIAGNOSIS — N2581 Secondary hyperparathyroidism of renal origin: Secondary | ICD-10-CM | POA: Diagnosis not present

## 2017-07-20 DIAGNOSIS — N2581 Secondary hyperparathyroidism of renal origin: Secondary | ICD-10-CM | POA: Diagnosis not present

## 2017-07-20 DIAGNOSIS — N186 End stage renal disease: Secondary | ICD-10-CM | POA: Diagnosis not present

## 2017-07-22 DIAGNOSIS — N2581 Secondary hyperparathyroidism of renal origin: Secondary | ICD-10-CM | POA: Diagnosis not present

## 2017-07-22 DIAGNOSIS — N186 End stage renal disease: Secondary | ICD-10-CM | POA: Diagnosis not present

## 2017-07-24 DIAGNOSIS — N186 End stage renal disease: Secondary | ICD-10-CM | POA: Diagnosis not present

## 2017-07-24 DIAGNOSIS — N2581 Secondary hyperparathyroidism of renal origin: Secondary | ICD-10-CM | POA: Diagnosis not present

## 2017-07-26 DIAGNOSIS — I129 Hypertensive chronic kidney disease with stage 1 through stage 4 chronic kidney disease, or unspecified chronic kidney disease: Secondary | ICD-10-CM | POA: Diagnosis not present

## 2017-07-26 DIAGNOSIS — N186 End stage renal disease: Secondary | ICD-10-CM | POA: Diagnosis not present

## 2017-07-26 DIAGNOSIS — Z992 Dependence on renal dialysis: Secondary | ICD-10-CM | POA: Diagnosis not present

## 2017-07-27 DIAGNOSIS — N2581 Secondary hyperparathyroidism of renal origin: Secondary | ICD-10-CM | POA: Diagnosis not present

## 2017-07-27 DIAGNOSIS — N186 End stage renal disease: Secondary | ICD-10-CM | POA: Diagnosis not present

## 2017-07-29 DIAGNOSIS — N186 End stage renal disease: Secondary | ICD-10-CM | POA: Diagnosis not present

## 2017-07-29 DIAGNOSIS — N2581 Secondary hyperparathyroidism of renal origin: Secondary | ICD-10-CM | POA: Diagnosis not present

## 2017-07-31 DIAGNOSIS — N186 End stage renal disease: Secondary | ICD-10-CM | POA: Diagnosis not present

## 2017-07-31 DIAGNOSIS — N2581 Secondary hyperparathyroidism of renal origin: Secondary | ICD-10-CM | POA: Diagnosis not present

## 2017-08-03 DIAGNOSIS — N186 End stage renal disease: Secondary | ICD-10-CM | POA: Diagnosis not present

## 2017-08-03 DIAGNOSIS — N2581 Secondary hyperparathyroidism of renal origin: Secondary | ICD-10-CM | POA: Diagnosis not present

## 2017-08-05 DIAGNOSIS — N186 End stage renal disease: Secondary | ICD-10-CM | POA: Diagnosis not present

## 2017-08-05 DIAGNOSIS — N2581 Secondary hyperparathyroidism of renal origin: Secondary | ICD-10-CM | POA: Diagnosis not present

## 2017-08-07 DIAGNOSIS — N2581 Secondary hyperparathyroidism of renal origin: Secondary | ICD-10-CM | POA: Diagnosis not present

## 2017-08-07 DIAGNOSIS — N186 End stage renal disease: Secondary | ICD-10-CM | POA: Diagnosis not present

## 2017-08-10 DIAGNOSIS — N2581 Secondary hyperparathyroidism of renal origin: Secondary | ICD-10-CM | POA: Diagnosis not present

## 2017-08-10 DIAGNOSIS — N186 End stage renal disease: Secondary | ICD-10-CM | POA: Diagnosis not present

## 2017-08-12 DIAGNOSIS — N2581 Secondary hyperparathyroidism of renal origin: Secondary | ICD-10-CM | POA: Diagnosis not present

## 2017-08-12 DIAGNOSIS — N186 End stage renal disease: Secondary | ICD-10-CM | POA: Diagnosis not present

## 2017-08-13 DIAGNOSIS — Z452 Encounter for adjustment and management of vascular access device: Secondary | ICD-10-CM | POA: Diagnosis not present

## 2017-08-14 DIAGNOSIS — N2581 Secondary hyperparathyroidism of renal origin: Secondary | ICD-10-CM | POA: Diagnosis not present

## 2017-08-14 DIAGNOSIS — N186 End stage renal disease: Secondary | ICD-10-CM | POA: Diagnosis not present

## 2017-08-17 DIAGNOSIS — N2581 Secondary hyperparathyroidism of renal origin: Secondary | ICD-10-CM | POA: Diagnosis not present

## 2017-08-17 DIAGNOSIS — N186 End stage renal disease: Secondary | ICD-10-CM | POA: Diagnosis not present

## 2017-08-19 DIAGNOSIS — N2581 Secondary hyperparathyroidism of renal origin: Secondary | ICD-10-CM | POA: Diagnosis not present

## 2017-08-19 DIAGNOSIS — N186 End stage renal disease: Secondary | ICD-10-CM | POA: Diagnosis not present

## 2017-08-21 DIAGNOSIS — N2581 Secondary hyperparathyroidism of renal origin: Secondary | ICD-10-CM | POA: Diagnosis not present

## 2017-08-21 DIAGNOSIS — N186 End stage renal disease: Secondary | ICD-10-CM | POA: Diagnosis not present

## 2017-08-24 DIAGNOSIS — N2581 Secondary hyperparathyroidism of renal origin: Secondary | ICD-10-CM | POA: Diagnosis not present

## 2017-08-24 DIAGNOSIS — N186 End stage renal disease: Secondary | ICD-10-CM | POA: Diagnosis not present

## 2017-08-25 DIAGNOSIS — N186 End stage renal disease: Secondary | ICD-10-CM | POA: Diagnosis not present

## 2017-08-25 DIAGNOSIS — I129 Hypertensive chronic kidney disease with stage 1 through stage 4 chronic kidney disease, or unspecified chronic kidney disease: Secondary | ICD-10-CM | POA: Diagnosis not present

## 2017-08-25 DIAGNOSIS — Z992 Dependence on renal dialysis: Secondary | ICD-10-CM | POA: Diagnosis not present

## 2017-08-26 DIAGNOSIS — N186 End stage renal disease: Secondary | ICD-10-CM | POA: Diagnosis not present

## 2017-08-26 DIAGNOSIS — N2581 Secondary hyperparathyroidism of renal origin: Secondary | ICD-10-CM | POA: Diagnosis not present

## 2017-08-28 DIAGNOSIS — N2581 Secondary hyperparathyroidism of renal origin: Secondary | ICD-10-CM | POA: Diagnosis not present

## 2017-08-28 DIAGNOSIS — N186 End stage renal disease: Secondary | ICD-10-CM | POA: Diagnosis not present

## 2017-08-31 DIAGNOSIS — N2581 Secondary hyperparathyroidism of renal origin: Secondary | ICD-10-CM | POA: Diagnosis not present

## 2017-08-31 DIAGNOSIS — N186 End stage renal disease: Secondary | ICD-10-CM | POA: Diagnosis not present

## 2017-09-02 DIAGNOSIS — N186 End stage renal disease: Secondary | ICD-10-CM | POA: Diagnosis not present

## 2017-09-02 DIAGNOSIS — N2581 Secondary hyperparathyroidism of renal origin: Secondary | ICD-10-CM | POA: Diagnosis not present

## 2017-09-04 DIAGNOSIS — N186 End stage renal disease: Secondary | ICD-10-CM | POA: Diagnosis not present

## 2017-09-04 DIAGNOSIS — N2581 Secondary hyperparathyroidism of renal origin: Secondary | ICD-10-CM | POA: Diagnosis not present

## 2017-09-07 DIAGNOSIS — R52 Pain, unspecified: Secondary | ICD-10-CM | POA: Diagnosis not present

## 2017-09-07 DIAGNOSIS — N2581 Secondary hyperparathyroidism of renal origin: Secondary | ICD-10-CM | POA: Diagnosis not present

## 2017-09-07 DIAGNOSIS — N186 End stage renal disease: Secondary | ICD-10-CM | POA: Diagnosis not present

## 2017-09-09 DIAGNOSIS — N186 End stage renal disease: Secondary | ICD-10-CM | POA: Diagnosis not present

## 2017-09-09 DIAGNOSIS — N2581 Secondary hyperparathyroidism of renal origin: Secondary | ICD-10-CM | POA: Diagnosis not present

## 2017-09-09 DIAGNOSIS — R52 Pain, unspecified: Secondary | ICD-10-CM | POA: Diagnosis not present

## 2017-09-11 DIAGNOSIS — N186 End stage renal disease: Secondary | ICD-10-CM | POA: Diagnosis not present

## 2017-09-11 DIAGNOSIS — N2581 Secondary hyperparathyroidism of renal origin: Secondary | ICD-10-CM | POA: Diagnosis not present

## 2017-09-11 DIAGNOSIS — R52 Pain, unspecified: Secondary | ICD-10-CM | POA: Diagnosis not present

## 2017-09-14 DIAGNOSIS — N186 End stage renal disease: Secondary | ICD-10-CM | POA: Diagnosis not present

## 2017-09-14 DIAGNOSIS — N2581 Secondary hyperparathyroidism of renal origin: Secondary | ICD-10-CM | POA: Diagnosis not present

## 2017-09-16 DIAGNOSIS — N186 End stage renal disease: Secondary | ICD-10-CM | POA: Diagnosis not present

## 2017-09-16 DIAGNOSIS — N2581 Secondary hyperparathyroidism of renal origin: Secondary | ICD-10-CM | POA: Diagnosis not present

## 2017-09-18 ENCOUNTER — Encounter: Payer: Self-pay | Admitting: Internal Medicine

## 2017-09-18 DIAGNOSIS — N2581 Secondary hyperparathyroidism of renal origin: Secondary | ICD-10-CM | POA: Diagnosis not present

## 2017-09-18 DIAGNOSIS — N186 End stage renal disease: Secondary | ICD-10-CM | POA: Diagnosis not present

## 2017-09-21 DIAGNOSIS — N186 End stage renal disease: Secondary | ICD-10-CM | POA: Diagnosis not present

## 2017-09-21 DIAGNOSIS — N2581 Secondary hyperparathyroidism of renal origin: Secondary | ICD-10-CM | POA: Diagnosis not present

## 2017-09-23 DIAGNOSIS — N2581 Secondary hyperparathyroidism of renal origin: Secondary | ICD-10-CM | POA: Diagnosis not present

## 2017-09-23 DIAGNOSIS — N186 End stage renal disease: Secondary | ICD-10-CM | POA: Diagnosis not present

## 2017-09-25 DIAGNOSIS — N2581 Secondary hyperparathyroidism of renal origin: Secondary | ICD-10-CM | POA: Diagnosis not present

## 2017-09-25 DIAGNOSIS — I129 Hypertensive chronic kidney disease with stage 1 through stage 4 chronic kidney disease, or unspecified chronic kidney disease: Secondary | ICD-10-CM | POA: Diagnosis not present

## 2017-09-25 DIAGNOSIS — N186 End stage renal disease: Secondary | ICD-10-CM | POA: Diagnosis not present

## 2017-09-25 DIAGNOSIS — Z992 Dependence on renal dialysis: Secondary | ICD-10-CM | POA: Diagnosis not present

## 2017-09-28 DIAGNOSIS — N186 End stage renal disease: Secondary | ICD-10-CM | POA: Diagnosis not present

## 2017-09-28 DIAGNOSIS — N2581 Secondary hyperparathyroidism of renal origin: Secondary | ICD-10-CM | POA: Diagnosis not present

## 2017-09-30 DIAGNOSIS — N2581 Secondary hyperparathyroidism of renal origin: Secondary | ICD-10-CM | POA: Diagnosis not present

## 2017-09-30 DIAGNOSIS — N186 End stage renal disease: Secondary | ICD-10-CM | POA: Diagnosis not present

## 2017-10-02 DIAGNOSIS — N186 End stage renal disease: Secondary | ICD-10-CM | POA: Diagnosis not present

## 2017-10-02 DIAGNOSIS — N2581 Secondary hyperparathyroidism of renal origin: Secondary | ICD-10-CM | POA: Diagnosis not present

## 2017-10-05 DIAGNOSIS — N186 End stage renal disease: Secondary | ICD-10-CM | POA: Diagnosis not present

## 2017-10-05 DIAGNOSIS — N2581 Secondary hyperparathyroidism of renal origin: Secondary | ICD-10-CM | POA: Diagnosis not present

## 2017-10-07 DIAGNOSIS — N2581 Secondary hyperparathyroidism of renal origin: Secondary | ICD-10-CM | POA: Diagnosis not present

## 2017-10-07 DIAGNOSIS — N186 End stage renal disease: Secondary | ICD-10-CM | POA: Diagnosis not present

## 2017-10-09 DIAGNOSIS — N2581 Secondary hyperparathyroidism of renal origin: Secondary | ICD-10-CM | POA: Diagnosis not present

## 2017-10-09 DIAGNOSIS — N186 End stage renal disease: Secondary | ICD-10-CM | POA: Diagnosis not present

## 2017-10-12 DIAGNOSIS — N2581 Secondary hyperparathyroidism of renal origin: Secondary | ICD-10-CM | POA: Diagnosis not present

## 2017-10-12 DIAGNOSIS — N186 End stage renal disease: Secondary | ICD-10-CM | POA: Diagnosis not present

## 2017-10-14 DIAGNOSIS — N2581 Secondary hyperparathyroidism of renal origin: Secondary | ICD-10-CM | POA: Diagnosis not present

## 2017-10-14 DIAGNOSIS — N186 End stage renal disease: Secondary | ICD-10-CM | POA: Diagnosis not present

## 2017-10-16 DIAGNOSIS — N2581 Secondary hyperparathyroidism of renal origin: Secondary | ICD-10-CM | POA: Diagnosis not present

## 2017-10-16 DIAGNOSIS — N186 End stage renal disease: Secondary | ICD-10-CM | POA: Diagnosis not present

## 2017-10-19 DIAGNOSIS — N186 End stage renal disease: Secondary | ICD-10-CM | POA: Diagnosis not present

## 2017-10-19 DIAGNOSIS — N2581 Secondary hyperparathyroidism of renal origin: Secondary | ICD-10-CM | POA: Diagnosis not present

## 2017-10-21 DIAGNOSIS — N2581 Secondary hyperparathyroidism of renal origin: Secondary | ICD-10-CM | POA: Diagnosis not present

## 2017-10-21 DIAGNOSIS — N186 End stage renal disease: Secondary | ICD-10-CM | POA: Diagnosis not present

## 2017-10-23 DIAGNOSIS — N2581 Secondary hyperparathyroidism of renal origin: Secondary | ICD-10-CM | POA: Diagnosis not present

## 2017-10-23 DIAGNOSIS — N186 End stage renal disease: Secondary | ICD-10-CM | POA: Diagnosis not present

## 2017-10-25 DIAGNOSIS — I129 Hypertensive chronic kidney disease with stage 1 through stage 4 chronic kidney disease, or unspecified chronic kidney disease: Secondary | ICD-10-CM | POA: Diagnosis not present

## 2017-10-25 DIAGNOSIS — N186 End stage renal disease: Secondary | ICD-10-CM | POA: Diagnosis not present

## 2017-10-25 DIAGNOSIS — Z992 Dependence on renal dialysis: Secondary | ICD-10-CM | POA: Diagnosis not present

## 2017-10-26 DIAGNOSIS — N2581 Secondary hyperparathyroidism of renal origin: Secondary | ICD-10-CM | POA: Diagnosis not present

## 2017-10-26 DIAGNOSIS — N186 End stage renal disease: Secondary | ICD-10-CM | POA: Diagnosis not present

## 2017-10-28 DIAGNOSIS — N186 End stage renal disease: Secondary | ICD-10-CM | POA: Diagnosis not present

## 2017-10-28 DIAGNOSIS — N2581 Secondary hyperparathyroidism of renal origin: Secondary | ICD-10-CM | POA: Diagnosis not present

## 2017-10-30 DIAGNOSIS — N2581 Secondary hyperparathyroidism of renal origin: Secondary | ICD-10-CM | POA: Diagnosis not present

## 2017-10-30 DIAGNOSIS — N186 End stage renal disease: Secondary | ICD-10-CM | POA: Diagnosis not present

## 2017-11-02 DIAGNOSIS — N186 End stage renal disease: Secondary | ICD-10-CM | POA: Diagnosis not present

## 2017-11-02 DIAGNOSIS — N2581 Secondary hyperparathyroidism of renal origin: Secondary | ICD-10-CM | POA: Diagnosis not present

## 2017-11-04 DIAGNOSIS — N2581 Secondary hyperparathyroidism of renal origin: Secondary | ICD-10-CM | POA: Diagnosis not present

## 2017-11-04 DIAGNOSIS — N186 End stage renal disease: Secondary | ICD-10-CM | POA: Diagnosis not present

## 2017-11-06 DIAGNOSIS — N186 End stage renal disease: Secondary | ICD-10-CM | POA: Diagnosis not present

## 2017-11-06 DIAGNOSIS — N2581 Secondary hyperparathyroidism of renal origin: Secondary | ICD-10-CM | POA: Diagnosis not present

## 2017-11-09 DIAGNOSIS — N186 End stage renal disease: Secondary | ICD-10-CM | POA: Diagnosis not present

## 2017-11-09 DIAGNOSIS — N2581 Secondary hyperparathyroidism of renal origin: Secondary | ICD-10-CM | POA: Diagnosis not present

## 2017-11-11 DIAGNOSIS — N2581 Secondary hyperparathyroidism of renal origin: Secondary | ICD-10-CM | POA: Diagnosis not present

## 2017-11-11 DIAGNOSIS — N186 End stage renal disease: Secondary | ICD-10-CM | POA: Diagnosis not present

## 2017-11-13 DIAGNOSIS — N186 End stage renal disease: Secondary | ICD-10-CM | POA: Diagnosis not present

## 2017-11-13 DIAGNOSIS — N2581 Secondary hyperparathyroidism of renal origin: Secondary | ICD-10-CM | POA: Diagnosis not present

## 2017-11-16 DIAGNOSIS — N2581 Secondary hyperparathyroidism of renal origin: Secondary | ICD-10-CM | POA: Diagnosis not present

## 2017-11-16 DIAGNOSIS — N186 End stage renal disease: Secondary | ICD-10-CM | POA: Diagnosis not present

## 2017-11-17 ENCOUNTER — Ambulatory Visit: Payer: BLUE CROSS/BLUE SHIELD | Admitting: Internal Medicine

## 2017-11-18 DIAGNOSIS — N186 End stage renal disease: Secondary | ICD-10-CM | POA: Diagnosis not present

## 2017-11-18 DIAGNOSIS — N2581 Secondary hyperparathyroidism of renal origin: Secondary | ICD-10-CM | POA: Diagnosis not present

## 2017-11-20 DIAGNOSIS — N186 End stage renal disease: Secondary | ICD-10-CM | POA: Diagnosis not present

## 2017-11-20 DIAGNOSIS — N2581 Secondary hyperparathyroidism of renal origin: Secondary | ICD-10-CM | POA: Diagnosis not present

## 2017-11-23 DIAGNOSIS — N2581 Secondary hyperparathyroidism of renal origin: Secondary | ICD-10-CM | POA: Diagnosis not present

## 2017-11-23 DIAGNOSIS — N186 End stage renal disease: Secondary | ICD-10-CM | POA: Diagnosis not present

## 2017-11-25 DIAGNOSIS — I129 Hypertensive chronic kidney disease with stage 1 through stage 4 chronic kidney disease, or unspecified chronic kidney disease: Secondary | ICD-10-CM | POA: Diagnosis not present

## 2017-11-25 DIAGNOSIS — N186 End stage renal disease: Secondary | ICD-10-CM | POA: Diagnosis not present

## 2017-11-25 DIAGNOSIS — N2581 Secondary hyperparathyroidism of renal origin: Secondary | ICD-10-CM | POA: Diagnosis not present

## 2017-11-25 DIAGNOSIS — Z992 Dependence on renal dialysis: Secondary | ICD-10-CM | POA: Diagnosis not present

## 2017-11-27 DIAGNOSIS — N186 End stage renal disease: Secondary | ICD-10-CM | POA: Diagnosis not present

## 2017-11-27 DIAGNOSIS — N2581 Secondary hyperparathyroidism of renal origin: Secondary | ICD-10-CM | POA: Diagnosis not present

## 2017-11-30 DIAGNOSIS — N2581 Secondary hyperparathyroidism of renal origin: Secondary | ICD-10-CM | POA: Diagnosis not present

## 2017-11-30 DIAGNOSIS — N186 End stage renal disease: Secondary | ICD-10-CM | POA: Diagnosis not present

## 2017-12-02 DIAGNOSIS — N186 End stage renal disease: Secondary | ICD-10-CM | POA: Diagnosis not present

## 2017-12-02 DIAGNOSIS — N2581 Secondary hyperparathyroidism of renal origin: Secondary | ICD-10-CM | POA: Diagnosis not present

## 2017-12-04 DIAGNOSIS — N2581 Secondary hyperparathyroidism of renal origin: Secondary | ICD-10-CM | POA: Diagnosis not present

## 2017-12-04 DIAGNOSIS — N186 End stage renal disease: Secondary | ICD-10-CM | POA: Diagnosis not present

## 2017-12-07 DIAGNOSIS — N2581 Secondary hyperparathyroidism of renal origin: Secondary | ICD-10-CM | POA: Diagnosis not present

## 2017-12-07 DIAGNOSIS — N186 End stage renal disease: Secondary | ICD-10-CM | POA: Diagnosis not present

## 2017-12-09 DIAGNOSIS — N2581 Secondary hyperparathyroidism of renal origin: Secondary | ICD-10-CM | POA: Diagnosis not present

## 2017-12-09 DIAGNOSIS — N186 End stage renal disease: Secondary | ICD-10-CM | POA: Diagnosis not present

## 2017-12-11 DIAGNOSIS — N186 End stage renal disease: Secondary | ICD-10-CM | POA: Diagnosis not present

## 2017-12-11 DIAGNOSIS — N2581 Secondary hyperparathyroidism of renal origin: Secondary | ICD-10-CM | POA: Diagnosis not present

## 2017-12-14 DIAGNOSIS — N2581 Secondary hyperparathyroidism of renal origin: Secondary | ICD-10-CM | POA: Diagnosis not present

## 2017-12-14 DIAGNOSIS — N186 End stage renal disease: Secondary | ICD-10-CM | POA: Diagnosis not present

## 2017-12-16 DIAGNOSIS — N186 End stage renal disease: Secondary | ICD-10-CM | POA: Diagnosis not present

## 2017-12-16 DIAGNOSIS — N2581 Secondary hyperparathyroidism of renal origin: Secondary | ICD-10-CM | POA: Diagnosis not present

## 2017-12-18 DIAGNOSIS — N186 End stage renal disease: Secondary | ICD-10-CM | POA: Diagnosis not present

## 2017-12-18 DIAGNOSIS — N2581 Secondary hyperparathyroidism of renal origin: Secondary | ICD-10-CM | POA: Diagnosis not present

## 2017-12-21 DIAGNOSIS — N2581 Secondary hyperparathyroidism of renal origin: Secondary | ICD-10-CM | POA: Diagnosis not present

## 2017-12-21 DIAGNOSIS — N186 End stage renal disease: Secondary | ICD-10-CM | POA: Diagnosis not present

## 2017-12-23 DIAGNOSIS — N2581 Secondary hyperparathyroidism of renal origin: Secondary | ICD-10-CM | POA: Diagnosis not present

## 2017-12-23 DIAGNOSIS — N186 End stage renal disease: Secondary | ICD-10-CM | POA: Diagnosis not present

## 2017-12-25 DIAGNOSIS — N186 End stage renal disease: Secondary | ICD-10-CM | POA: Diagnosis not present

## 2017-12-25 DIAGNOSIS — N2581 Secondary hyperparathyroidism of renal origin: Secondary | ICD-10-CM | POA: Diagnosis not present

## 2017-12-26 DIAGNOSIS — I129 Hypertensive chronic kidney disease with stage 1 through stage 4 chronic kidney disease, or unspecified chronic kidney disease: Secondary | ICD-10-CM | POA: Diagnosis not present

## 2017-12-26 DIAGNOSIS — Z992 Dependence on renal dialysis: Secondary | ICD-10-CM | POA: Diagnosis not present

## 2017-12-26 DIAGNOSIS — N186 End stage renal disease: Secondary | ICD-10-CM | POA: Diagnosis not present

## 2017-12-28 DIAGNOSIS — N2581 Secondary hyperparathyroidism of renal origin: Secondary | ICD-10-CM | POA: Diagnosis not present

## 2017-12-28 DIAGNOSIS — N186 End stage renal disease: Secondary | ICD-10-CM | POA: Diagnosis not present

## 2017-12-30 DIAGNOSIS — N2581 Secondary hyperparathyroidism of renal origin: Secondary | ICD-10-CM | POA: Diagnosis not present

## 2017-12-30 DIAGNOSIS — N186 End stage renal disease: Secondary | ICD-10-CM | POA: Diagnosis not present

## 2018-01-01 DIAGNOSIS — N2581 Secondary hyperparathyroidism of renal origin: Secondary | ICD-10-CM | POA: Diagnosis not present

## 2018-01-01 DIAGNOSIS — N186 End stage renal disease: Secondary | ICD-10-CM | POA: Diagnosis not present

## 2018-01-04 DIAGNOSIS — N186 End stage renal disease: Secondary | ICD-10-CM | POA: Diagnosis not present

## 2018-01-04 DIAGNOSIS — N2581 Secondary hyperparathyroidism of renal origin: Secondary | ICD-10-CM | POA: Diagnosis not present

## 2018-01-06 DIAGNOSIS — N2581 Secondary hyperparathyroidism of renal origin: Secondary | ICD-10-CM | POA: Diagnosis not present

## 2018-01-06 DIAGNOSIS — N186 End stage renal disease: Secondary | ICD-10-CM | POA: Diagnosis not present

## 2018-01-08 DIAGNOSIS — N186 End stage renal disease: Secondary | ICD-10-CM | POA: Diagnosis not present

## 2018-01-08 DIAGNOSIS — N2581 Secondary hyperparathyroidism of renal origin: Secondary | ICD-10-CM | POA: Diagnosis not present

## 2018-01-11 DIAGNOSIS — Z23 Encounter for immunization: Secondary | ICD-10-CM | POA: Diagnosis not present

## 2018-01-11 DIAGNOSIS — N2581 Secondary hyperparathyroidism of renal origin: Secondary | ICD-10-CM | POA: Diagnosis not present

## 2018-01-11 DIAGNOSIS — N186 End stage renal disease: Secondary | ICD-10-CM | POA: Diagnosis not present

## 2018-01-13 DIAGNOSIS — N2581 Secondary hyperparathyroidism of renal origin: Secondary | ICD-10-CM | POA: Diagnosis not present

## 2018-01-13 DIAGNOSIS — Z23 Encounter for immunization: Secondary | ICD-10-CM | POA: Diagnosis not present

## 2018-01-13 DIAGNOSIS — N186 End stage renal disease: Secondary | ICD-10-CM | POA: Diagnosis not present

## 2018-01-14 DIAGNOSIS — I130 Hypertensive heart and chronic kidney disease with heart failure and stage 1 through stage 4 chronic kidney disease, or unspecified chronic kidney disease: Secondary | ICD-10-CM | POA: Diagnosis not present

## 2018-01-14 DIAGNOSIS — R7303 Prediabetes: Secondary | ICD-10-CM | POA: Diagnosis not present

## 2018-01-14 DIAGNOSIS — N185 Chronic kidney disease, stage 5: Secondary | ICD-10-CM | POA: Diagnosis not present

## 2018-01-14 DIAGNOSIS — Z1331 Encounter for screening for depression: Secondary | ICD-10-CM | POA: Diagnosis not present

## 2018-01-15 DIAGNOSIS — N186 End stage renal disease: Secondary | ICD-10-CM | POA: Diagnosis not present

## 2018-01-15 DIAGNOSIS — N2581 Secondary hyperparathyroidism of renal origin: Secondary | ICD-10-CM | POA: Diagnosis not present

## 2018-01-15 DIAGNOSIS — Z23 Encounter for immunization: Secondary | ICD-10-CM | POA: Diagnosis not present

## 2018-01-18 ENCOUNTER — Encounter: Payer: Self-pay | Admitting: Internal Medicine

## 2018-01-18 DIAGNOSIS — N2581 Secondary hyperparathyroidism of renal origin: Secondary | ICD-10-CM | POA: Diagnosis not present

## 2018-01-18 DIAGNOSIS — N186 End stage renal disease: Secondary | ICD-10-CM | POA: Diagnosis not present

## 2018-01-20 DIAGNOSIS — N2581 Secondary hyperparathyroidism of renal origin: Secondary | ICD-10-CM | POA: Diagnosis not present

## 2018-01-20 DIAGNOSIS — N186 End stage renal disease: Secondary | ICD-10-CM | POA: Diagnosis not present

## 2018-01-21 DIAGNOSIS — I871 Compression of vein: Secondary | ICD-10-CM | POA: Diagnosis not present

## 2018-01-21 DIAGNOSIS — T82858A Stenosis of vascular prosthetic devices, implants and grafts, initial encounter: Secondary | ICD-10-CM | POA: Diagnosis not present

## 2018-01-21 DIAGNOSIS — Z992 Dependence on renal dialysis: Secondary | ICD-10-CM | POA: Diagnosis not present

## 2018-01-21 DIAGNOSIS — N186 End stage renal disease: Secondary | ICD-10-CM | POA: Diagnosis not present

## 2018-01-22 DIAGNOSIS — N186 End stage renal disease: Secondary | ICD-10-CM | POA: Diagnosis not present

## 2018-01-22 DIAGNOSIS — N2581 Secondary hyperparathyroidism of renal origin: Secondary | ICD-10-CM | POA: Diagnosis not present

## 2018-01-25 DIAGNOSIS — N2581 Secondary hyperparathyroidism of renal origin: Secondary | ICD-10-CM | POA: Diagnosis not present

## 2018-01-25 DIAGNOSIS — N186 End stage renal disease: Secondary | ICD-10-CM | POA: Diagnosis not present

## 2018-01-25 DIAGNOSIS — I129 Hypertensive chronic kidney disease with stage 1 through stage 4 chronic kidney disease, or unspecified chronic kidney disease: Secondary | ICD-10-CM | POA: Diagnosis not present

## 2018-01-25 DIAGNOSIS — Z992 Dependence on renal dialysis: Secondary | ICD-10-CM | POA: Diagnosis not present

## 2018-01-27 DIAGNOSIS — N186 End stage renal disease: Secondary | ICD-10-CM | POA: Diagnosis not present

## 2018-01-27 DIAGNOSIS — N2581 Secondary hyperparathyroidism of renal origin: Secondary | ICD-10-CM | POA: Diagnosis not present

## 2018-01-29 DIAGNOSIS — N2581 Secondary hyperparathyroidism of renal origin: Secondary | ICD-10-CM | POA: Diagnosis not present

## 2018-01-29 DIAGNOSIS — N186 End stage renal disease: Secondary | ICD-10-CM | POA: Diagnosis not present

## 2018-02-01 DIAGNOSIS — N2581 Secondary hyperparathyroidism of renal origin: Secondary | ICD-10-CM | POA: Diagnosis not present

## 2018-02-01 DIAGNOSIS — N186 End stage renal disease: Secondary | ICD-10-CM | POA: Diagnosis not present

## 2018-02-03 DIAGNOSIS — N2581 Secondary hyperparathyroidism of renal origin: Secondary | ICD-10-CM | POA: Diagnosis not present

## 2018-02-03 DIAGNOSIS — N186 End stage renal disease: Secondary | ICD-10-CM | POA: Diagnosis not present

## 2018-02-05 DIAGNOSIS — N2581 Secondary hyperparathyroidism of renal origin: Secondary | ICD-10-CM | POA: Diagnosis not present

## 2018-02-05 DIAGNOSIS — N186 End stage renal disease: Secondary | ICD-10-CM | POA: Diagnosis not present

## 2018-02-08 DIAGNOSIS — N186 End stage renal disease: Secondary | ICD-10-CM | POA: Diagnosis not present

## 2018-02-08 DIAGNOSIS — N2581 Secondary hyperparathyroidism of renal origin: Secondary | ICD-10-CM | POA: Diagnosis not present

## 2018-02-08 DIAGNOSIS — Z1231 Encounter for screening mammogram for malignant neoplasm of breast: Secondary | ICD-10-CM | POA: Diagnosis not present

## 2018-02-10 DIAGNOSIS — N186 End stage renal disease: Secondary | ICD-10-CM | POA: Diagnosis not present

## 2018-02-10 DIAGNOSIS — N2581 Secondary hyperparathyroidism of renal origin: Secondary | ICD-10-CM | POA: Diagnosis not present

## 2018-02-11 DIAGNOSIS — H8113 Benign paroxysmal vertigo, bilateral: Secondary | ICD-10-CM | POA: Diagnosis not present

## 2018-02-11 DIAGNOSIS — J309 Allergic rhinitis, unspecified: Secondary | ICD-10-CM | POA: Diagnosis not present

## 2018-02-11 DIAGNOSIS — Z79899 Other long term (current) drug therapy: Secondary | ICD-10-CM | POA: Diagnosis not present

## 2018-02-11 DIAGNOSIS — R635 Abnormal weight gain: Secondary | ICD-10-CM | POA: Diagnosis not present

## 2018-02-12 DIAGNOSIS — N186 End stage renal disease: Secondary | ICD-10-CM | POA: Diagnosis not present

## 2018-02-12 DIAGNOSIS — N2581 Secondary hyperparathyroidism of renal origin: Secondary | ICD-10-CM | POA: Diagnosis not present

## 2018-02-15 DIAGNOSIS — N2581 Secondary hyperparathyroidism of renal origin: Secondary | ICD-10-CM | POA: Diagnosis not present

## 2018-02-15 DIAGNOSIS — N186 End stage renal disease: Secondary | ICD-10-CM | POA: Diagnosis not present

## 2018-02-17 DIAGNOSIS — N186 End stage renal disease: Secondary | ICD-10-CM | POA: Diagnosis not present

## 2018-02-17 DIAGNOSIS — N2581 Secondary hyperparathyroidism of renal origin: Secondary | ICD-10-CM | POA: Diagnosis not present

## 2018-02-19 DIAGNOSIS — N2581 Secondary hyperparathyroidism of renal origin: Secondary | ICD-10-CM | POA: Diagnosis not present

## 2018-02-19 DIAGNOSIS — N186 End stage renal disease: Secondary | ICD-10-CM | POA: Diagnosis not present

## 2018-02-22 DIAGNOSIS — N186 End stage renal disease: Secondary | ICD-10-CM | POA: Diagnosis not present

## 2018-02-22 DIAGNOSIS — N2581 Secondary hyperparathyroidism of renal origin: Secondary | ICD-10-CM | POA: Diagnosis not present

## 2018-02-24 DIAGNOSIS — N2581 Secondary hyperparathyroidism of renal origin: Secondary | ICD-10-CM | POA: Diagnosis not present

## 2018-02-24 DIAGNOSIS — N186 End stage renal disease: Secondary | ICD-10-CM | POA: Diagnosis not present

## 2018-02-25 ENCOUNTER — Ambulatory Visit: Payer: BLUE CROSS/BLUE SHIELD | Admitting: Internal Medicine

## 2018-02-25 ENCOUNTER — Encounter: Payer: Self-pay | Admitting: Internal Medicine

## 2018-02-25 VITALS — BP 200/100 | HR 68 | Ht 63.0 in | Wt 177.6 lb

## 2018-02-25 DIAGNOSIS — N186 End stage renal disease: Secondary | ICD-10-CM | POA: Diagnosis not present

## 2018-02-25 DIAGNOSIS — M65342 Trigger finger, left ring finger: Secondary | ICD-10-CM | POA: Diagnosis not present

## 2018-02-25 DIAGNOSIS — K439 Ventral hernia without obstruction or gangrene: Secondary | ICD-10-CM

## 2018-02-25 DIAGNOSIS — K219 Gastro-esophageal reflux disease without esophagitis: Secondary | ICD-10-CM | POA: Diagnosis not present

## 2018-02-25 DIAGNOSIS — Z86010 Personal history of colonic polyps: Secondary | ICD-10-CM

## 2018-02-25 DIAGNOSIS — Z992 Dependence on renal dialysis: Secondary | ICD-10-CM | POA: Diagnosis not present

## 2018-02-25 DIAGNOSIS — R1310 Dysphagia, unspecified: Secondary | ICD-10-CM

## 2018-02-25 DIAGNOSIS — M65341 Trigger finger, right ring finger: Secondary | ICD-10-CM | POA: Diagnosis not present

## 2018-02-25 DIAGNOSIS — I129 Hypertensive chronic kidney disease with stage 1 through stage 4 chronic kidney disease, or unspecified chronic kidney disease: Secondary | ICD-10-CM | POA: Diagnosis not present

## 2018-02-25 MED ORDER — NA SULFATE-K SULFATE-MG SULF 17.5-3.13-1.6 GM/177ML PO SOLN: 1.0000 | Freq: Once | ORAL | 0 refills | Status: AC

## 2018-02-25 NOTE — Patient Instructions (Signed)

## 2018-02-25 NOTE — Progress Notes (Signed)
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  The patient is new to this office.  She is accompanied by her husband.  She has had extensive GI care elsewhere but there are no records.  She is sent by her primary care provider Dr. Lorin Mercy.  Previous GI care in her hometown.  According to the patient and her husband she underwent complete colonoscopy December 2016.  She tells me that she had one polyp removed but was told to follow-up in 1 year (suggesting advanced lesion).  She tells me that her preparation was good.  She did not follow-up due to other medical issues.  She also tells me that she underwent upper endoscopy that time for chronic reflux symptoms and dysphasia.  She tells me that her esophagus was dilated and this helps.  She had been on PPI but was taken off PPI by her kidney doctors and placed on Zantac.  This is not as effective.  She has had recurrent intermittent solid food dysphasia to items such as hamburgers.  She also reports to me complicated gallbladder surgery around May 2017 requiring open cholecystectomy with subsequent development of incisional hernia.  She denies issues with her bowel habits.  She takes Norvasc for hypertension.  When asked about her blood pressure today she tells me that is typically high.  REVIEW OF SYSTEMS:  All non-GI ROS negative unless otherwise stated in the HPI except for back pain, sinus and allergy trouble, visual change  Past Medical History:  Diagnosis Date  . Anemia   . Anxiety   . Complication of anesthesia    slow to awaken, sentive to medications rarely even takes a Tylenol.  . Diarrhea   . End-stage kidney disease ([place])    M/W/F- [place]  . Gastric ulcer with hemorrhage   . GERD (gastroesophageal reflux disease)   . Hyperlipidemia   . Hypertension   . Pneumonia    . PONV (postoperative nausea and vomiting)    after hysterectomy- 1980's  . Pre-diabetes   . Umbilical hernia     Past Surgical History:  Procedure Laterality Date  . ABDOMINAL HYSTERECTOMY    . APPENDECTOMY    . BASCILIC VEIN TRANSPOSITION Left 07/15/2016   Procedure: BRACHIAL VEIN TRANSPOSITION FIRST STAGE;  Surgeon: Conrad Wiley, MD;  Location: [facility];  Service: Vascular;  Laterality: Left;  . BASCILIC VEIN TRANSPOSITION Left 11/04/2016   Procedure: SECOND STAGE BRACHIAL VEIN TRANSPOSITION;  Surgeon: Conrad , MD;  Location: [facility];  Service: Vascular;  Laterality: Left;  . CHOLECYSTECTOMY OPEN    . COLONOSCOPY W/ POLYPECTOMY    . EYE SURGERY Right    catarct  . EYE SURGERY Left 08/2016   cataract  . INSERTION OF DIALYSIS CATHETER    . IR GENERIC HISTORICAL  06/23/2016   IR US GUIDE VASC ACCESS RIGHT 06/23/2016 Arne Cleveland, MD [facility]  . IR GENERIC HISTORICAL  06/23/2016   IR FLUORO GUIDE CV LINE RIGHT 06/23/2016 Arne Cleveland, MD [facility]  . TOTAL ABDOMINAL HYSTERECTOMY W/ BILATERAL SALPINGOOPHORECTOMY    . TRABECULECTOMY Bilateral     Social History Keayra Graham  reports that she quit smoking about 22 years ago. Her smoking use included cigarettes. She has never used smokeless tobacco. She reports that she does not drink alcohol or use drugs.  family history includes Hypertension in her mother.  Allergies  Allergen Reactions  . Nsaids Other (See Comments)    Has a healing stomach ulcer  . Penicillins Rash and Other (See Comments)    Rash and rib pain.  PATIENT HAD A PCN REACTION WITH IMMEDIATE RASH, FACIAL/TONGUE/THROAT SWELLING, SOB, OR LIGHTHEADEDNESS WITH HYPOTENSION:  #  #  #  YES  #  #  #   Has patient had a PCN reaction causing severe rash involving mucus membranes or skin necrosis: No Has patient had a PCN reaction that required hospitalization: No Has patient had a PCN reaction occurring within the last 10 years: No.   . Strawberry Extract  Swelling    SWELLING REACTION UNSPECIFIED   . Clonidine Nausea And Vomiting  . Sulfa Antibiotics Rash and Other (See Comments)    Rib pain       PHYSICAL EXAMINATION: Vital signs: BP (!) 200/100   Pulse 68   Ht 5\' 3"  (1.6 m)   Wt 177 lb 9.6 oz (80.6 kg)   BMI 31.46 kg/m   Constitutional: Pleasant, chronically ill-appearing and older appearing than stated age, no acute distress Psychiatric: alert and oriented x3, cooperative Eyes: extraocular movements intact, anicteric, conjunctiva pink Mouth: oral pharynx moist, no lesions Neck: supple no lymphadenopathy Cardiovascular: heart regular rate and rhythm, no murmur Lungs: clear to auscultation bilaterally Abdomen: soft, obese, nontender, nondistended, no obvious ascites, no peritoneal signs, normal bowel sounds, no organomegaly.  Surgical incisions well-healed though obvious ventral hernia that is spontaneously reduced Rectal: Deferred until colonoscopy Extremities: no clubbing, cyanosis, or lower extremity edema bilaterally.  AV fistula left arm Skin: no lesions on visible extremities Neuro: No focal deficits.  Cranial nerves intact  ASSESSMENT:  1.  Chronic GERD.  Significant symptoms on H2 receptor antagonist therapy 2.  Recurrent intermittent solid food dysphasia likely secondary to peptic stricture 3.  Complicated gallbladder surgery with subsequent ventral hernia 4.  Apparently advanced colonic lesion for which she is overdue for follow-up.  Sent here by her PCP to have this performed 5.  Multiple medical problems including dialysis 6.  Hypertension  PLAN:  1.  Reflux precautions 2.  Stop ranitidine 3.  Prescribed omeprazole 40 mg daily 4.  Schedule upper endoscopy with probable esophageal dilation.  The patient is HIGH RISK given her comorbidities and dialysis state.The nature of the procedure, as well as the risks, benefits, and alternatives were carefully and thoroughly reviewed with the patient. Ample time for  discussion and questions allowed. The patient understood, was satisfied, and agreed to proceed. 5.  Schedule surveillance colonoscopy.  The patient is high risk as above.The nature of the procedure, as well as the risks, benefits, and alternatives were carefully and thoroughly reviewed with the patient. Ample time for discussion and questions allowed. The patient understood, was satisfied, and agreed to proceed.  The procedures should be performed on a nondialysis day 6.  Discussion on ventral hernia.  Recommend observation at this point.  Discussed signs or symptoms that would require surgical assessment or intervention 7.  Obtain outside records for review.  Requested 8.  Chronic hypertension.  Needs to see her PCP again to get this under better control

## 2018-02-26 DIAGNOSIS — N186 End stage renal disease: Secondary | ICD-10-CM | POA: Diagnosis not present

## 2018-02-26 DIAGNOSIS — N2581 Secondary hyperparathyroidism of renal origin: Secondary | ICD-10-CM | POA: Diagnosis not present

## 2018-03-01 DIAGNOSIS — N186 End stage renal disease: Secondary | ICD-10-CM | POA: Diagnosis not present

## 2018-03-01 DIAGNOSIS — N2581 Secondary hyperparathyroidism of renal origin: Secondary | ICD-10-CM | POA: Diagnosis not present

## 2018-03-03 DIAGNOSIS — N186 End stage renal disease: Secondary | ICD-10-CM | POA: Diagnosis not present

## 2018-03-03 DIAGNOSIS — N2581 Secondary hyperparathyroidism of renal origin: Secondary | ICD-10-CM | POA: Diagnosis not present

## 2018-03-05 DIAGNOSIS — N186 End stage renal disease: Secondary | ICD-10-CM | POA: Diagnosis not present

## 2018-03-05 DIAGNOSIS — N2581 Secondary hyperparathyroidism of renal origin: Secondary | ICD-10-CM | POA: Diagnosis not present

## 2018-03-08 DIAGNOSIS — N2581 Secondary hyperparathyroidism of renal origin: Secondary | ICD-10-CM | POA: Diagnosis not present

## 2018-03-08 DIAGNOSIS — N186 End stage renal disease: Secondary | ICD-10-CM | POA: Diagnosis not present

## 2018-03-10 DIAGNOSIS — N186 End stage renal disease: Secondary | ICD-10-CM | POA: Diagnosis not present

## 2018-03-10 DIAGNOSIS — N2581 Secondary hyperparathyroidism of renal origin: Secondary | ICD-10-CM | POA: Diagnosis not present

## 2018-03-12 DIAGNOSIS — N186 End stage renal disease: Secondary | ICD-10-CM | POA: Diagnosis not present

## 2018-03-12 DIAGNOSIS — N2581 Secondary hyperparathyroidism of renal origin: Secondary | ICD-10-CM | POA: Diagnosis not present

## 2018-03-15 DIAGNOSIS — N2581 Secondary hyperparathyroidism of renal origin: Secondary | ICD-10-CM | POA: Diagnosis not present

## 2018-03-15 DIAGNOSIS — N186 End stage renal disease: Secondary | ICD-10-CM | POA: Diagnosis not present

## 2018-03-17 DIAGNOSIS — N186 End stage renal disease: Secondary | ICD-10-CM | POA: Diagnosis not present

## 2018-03-17 DIAGNOSIS — N2581 Secondary hyperparathyroidism of renal origin: Secondary | ICD-10-CM | POA: Diagnosis not present

## 2018-03-19 DIAGNOSIS — N186 End stage renal disease: Secondary | ICD-10-CM | POA: Diagnosis not present

## 2018-03-19 DIAGNOSIS — N2581 Secondary hyperparathyroidism of renal origin: Secondary | ICD-10-CM | POA: Diagnosis not present

## 2018-03-23 DIAGNOSIS — N2581 Secondary hyperparathyroidism of renal origin: Secondary | ICD-10-CM | POA: Diagnosis not present

## 2018-03-23 DIAGNOSIS — N186 End stage renal disease: Secondary | ICD-10-CM | POA: Diagnosis not present

## 2018-03-24 DIAGNOSIS — N2581 Secondary hyperparathyroidism of renal origin: Secondary | ICD-10-CM | POA: Diagnosis not present

## 2018-03-24 DIAGNOSIS — N186 End stage renal disease: Secondary | ICD-10-CM | POA: Diagnosis not present

## 2018-03-26 DIAGNOSIS — N2581 Secondary hyperparathyroidism of renal origin: Secondary | ICD-10-CM | POA: Diagnosis not present

## 2018-03-26 DIAGNOSIS — N186 End stage renal disease: Secondary | ICD-10-CM | POA: Diagnosis not present

## 2018-03-27 DIAGNOSIS — Z992 Dependence on renal dialysis: Secondary | ICD-10-CM | POA: Diagnosis not present

## 2018-03-27 DIAGNOSIS — N186 End stage renal disease: Secondary | ICD-10-CM | POA: Diagnosis not present

## 2018-03-27 DIAGNOSIS — I129 Hypertensive chronic kidney disease with stage 1 through stage 4 chronic kidney disease, or unspecified chronic kidney disease: Secondary | ICD-10-CM | POA: Diagnosis not present

## 2018-03-29 DIAGNOSIS — N186 End stage renal disease: Secondary | ICD-10-CM | POA: Diagnosis not present

## 2018-03-29 DIAGNOSIS — N2581 Secondary hyperparathyroidism of renal origin: Secondary | ICD-10-CM | POA: Diagnosis not present

## 2018-03-31 DIAGNOSIS — N2581 Secondary hyperparathyroidism of renal origin: Secondary | ICD-10-CM | POA: Diagnosis not present

## 2018-03-31 DIAGNOSIS — N186 End stage renal disease: Secondary | ICD-10-CM | POA: Diagnosis not present

## 2018-04-02 DIAGNOSIS — N186 End stage renal disease: Secondary | ICD-10-CM | POA: Diagnosis not present

## 2018-04-02 DIAGNOSIS — N2581 Secondary hyperparathyroidism of renal origin: Secondary | ICD-10-CM | POA: Diagnosis not present

## 2018-04-05 DIAGNOSIS — N186 End stage renal disease: Secondary | ICD-10-CM | POA: Diagnosis not present

## 2018-04-05 DIAGNOSIS — N2581 Secondary hyperparathyroidism of renal origin: Secondary | ICD-10-CM | POA: Diagnosis not present

## 2018-04-07 DIAGNOSIS — N2581 Secondary hyperparathyroidism of renal origin: Secondary | ICD-10-CM | POA: Diagnosis not present

## 2018-04-07 DIAGNOSIS — N186 End stage renal disease: Secondary | ICD-10-CM | POA: Diagnosis not present

## 2018-04-08 ENCOUNTER — Encounter: Payer: BLUE CROSS/BLUE SHIELD | Admitting: Internal Medicine

## 2018-04-10 DIAGNOSIS — N2581 Secondary hyperparathyroidism of renal origin: Secondary | ICD-10-CM | POA: Diagnosis not present

## 2018-04-10 DIAGNOSIS — N186 End stage renal disease: Secondary | ICD-10-CM | POA: Diagnosis not present

## 2018-04-12 DIAGNOSIS — N2581 Secondary hyperparathyroidism of renal origin: Secondary | ICD-10-CM | POA: Diagnosis not present

## 2018-04-12 DIAGNOSIS — N186 End stage renal disease: Secondary | ICD-10-CM | POA: Diagnosis not present

## 2018-04-14 DIAGNOSIS — N2581 Secondary hyperparathyroidism of renal origin: Secondary | ICD-10-CM | POA: Diagnosis not present

## 2018-04-14 DIAGNOSIS — N186 End stage renal disease: Secondary | ICD-10-CM | POA: Diagnosis not present

## 2018-04-16 DIAGNOSIS — N2581 Secondary hyperparathyroidism of renal origin: Secondary | ICD-10-CM | POA: Diagnosis not present

## 2018-04-16 DIAGNOSIS — N186 End stage renal disease: Secondary | ICD-10-CM | POA: Diagnosis not present

## 2018-04-18 DIAGNOSIS — N186 End stage renal disease: Secondary | ICD-10-CM | POA: Diagnosis not present

## 2018-04-18 DIAGNOSIS — N2581 Secondary hyperparathyroidism of renal origin: Secondary | ICD-10-CM | POA: Diagnosis not present

## 2018-04-20 DIAGNOSIS — N186 End stage renal disease: Secondary | ICD-10-CM | POA: Diagnosis not present

## 2018-04-20 DIAGNOSIS — N2581 Secondary hyperparathyroidism of renal origin: Secondary | ICD-10-CM | POA: Diagnosis not present

## 2018-04-23 ENCOUNTER — Telehealth: Payer: Self-pay | Admitting: Internal Medicine

## 2018-04-23 DIAGNOSIS — N2581 Secondary hyperparathyroidism of renal origin: Secondary | ICD-10-CM | POA: Diagnosis not present

## 2018-04-23 DIAGNOSIS — N186 End stage renal disease: Secondary | ICD-10-CM | POA: Diagnosis not present

## 2018-04-23 NOTE — Telephone Encounter (Signed)
Pt called in and wanted to speak with the nurse because her blood was checked by provider and it dropped. 10.3 to 8 and she is concerned

## 2018-04-23 NOTE — Telephone Encounter (Signed)
Pt states she is concerned because her Hgb has dropped and she would like to move her ECL up sooner. Pts ECL rescheduled to 05/07/18@2 :30pm. Pt is on dialysis and she was told by dialysis she should call and get her appt moved up sooner.

## 2018-04-25 DIAGNOSIS — N186 End stage renal disease: Secondary | ICD-10-CM | POA: Diagnosis not present

## 2018-04-25 DIAGNOSIS — N2581 Secondary hyperparathyroidism of renal origin: Secondary | ICD-10-CM | POA: Diagnosis not present

## 2018-04-27 DIAGNOSIS — N186 End stage renal disease: Secondary | ICD-10-CM | POA: Diagnosis not present

## 2018-04-27 DIAGNOSIS — Z992 Dependence on renal dialysis: Secondary | ICD-10-CM | POA: Diagnosis not present

## 2018-04-27 DIAGNOSIS — N2581 Secondary hyperparathyroidism of renal origin: Secondary | ICD-10-CM | POA: Diagnosis not present

## 2018-04-27 DIAGNOSIS — I129 Hypertensive chronic kidney disease with stage 1 through stage 4 chronic kidney disease, or unspecified chronic kidney disease: Secondary | ICD-10-CM | POA: Diagnosis not present

## 2018-04-29 ENCOUNTER — Encounter: Payer: Self-pay | Admitting: Internal Medicine

## 2018-04-30 DIAGNOSIS — I871 Compression of vein: Secondary | ICD-10-CM | POA: Diagnosis not present

## 2018-04-30 DIAGNOSIS — T82858A Stenosis of vascular prosthetic devices, implants and grafts, initial encounter: Secondary | ICD-10-CM | POA: Diagnosis not present

## 2018-04-30 DIAGNOSIS — Z992 Dependence on renal dialysis: Secondary | ICD-10-CM | POA: Diagnosis not present

## 2018-04-30 DIAGNOSIS — N186 End stage renal disease: Secondary | ICD-10-CM | POA: Diagnosis not present

## 2018-05-01 DIAGNOSIS — N186 End stage renal disease: Secondary | ICD-10-CM | POA: Diagnosis not present

## 2018-05-01 DIAGNOSIS — N2581 Secondary hyperparathyroidism of renal origin: Secondary | ICD-10-CM | POA: Diagnosis not present

## 2018-05-03 ENCOUNTER — Telehealth: Payer: Self-pay | Admitting: Internal Medicine

## 2018-05-03 DIAGNOSIS — N186 End stage renal disease: Secondary | ICD-10-CM | POA: Diagnosis not present

## 2018-05-03 DIAGNOSIS — N2581 Secondary hyperparathyroidism of renal origin: Secondary | ICD-10-CM | POA: Diagnosis not present

## 2018-05-03 NOTE — Telephone Encounter (Signed)
Pt has a procedure Friday 05-07-2018.  She is on dialysis - her BP dropped after dialysis today and she has been told to eat pickles-  She ate a small pickle today - Instructed no raw veggies- she can do pickle juice-  She asked about pickle potato chips  That's ok until Thursday ---  We discussed thursdays diet , clears only -  Call with questions  Lelan Pons PV

## 2018-05-03 NOTE — Telephone Encounter (Signed)
Pt called and stated that her bp had drop and she ate a dill pickle by mistake and want to know if that will be ok for her to keep her sched proc.

## 2018-05-05 DIAGNOSIS — N2581 Secondary hyperparathyroidism of renal origin: Secondary | ICD-10-CM | POA: Diagnosis not present

## 2018-05-05 DIAGNOSIS — N186 End stage renal disease: Secondary | ICD-10-CM | POA: Diagnosis not present

## 2018-05-06 ENCOUNTER — Telehealth: Payer: Self-pay | Admitting: Internal Medicine

## 2018-05-06 ENCOUNTER — Telehealth: Payer: Self-pay | Admitting: *Deleted

## 2018-05-06 NOTE — Telephone Encounter (Signed)
Called patient back again regarding her medication questions. She had a question regarding taking her Norvasc and Tums tonight if needed. Pt told she was fine to take these. Pt verbalized understanding. SM

## 2018-05-06 NOTE — Telephone Encounter (Signed)
Left message for patient to call back with medication questions.

## 2018-05-06 NOTE — Telephone Encounter (Signed)
No answer. Left message to call us back.   Riki Sheer, LPN (admitting )

## 2018-05-06 NOTE — Telephone Encounter (Signed)
Pt returning call had questions before proc

## 2018-05-07 ENCOUNTER — Encounter: Payer: Self-pay | Admitting: Internal Medicine

## 2018-05-07 ENCOUNTER — Ambulatory Visit (AMBULATORY_SURGERY_CENTER): Payer: BLUE CROSS/BLUE SHIELD | Admitting: Internal Medicine

## 2018-05-07 VITALS — BP 189/96 | HR 69 | Temp 99.1°F | Resp 15

## 2018-05-07 DIAGNOSIS — D120 Benign neoplasm of cecum: Secondary | ICD-10-CM | POA: Diagnosis not present

## 2018-05-07 DIAGNOSIS — K222 Esophageal obstruction: Secondary | ICD-10-CM | POA: Diagnosis not present

## 2018-05-07 DIAGNOSIS — R1310 Dysphagia, unspecified: Secondary | ICD-10-CM | POA: Diagnosis not present

## 2018-05-07 DIAGNOSIS — K2100 Gastro-esophageal reflux disease with esophagitis, without bleeding: Secondary | ICD-10-CM

## 2018-05-07 DIAGNOSIS — Z86010 Personal history of colonic polyps: Secondary | ICD-10-CM | POA: Diagnosis not present

## 2018-05-07 DIAGNOSIS — K635 Polyp of colon: Secondary | ICD-10-CM

## 2018-05-07 MED ORDER — OMEPRAZOLE 40 MG PO CPDR: 40.0000 mg | DELAYED_RELEASE_CAPSULE | Freq: Every day | ORAL | 11 refills | Status: DC

## 2018-05-07 MED ORDER — SODIUM CHLORIDE 0.9 % IV SOLN: 500.0000 mL | Freq: Once | INTRAVENOUS | Status: DC

## 2018-05-07 NOTE — Op Note (Addendum)
[place] Patient Name: Alison Baker Procedure Date: 05/07/2018 2:24 PM MRN: [id number] Endoscopist: Docia Chuck. Henrene Pastor , MD Age: 63 Referring MD:  Date of Birth: 1955-11-20 Gender: Female Account #: [id number] Procedure:                Upper GI endoscopy with [facility] dilation of the                            esophagus. [address] [place] Indications:              Dysphagia, Esophageal reflux Medicines:                Monitored Anesthesia Care Procedure:                Pre-Anesthesia Assessment:                           - Prior to the procedure, a History and Physical                            was performed, and patient medications and                            allergies were reviewed. The patient's tolerance of                            previous anesthesia was also reviewed. The risks                            and benefits of the procedure and the sedation                            options and risks were discussed with the patient.                            All questions were answered, and informed consent                            was obtained. Prior Anticoagulants: The patient has                            taken no previous anticoagulant or antiplatelet                            agents. ASA Grade Assessment: III - A patient with                            severe systemic disease. After reviewing the risks                            and benefits, the patient was deemed in                            satisfactory condition to undergo the procedure.  After obtaining informed consent, the endoscope was                            passed under direct vision. Throughout the                            procedure, the patient's blood pressure, pulse, and                            oxygen saturations were monitored continuously. The                            Model GIF-HQ190 [id number]) scope was introduced                            through the mouth, and  advanced to the second part                            of duodenum. The upper GI endoscopy was                            accomplished without difficulty. The patient                            tolerated the procedure well. Scope In: Scope Out: Findings:                 One benign-appearing, intrinsic moderate stenosis                            was found 38 cm from the incisors. This stenosis                            measured 1.5 cm (inner diameter). There was also                            associated esophagitis as manifested by erythema,                            mild friability, and edema. The scope was                            withdrawn. Dilation was performed with a Maloney                            dilator with no resistance at 40 Fr.                           The exam of the esophagus was otherwise normal.                           The stomach was normal.                           The examined duodenum was  normal.                           The cardia and gastric fundus were normal on                            retroflexion. Complications:            No immediate complications. Estimated Blood Loss:     Estimated blood loss: none. Impression:               1. GERD with esophagitis and peptic stricture                            status post dilation                           2. Otherwise normal EGD. Recommendation:           1. Post dilation diet                           2. Prescribe omeprazole 40 mg daily; #30; 11 refills                           3. Office follow-up with Dr. Henrene Pastor in about 3                            months. Docia Chuck. Henrene Pastor, MD 05/07/2018 3:00:09 PM This report has been signed electronically.

## 2018-05-07 NOTE — Op Note (Signed)
[place] Patient Name: Alison Baker Procedure Date: 05/07/2018 2:24 PM MRN: [id number] Endoscopist: Docia Chuck. Henrene Pastor , MD Age: 63 Referring MD:  Date of Birth: 1956-03-06 Gender: Female Account #: [id number] Procedure:                Colonoscopy with cold snare polypectomy x 1 Indications:              High risk colon cancer surveillance: Personal                            history of colonic polyps previous colonoscopy                            elsewhere reported December 2016. Told to follow-up                            in 1 year. No details despite attempts to retrieve                            old records Medicines:                Monitored Anesthesia Care Procedure:                Pre-Anesthesia Assessment:                           - Prior to the procedure, a History and Physical                            was performed, and patient medications and                            allergies were reviewed. The patient's tolerance of                            previous anesthesia was also reviewed. The risks                            and benefits of the procedure and the sedation                            options and risks were discussed with the patient.                            All questions were answered, and informed consent                            was obtained. Prior Anticoagulants: The patient has                            taken no previous anticoagulant or antiplatelet                            agents. ASA Grade Assessment: III - A patient with  severe systemic disease. After reviewing the risks                            and benefits, the patient was deemed in                            satisfactory condition to undergo the procedure.                           After obtaining informed consent, the colonoscope                            was passed under direct vision. Throughout the                            procedure, the  patient's blood pressure, pulse, and                            oxygen saturations were monitored continuously. The                            Model CF-HQ190L [id number]) scope was introduced                            through the anus and advanced to the the cecum,                            identified by appendiceal orifice and ileocecal                            valve. The ileocecal valve, appendiceal orifice,                            and rectum were photographed. The quality of the                            bowel preparation was good. The colonoscopy was                            performed without difficulty. The patient tolerated                            the procedure well. The bowel preparation used was                            SUPREP. Scope In: 2:29:56 PM Scope Out: 2:45:39 PM Scope Withdrawal Time: 0 hours 10 minutes 40 seconds  Total Procedure Duration: 0 hours 15 minutes 43 seconds  Findings:                 A 5 mm polyp was found in the cecum. The polyp was                            removed with a cold snare. Resection and retrieval  were complete.                           Diverticula were found in the sigmoid colon.                           The exam was otherwise without abnormality on                            direct and retroflexion views. Complications:            No immediate complications. Estimated blood loss:                            None. Estimated Blood Loss:     Estimated blood loss: none. Impression:               - One 5 mm polyp in the cecum, removed with a cold                            snare. Resected and retrieved.                           - Diverticulosis in the sigmoid colon.                           - The examination was otherwise normal on direct                            and retroflexion views. Recommendation:           - Repeat colonoscopy in 5-10 years for surveillance.                           - Patient  has a contact number available for                            emergencies. The signs and symptoms of potential                            delayed complications were discussed with the                            patient. Return to normal activities tomorrow.                            Written discharge instructions were provided to the                            patient.                           - Resume previous diet.                           - Continue present medications.                           -  Await pathology results. Docia Chuck. Henrene Pastor, MD 05/07/2018 2:49:50 PM This report has been signed electronically.

## 2018-05-07 NOTE — Progress Notes (Signed)
Called to room to assist during endoscopic procedure.  Patient ID and intended procedure confirmed with present staff. Received instructions for my participation in the procedure from the performing physician.  

## 2018-05-07 NOTE — Progress Notes (Signed)
Report to PACU, RN, vss, BBS= Clear.  

## 2018-05-07 NOTE — Progress Notes (Signed)
Pt's states no medical or surgical changes since previsit or office visit. 

## 2018-05-07 NOTE — Patient Instructions (Signed)
Information on polyps and diverticulosis, esophagitis, GERD and dilation diet given to you today.  Await pathology results.  Post dilation diet - see handout. Will be able to begin regular diet tomorrow.  Begin omeprazole 40mg  every day before breakfast.  YOU HAD AN ENDOSCOPIC PROCEDURE TODAY AT [facility]:   Refer to the procedure report that was given to you for any specific questions about what was found during the examination.  If the procedure report does not answer your questions, please call your gastroenterologist to clarify.  If you requested that your care partner not be given the details of your procedure findings, then the procedure report has been included in a sealed envelope for you to review at your convenience later.  YOU SHOULD EXPECT: Some feelings of bloating in the abdomen. Passage of more gas than usual.  Walking can help get rid of the air that was put into your GI tract during the procedure and reduce the bloating. If you had a lower endoscopy (such as a colonoscopy or flexible sigmoidoscopy) you may notice spotting of blood in your stool or on the toilet paper. If you underwent a bowel prep for your procedure, you may not have a normal bowel movement for a few days.  Please Note:  You might notice some irritation and congestion in your nose or some drainage.  This is from the oxygen used during your procedure.  There is no need for concern and it should clear up in a day or so.  SYMPTOMS TO REPORT IMMEDIATELY:   Following lower endoscopy (colonoscopy or flexible sigmoidoscopy):  Excessive amounts of blood in the stool  Significant tenderness or worsening of abdominal pains  Swelling of the abdomen that is new, acute  Fever of 100F or higher   Following upper endoscopy (EGD)  Vomiting of blood or coffee ground material  New chest pain or pain under the shoulder blades  Painful or persistently difficult swallowing  New shortness of breath  Fever  of 100F or higher  Black, tarry-looking stools  For urgent or emergent issues, a gastroenterologist can be reached at any hour by calling [phone].   DIET:  We do recommend a small meal at first, but then you may proceed to your regular diet.  Drink plenty of fluids but you should avoid alcoholic beverages for 24 hours.  ACTIVITY:  You should plan to take it easy for the rest of today and you should NOT DRIVE or use heavy machinery until tomorrow (because of the sedation medicines used during the test).    FOLLOW UP: Our staff will call the number listed on your records the next business day following your procedure to check on you and address any questions or concerns that you may have regarding the information given to you following your procedure. If we do not reach you, we will leave a message.  However, if you are feeling well and you are not experiencing any problems, there is no need to return our call.  We will assume that you have returned to your regular daily activities without incident.  If any biopsies were taken you will be contacted by phone or by letter within the next 1-3 weeks.  Please call us at [phone] if you have not heard about the biopsies in 3 weeks.    SIGNATURES/CONFIDENTIALITY: You and/or your care partner have signed paperwork which will be entered into your electronic medical record.  These signatures attest to the fact that that  the information above on your After Visit Summary has been reviewed and is understood.  Full responsibility of the confidentiality of this discharge information lies with you and/or your care-partner.

## 2018-05-08 DIAGNOSIS — N186 End stage renal disease: Secondary | ICD-10-CM | POA: Diagnosis not present

## 2018-05-08 DIAGNOSIS — N2581 Secondary hyperparathyroidism of renal origin: Secondary | ICD-10-CM | POA: Diagnosis not present

## 2018-05-10 ENCOUNTER — Telehealth: Payer: Self-pay

## 2018-05-10 DIAGNOSIS — N186 End stage renal disease: Secondary | ICD-10-CM | POA: Diagnosis not present

## 2018-05-10 DIAGNOSIS — N2581 Secondary hyperparathyroidism of renal origin: Secondary | ICD-10-CM | POA: Diagnosis not present

## 2018-05-10 NOTE — Telephone Encounter (Signed)
  Follow up Call-  Call back number 05/07/2018  Post procedure Call Back phone  # [phone]  Permission to leave phone message Yes     Patient questions:  Do you have a fever, pain , or abdominal swelling? No. Pain Score  0 *  Have you tolerated food without any problems? Yes.    Have you been able to return to your normal activities? Yes.    Do you have any questions about your discharge instructions: Diet   No. Medications  No. Follow up visit  No.  Do you have questions or concerns about your Care? No.  Actions: * If pain score is 4 or above: No action needed, pain <4.   Per pt she had a very sore throat after the EGD with dil.  I asked if she gargled with warm salt water.  Pt said her throat did feel much better but still a little sore.  I asked her to call back if sx do not resolve in the next couple of days.  She said she would.  Pt also asked when she would have a BM.  I explained that her colon was cleaned out with the prep and she needed to get back to her regular diet.  It depended on her GI tract as to how quickly she got back to BM.  maw

## 2018-05-12 DIAGNOSIS — N186 End stage renal disease: Secondary | ICD-10-CM | POA: Diagnosis not present

## 2018-05-12 DIAGNOSIS — N2581 Secondary hyperparathyroidism of renal origin: Secondary | ICD-10-CM | POA: Diagnosis not present

## 2018-05-13 ENCOUNTER — Encounter: Payer: Self-pay | Admitting: Internal Medicine

## 2018-05-14 DIAGNOSIS — N2581 Secondary hyperparathyroidism of renal origin: Secondary | ICD-10-CM | POA: Diagnosis not present

## 2018-05-14 DIAGNOSIS — N186 End stage renal disease: Secondary | ICD-10-CM | POA: Diagnosis not present

## 2018-05-17 DIAGNOSIS — N2581 Secondary hyperparathyroidism of renal origin: Secondary | ICD-10-CM | POA: Diagnosis not present

## 2018-05-17 DIAGNOSIS — N186 End stage renal disease: Secondary | ICD-10-CM | POA: Diagnosis not present

## 2018-05-19 DIAGNOSIS — N2581 Secondary hyperparathyroidism of renal origin: Secondary | ICD-10-CM | POA: Diagnosis not present

## 2018-05-19 DIAGNOSIS — N186 End stage renal disease: Secondary | ICD-10-CM | POA: Diagnosis not present

## 2018-05-20 ENCOUNTER — Encounter: Payer: BLUE CROSS/BLUE SHIELD | Admitting: Internal Medicine

## 2018-05-21 DIAGNOSIS — N2581 Secondary hyperparathyroidism of renal origin: Secondary | ICD-10-CM | POA: Diagnosis not present

## 2018-05-21 DIAGNOSIS — N186 End stage renal disease: Secondary | ICD-10-CM | POA: Diagnosis not present

## 2018-05-24 DIAGNOSIS — N186 End stage renal disease: Secondary | ICD-10-CM | POA: Diagnosis not present

## 2018-05-24 DIAGNOSIS — N2581 Secondary hyperparathyroidism of renal origin: Secondary | ICD-10-CM | POA: Diagnosis not present

## 2018-05-26 DIAGNOSIS — N2581 Secondary hyperparathyroidism of renal origin: Secondary | ICD-10-CM | POA: Diagnosis not present

## 2018-05-26 DIAGNOSIS — N186 End stage renal disease: Secondary | ICD-10-CM | POA: Diagnosis not present

## 2018-05-28 DIAGNOSIS — N2581 Secondary hyperparathyroidism of renal origin: Secondary | ICD-10-CM | POA: Diagnosis not present

## 2018-05-28 DIAGNOSIS — Z992 Dependence on renal dialysis: Secondary | ICD-10-CM | POA: Diagnosis not present

## 2018-05-28 DIAGNOSIS — N186 End stage renal disease: Secondary | ICD-10-CM | POA: Diagnosis not present

## 2018-05-28 DIAGNOSIS — I129 Hypertensive chronic kidney disease with stage 1 through stage 4 chronic kidney disease, or unspecified chronic kidney disease: Secondary | ICD-10-CM | POA: Diagnosis not present

## 2018-05-31 DIAGNOSIS — N186 End stage renal disease: Secondary | ICD-10-CM | POA: Diagnosis not present

## 2018-05-31 DIAGNOSIS — N2581 Secondary hyperparathyroidism of renal origin: Secondary | ICD-10-CM | POA: Diagnosis not present

## 2018-06-02 DIAGNOSIS — N186 End stage renal disease: Secondary | ICD-10-CM | POA: Diagnosis not present

## 2018-06-02 DIAGNOSIS — N2581 Secondary hyperparathyroidism of renal origin: Secondary | ICD-10-CM | POA: Diagnosis not present

## 2018-06-04 DIAGNOSIS — N2581 Secondary hyperparathyroidism of renal origin: Secondary | ICD-10-CM | POA: Diagnosis not present

## 2018-06-04 DIAGNOSIS — N186 End stage renal disease: Secondary | ICD-10-CM | POA: Diagnosis not present

## 2018-06-07 DIAGNOSIS — N2581 Secondary hyperparathyroidism of renal origin: Secondary | ICD-10-CM | POA: Diagnosis not present

## 2018-06-07 DIAGNOSIS — N186 End stage renal disease: Secondary | ICD-10-CM | POA: Diagnosis not present

## 2018-06-09 DIAGNOSIS — N186 End stage renal disease: Secondary | ICD-10-CM | POA: Diagnosis not present

## 2018-06-09 DIAGNOSIS — N2581 Secondary hyperparathyroidism of renal origin: Secondary | ICD-10-CM | POA: Diagnosis not present

## 2018-06-11 DIAGNOSIS — N186 End stage renal disease: Secondary | ICD-10-CM | POA: Diagnosis not present

## 2018-06-11 DIAGNOSIS — N2581 Secondary hyperparathyroidism of renal origin: Secondary | ICD-10-CM | POA: Diagnosis not present

## 2018-06-14 DIAGNOSIS — N2581 Secondary hyperparathyroidism of renal origin: Secondary | ICD-10-CM | POA: Diagnosis not present

## 2018-06-14 DIAGNOSIS — N186 End stage renal disease: Secondary | ICD-10-CM | POA: Diagnosis not present

## 2018-06-16 DIAGNOSIS — N2581 Secondary hyperparathyroidism of renal origin: Secondary | ICD-10-CM | POA: Diagnosis not present

## 2018-06-16 DIAGNOSIS — N186 End stage renal disease: Secondary | ICD-10-CM | POA: Diagnosis not present

## 2018-06-18 DIAGNOSIS — N186 End stage renal disease: Secondary | ICD-10-CM | POA: Diagnosis not present

## 2018-06-18 DIAGNOSIS — N2581 Secondary hyperparathyroidism of renal origin: Secondary | ICD-10-CM | POA: Diagnosis not present

## 2018-06-21 DIAGNOSIS — N2581 Secondary hyperparathyroidism of renal origin: Secondary | ICD-10-CM | POA: Diagnosis not present

## 2018-06-21 DIAGNOSIS — N186 End stage renal disease: Secondary | ICD-10-CM | POA: Diagnosis not present

## 2018-06-23 DIAGNOSIS — N186 End stage renal disease: Secondary | ICD-10-CM | POA: Diagnosis not present

## 2018-06-23 DIAGNOSIS — N2581 Secondary hyperparathyroidism of renal origin: Secondary | ICD-10-CM | POA: Diagnosis not present

## 2018-06-25 DIAGNOSIS — N2581 Secondary hyperparathyroidism of renal origin: Secondary | ICD-10-CM | POA: Diagnosis not present

## 2018-06-25 DIAGNOSIS — J329 Chronic sinusitis, unspecified: Secondary | ICD-10-CM | POA: Diagnosis not present

## 2018-06-25 DIAGNOSIS — N186 End stage renal disease: Secondary | ICD-10-CM | POA: Diagnosis not present

## 2018-06-25 DIAGNOSIS — Z6831 Body mass index (BMI) 31.0-31.9, adult: Secondary | ICD-10-CM | POA: Diagnosis not present

## 2018-06-26 DIAGNOSIS — I129 Hypertensive chronic kidney disease with stage 1 through stage 4 chronic kidney disease, or unspecified chronic kidney disease: Secondary | ICD-10-CM | POA: Diagnosis not present

## 2018-06-26 DIAGNOSIS — Z992 Dependence on renal dialysis: Secondary | ICD-10-CM | POA: Diagnosis not present

## 2018-06-26 DIAGNOSIS — N186 End stage renal disease: Secondary | ICD-10-CM | POA: Diagnosis not present

## 2018-06-28 DIAGNOSIS — N186 End stage renal disease: Secondary | ICD-10-CM | POA: Diagnosis not present

## 2018-06-28 DIAGNOSIS — N2581 Secondary hyperparathyroidism of renal origin: Secondary | ICD-10-CM | POA: Diagnosis not present

## 2018-06-30 DIAGNOSIS — N186 End stage renal disease: Secondary | ICD-10-CM | POA: Diagnosis not present

## 2018-06-30 DIAGNOSIS — N2581 Secondary hyperparathyroidism of renal origin: Secondary | ICD-10-CM | POA: Diagnosis not present

## 2018-07-02 DIAGNOSIS — N186 End stage renal disease: Secondary | ICD-10-CM | POA: Diagnosis not present

## 2018-07-02 DIAGNOSIS — N2581 Secondary hyperparathyroidism of renal origin: Secondary | ICD-10-CM | POA: Diagnosis not present

## 2018-07-05 DIAGNOSIS — N2581 Secondary hyperparathyroidism of renal origin: Secondary | ICD-10-CM | POA: Diagnosis not present

## 2018-07-05 DIAGNOSIS — N186 End stage renal disease: Secondary | ICD-10-CM | POA: Diagnosis not present

## 2018-07-07 DIAGNOSIS — N2581 Secondary hyperparathyroidism of renal origin: Secondary | ICD-10-CM | POA: Diagnosis not present

## 2018-07-07 DIAGNOSIS — N186 End stage renal disease: Secondary | ICD-10-CM | POA: Diagnosis not present

## 2018-07-09 DIAGNOSIS — N2581 Secondary hyperparathyroidism of renal origin: Secondary | ICD-10-CM | POA: Diagnosis not present

## 2018-07-09 DIAGNOSIS — N186 End stage renal disease: Secondary | ICD-10-CM | POA: Diagnosis not present

## 2018-07-12 DIAGNOSIS — N186 End stage renal disease: Secondary | ICD-10-CM | POA: Diagnosis not present

## 2018-07-12 DIAGNOSIS — N2581 Secondary hyperparathyroidism of renal origin: Secondary | ICD-10-CM | POA: Diagnosis not present

## 2018-07-14 DIAGNOSIS — N186 End stage renal disease: Secondary | ICD-10-CM | POA: Diagnosis not present

## 2018-07-14 DIAGNOSIS — N2581 Secondary hyperparathyroidism of renal origin: Secondary | ICD-10-CM | POA: Diagnosis not present

## 2018-07-16 DIAGNOSIS — N186 End stage renal disease: Secondary | ICD-10-CM | POA: Diagnosis not present

## 2018-07-16 DIAGNOSIS — N2581 Secondary hyperparathyroidism of renal origin: Secondary | ICD-10-CM | POA: Diagnosis not present

## 2018-07-19 DIAGNOSIS — N2581 Secondary hyperparathyroidism of renal origin: Secondary | ICD-10-CM | POA: Diagnosis not present

## 2018-07-19 DIAGNOSIS — N186 End stage renal disease: Secondary | ICD-10-CM | POA: Diagnosis not present

## 2018-07-21 DIAGNOSIS — N2581 Secondary hyperparathyroidism of renal origin: Secondary | ICD-10-CM | POA: Diagnosis not present

## 2018-07-21 DIAGNOSIS — N186 End stage renal disease: Secondary | ICD-10-CM | POA: Diagnosis not present

## 2018-07-23 DIAGNOSIS — N2581 Secondary hyperparathyroidism of renal origin: Secondary | ICD-10-CM | POA: Diagnosis not present

## 2018-07-23 DIAGNOSIS — N186 End stage renal disease: Secondary | ICD-10-CM | POA: Diagnosis not present

## 2018-07-26 DIAGNOSIS — N2581 Secondary hyperparathyroidism of renal origin: Secondary | ICD-10-CM | POA: Diagnosis not present

## 2018-07-26 DIAGNOSIS — N186 End stage renal disease: Secondary | ICD-10-CM | POA: Diagnosis not present

## 2018-07-27 DIAGNOSIS — N186 End stage renal disease: Secondary | ICD-10-CM | POA: Diagnosis not present

## 2018-07-27 DIAGNOSIS — Z992 Dependence on renal dialysis: Secondary | ICD-10-CM | POA: Diagnosis not present

## 2018-07-27 DIAGNOSIS — I129 Hypertensive chronic kidney disease with stage 1 through stage 4 chronic kidney disease, or unspecified chronic kidney disease: Secondary | ICD-10-CM | POA: Diagnosis not present

## 2018-07-28 DIAGNOSIS — N2581 Secondary hyperparathyroidism of renal origin: Secondary | ICD-10-CM | POA: Diagnosis not present

## 2018-07-28 DIAGNOSIS — N186 End stage renal disease: Secondary | ICD-10-CM | POA: Diagnosis not present

## 2018-07-28 DIAGNOSIS — E8770 Fluid overload, unspecified: Secondary | ICD-10-CM | POA: Diagnosis not present

## 2018-07-30 DIAGNOSIS — E875 Hyperkalemia: Secondary | ICD-10-CM | POA: Diagnosis not present

## 2018-07-30 DIAGNOSIS — E8770 Fluid overload, unspecified: Secondary | ICD-10-CM | POA: Diagnosis not present

## 2018-07-30 DIAGNOSIS — N2581 Secondary hyperparathyroidism of renal origin: Secondary | ICD-10-CM | POA: Diagnosis not present

## 2018-07-30 DIAGNOSIS — N186 End stage renal disease: Secondary | ICD-10-CM | POA: Diagnosis not present

## 2018-07-31 DIAGNOSIS — N2581 Secondary hyperparathyroidism of renal origin: Secondary | ICD-10-CM | POA: Diagnosis not present

## 2018-07-31 DIAGNOSIS — N186 End stage renal disease: Secondary | ICD-10-CM | POA: Diagnosis not present

## 2018-07-31 DIAGNOSIS — E8770 Fluid overload, unspecified: Secondary | ICD-10-CM | POA: Diagnosis not present

## 2018-08-02 DIAGNOSIS — N2581 Secondary hyperparathyroidism of renal origin: Secondary | ICD-10-CM | POA: Diagnosis not present

## 2018-08-02 DIAGNOSIS — N186 End stage renal disease: Secondary | ICD-10-CM | POA: Diagnosis not present

## 2018-08-04 DIAGNOSIS — N2581 Secondary hyperparathyroidism of renal origin: Secondary | ICD-10-CM | POA: Diagnosis not present

## 2018-08-04 DIAGNOSIS — N186 End stage renal disease: Secondary | ICD-10-CM | POA: Diagnosis not present

## 2018-08-06 DIAGNOSIS — N186 End stage renal disease: Secondary | ICD-10-CM | POA: Diagnosis not present

## 2018-08-06 DIAGNOSIS — N2581 Secondary hyperparathyroidism of renal origin: Secondary | ICD-10-CM | POA: Diagnosis not present

## 2018-08-09 DIAGNOSIS — N186 End stage renal disease: Secondary | ICD-10-CM | POA: Diagnosis not present

## 2018-08-09 DIAGNOSIS — N2581 Secondary hyperparathyroidism of renal origin: Secondary | ICD-10-CM | POA: Diagnosis not present

## 2018-08-11 DIAGNOSIS — N186 End stage renal disease: Secondary | ICD-10-CM | POA: Diagnosis not present

## 2018-08-11 DIAGNOSIS — N2581 Secondary hyperparathyroidism of renal origin: Secondary | ICD-10-CM | POA: Diagnosis not present

## 2018-08-13 DIAGNOSIS — N186 End stage renal disease: Secondary | ICD-10-CM | POA: Diagnosis not present

## 2018-08-13 DIAGNOSIS — N2581 Secondary hyperparathyroidism of renal origin: Secondary | ICD-10-CM | POA: Diagnosis not present

## 2018-08-16 DIAGNOSIS — N186 End stage renal disease: Secondary | ICD-10-CM | POA: Diagnosis not present

## 2018-08-16 DIAGNOSIS — N2581 Secondary hyperparathyroidism of renal origin: Secondary | ICD-10-CM | POA: Diagnosis not present

## 2018-08-18 DIAGNOSIS — N2581 Secondary hyperparathyroidism of renal origin: Secondary | ICD-10-CM | POA: Diagnosis not present

## 2018-08-18 DIAGNOSIS — N186 End stage renal disease: Secondary | ICD-10-CM | POA: Diagnosis not present

## 2018-08-20 DIAGNOSIS — N186 End stage renal disease: Secondary | ICD-10-CM | POA: Diagnosis not present

## 2018-08-20 DIAGNOSIS — N2581 Secondary hyperparathyroidism of renal origin: Secondary | ICD-10-CM | POA: Diagnosis not present

## 2018-08-23 DIAGNOSIS — N2581 Secondary hyperparathyroidism of renal origin: Secondary | ICD-10-CM | POA: Diagnosis not present

## 2018-08-23 DIAGNOSIS — N186 End stage renal disease: Secondary | ICD-10-CM | POA: Diagnosis not present

## 2018-08-25 DIAGNOSIS — N186 End stage renal disease: Secondary | ICD-10-CM | POA: Diagnosis not present

## 2018-08-25 DIAGNOSIS — N2581 Secondary hyperparathyroidism of renal origin: Secondary | ICD-10-CM | POA: Diagnosis not present

## 2018-08-26 DIAGNOSIS — N186 End stage renal disease: Secondary | ICD-10-CM | POA: Diagnosis not present

## 2018-08-26 DIAGNOSIS — Z992 Dependence on renal dialysis: Secondary | ICD-10-CM | POA: Diagnosis not present

## 2018-08-26 DIAGNOSIS — I129 Hypertensive chronic kidney disease with stage 1 through stage 4 chronic kidney disease, or unspecified chronic kidney disease: Secondary | ICD-10-CM | POA: Diagnosis not present

## 2018-08-27 DIAGNOSIS — B372 Candidiasis of skin and nail: Secondary | ICD-10-CM | POA: Diagnosis not present

## 2018-08-27 DIAGNOSIS — N186 End stage renal disease: Secondary | ICD-10-CM | POA: Diagnosis not present

## 2018-08-27 DIAGNOSIS — N2581 Secondary hyperparathyroidism of renal origin: Secondary | ICD-10-CM | POA: Diagnosis not present

## 2018-08-30 DIAGNOSIS — N2581 Secondary hyperparathyroidism of renal origin: Secondary | ICD-10-CM | POA: Diagnosis not present

## 2018-08-30 DIAGNOSIS — N186 End stage renal disease: Secondary | ICD-10-CM | POA: Diagnosis not present

## 2018-09-01 ENCOUNTER — Encounter: Payer: Self-pay | Admitting: Internal Medicine

## 2018-09-01 DIAGNOSIS — N186 End stage renal disease: Secondary | ICD-10-CM | POA: Diagnosis not present

## 2018-09-01 DIAGNOSIS — N2581 Secondary hyperparathyroidism of renal origin: Secondary | ICD-10-CM | POA: Diagnosis not present

## 2018-09-03 DIAGNOSIS — N2581 Secondary hyperparathyroidism of renal origin: Secondary | ICD-10-CM | POA: Diagnosis not present

## 2018-09-03 DIAGNOSIS — N186 End stage renal disease: Secondary | ICD-10-CM | POA: Diagnosis not present

## 2018-09-06 DIAGNOSIS — N2581 Secondary hyperparathyroidism of renal origin: Secondary | ICD-10-CM | POA: Diagnosis not present

## 2018-09-06 DIAGNOSIS — N186 End stage renal disease: Secondary | ICD-10-CM | POA: Diagnosis not present

## 2018-09-08 DIAGNOSIS — N2581 Secondary hyperparathyroidism of renal origin: Secondary | ICD-10-CM | POA: Diagnosis not present

## 2018-09-08 DIAGNOSIS — N186 End stage renal disease: Secondary | ICD-10-CM | POA: Diagnosis not present

## 2018-09-10 DIAGNOSIS — N2581 Secondary hyperparathyroidism of renal origin: Secondary | ICD-10-CM | POA: Diagnosis not present

## 2018-09-10 DIAGNOSIS — N186 End stage renal disease: Secondary | ICD-10-CM | POA: Diagnosis not present

## 2018-09-13 DIAGNOSIS — N2581 Secondary hyperparathyroidism of renal origin: Secondary | ICD-10-CM | POA: Diagnosis not present

## 2018-09-13 DIAGNOSIS — N186 End stage renal disease: Secondary | ICD-10-CM | POA: Diagnosis not present

## 2018-09-15 DIAGNOSIS — N2581 Secondary hyperparathyroidism of renal origin: Secondary | ICD-10-CM | POA: Diagnosis not present

## 2018-09-15 DIAGNOSIS — N186 End stage renal disease: Secondary | ICD-10-CM | POA: Diagnosis not present

## 2018-09-17 DIAGNOSIS — N2581 Secondary hyperparathyroidism of renal origin: Secondary | ICD-10-CM | POA: Diagnosis not present

## 2018-09-17 DIAGNOSIS — N186 End stage renal disease: Secondary | ICD-10-CM | POA: Diagnosis not present

## 2018-09-20 DIAGNOSIS — N186 End stage renal disease: Secondary | ICD-10-CM | POA: Diagnosis not present

## 2018-09-20 DIAGNOSIS — N2581 Secondary hyperparathyroidism of renal origin: Secondary | ICD-10-CM | POA: Diagnosis not present

## 2018-09-22 DIAGNOSIS — N2581 Secondary hyperparathyroidism of renal origin: Secondary | ICD-10-CM | POA: Diagnosis not present

## 2018-09-22 DIAGNOSIS — N186 End stage renal disease: Secondary | ICD-10-CM | POA: Diagnosis not present

## 2018-09-24 DIAGNOSIS — N186 End stage renal disease: Secondary | ICD-10-CM | POA: Diagnosis not present

## 2018-09-24 DIAGNOSIS — N2581 Secondary hyperparathyroidism of renal origin: Secondary | ICD-10-CM | POA: Diagnosis not present

## 2018-09-26 DIAGNOSIS — Z992 Dependence on renal dialysis: Secondary | ICD-10-CM | POA: Diagnosis not present

## 2018-09-26 DIAGNOSIS — I129 Hypertensive chronic kidney disease with stage 1 through stage 4 chronic kidney disease, or unspecified chronic kidney disease: Secondary | ICD-10-CM | POA: Diagnosis not present

## 2018-09-26 DIAGNOSIS — N186 End stage renal disease: Secondary | ICD-10-CM | POA: Diagnosis not present

## 2018-09-27 DIAGNOSIS — N186 End stage renal disease: Secondary | ICD-10-CM | POA: Diagnosis not present

## 2018-09-27 DIAGNOSIS — N2581 Secondary hyperparathyroidism of renal origin: Secondary | ICD-10-CM | POA: Diagnosis not present

## 2018-09-29 DIAGNOSIS — N2581 Secondary hyperparathyroidism of renal origin: Secondary | ICD-10-CM | POA: Diagnosis not present

## 2018-09-29 DIAGNOSIS — N186 End stage renal disease: Secondary | ICD-10-CM | POA: Diagnosis not present

## 2018-10-01 DIAGNOSIS — N2581 Secondary hyperparathyroidism of renal origin: Secondary | ICD-10-CM | POA: Diagnosis not present

## 2018-10-01 DIAGNOSIS — N186 End stage renal disease: Secondary | ICD-10-CM | POA: Diagnosis not present

## 2018-10-04 DIAGNOSIS — N186 End stage renal disease: Secondary | ICD-10-CM | POA: Diagnosis not present

## 2018-10-04 DIAGNOSIS — N2581 Secondary hyperparathyroidism of renal origin: Secondary | ICD-10-CM | POA: Diagnosis not present

## 2018-10-06 DIAGNOSIS — N2581 Secondary hyperparathyroidism of renal origin: Secondary | ICD-10-CM | POA: Diagnosis not present

## 2018-10-06 DIAGNOSIS — N186 End stage renal disease: Secondary | ICD-10-CM | POA: Diagnosis not present

## 2018-10-08 DIAGNOSIS — N186 End stage renal disease: Secondary | ICD-10-CM | POA: Diagnosis not present

## 2018-10-08 DIAGNOSIS — N2581 Secondary hyperparathyroidism of renal origin: Secondary | ICD-10-CM | POA: Diagnosis not present

## 2018-10-11 DIAGNOSIS — N186 End stage renal disease: Secondary | ICD-10-CM | POA: Diagnosis not present

## 2018-10-11 DIAGNOSIS — N2581 Secondary hyperparathyroidism of renal origin: Secondary | ICD-10-CM | POA: Diagnosis not present

## 2018-10-13 DIAGNOSIS — N2581 Secondary hyperparathyroidism of renal origin: Secondary | ICD-10-CM | POA: Diagnosis not present

## 2018-10-13 DIAGNOSIS — N186 End stage renal disease: Secondary | ICD-10-CM | POA: Diagnosis not present

## 2018-10-15 DIAGNOSIS — N186 End stage renal disease: Secondary | ICD-10-CM | POA: Diagnosis not present

## 2018-10-15 DIAGNOSIS — N2581 Secondary hyperparathyroidism of renal origin: Secondary | ICD-10-CM | POA: Diagnosis not present

## 2018-10-18 DIAGNOSIS — N186 End stage renal disease: Secondary | ICD-10-CM | POA: Diagnosis not present

## 2018-10-18 DIAGNOSIS — N2581 Secondary hyperparathyroidism of renal origin: Secondary | ICD-10-CM | POA: Diagnosis not present

## 2018-10-20 DIAGNOSIS — N186 End stage renal disease: Secondary | ICD-10-CM | POA: Diagnosis not present

## 2018-10-20 DIAGNOSIS — N2581 Secondary hyperparathyroidism of renal origin: Secondary | ICD-10-CM | POA: Diagnosis not present

## 2018-10-22 DIAGNOSIS — N2581 Secondary hyperparathyroidism of renal origin: Secondary | ICD-10-CM | POA: Diagnosis not present

## 2018-10-22 DIAGNOSIS — N186 End stage renal disease: Secondary | ICD-10-CM | POA: Diagnosis not present

## 2018-10-25 DIAGNOSIS — N186 End stage renal disease: Secondary | ICD-10-CM | POA: Diagnosis not present

## 2018-10-25 DIAGNOSIS — N2581 Secondary hyperparathyroidism of renal origin: Secondary | ICD-10-CM | POA: Diagnosis not present

## 2018-10-26 DIAGNOSIS — N186 End stage renal disease: Secondary | ICD-10-CM | POA: Diagnosis not present

## 2018-10-26 DIAGNOSIS — I129 Hypertensive chronic kidney disease with stage 1 through stage 4 chronic kidney disease, or unspecified chronic kidney disease: Secondary | ICD-10-CM | POA: Diagnosis not present

## 2018-10-26 DIAGNOSIS — Z992 Dependence on renal dialysis: Secondary | ICD-10-CM | POA: Diagnosis not present

## 2018-10-27 DIAGNOSIS — N2581 Secondary hyperparathyroidism of renal origin: Secondary | ICD-10-CM | POA: Diagnosis not present

## 2018-10-27 DIAGNOSIS — N186 End stage renal disease: Secondary | ICD-10-CM | POA: Diagnosis not present

## 2018-10-29 DIAGNOSIS — N2581 Secondary hyperparathyroidism of renal origin: Secondary | ICD-10-CM | POA: Diagnosis not present

## 2018-10-29 DIAGNOSIS — N186 End stage renal disease: Secondary | ICD-10-CM | POA: Diagnosis not present

## 2018-11-01 DIAGNOSIS — N186 End stage renal disease: Secondary | ICD-10-CM | POA: Diagnosis not present

## 2018-11-01 DIAGNOSIS — N2581 Secondary hyperparathyroidism of renal origin: Secondary | ICD-10-CM | POA: Diagnosis not present

## 2018-11-03 DIAGNOSIS — N186 End stage renal disease: Secondary | ICD-10-CM | POA: Diagnosis not present

## 2018-11-03 DIAGNOSIS — N2581 Secondary hyperparathyroidism of renal origin: Secondary | ICD-10-CM | POA: Diagnosis not present

## 2018-11-05 DIAGNOSIS — N2581 Secondary hyperparathyroidism of renal origin: Secondary | ICD-10-CM | POA: Diagnosis not present

## 2018-11-05 DIAGNOSIS — N186 End stage renal disease: Secondary | ICD-10-CM | POA: Diagnosis not present

## 2018-11-08 DIAGNOSIS — N186 End stage renal disease: Secondary | ICD-10-CM | POA: Diagnosis not present

## 2018-11-08 DIAGNOSIS — N2581 Secondary hyperparathyroidism of renal origin: Secondary | ICD-10-CM | POA: Diagnosis not present

## 2018-11-10 DIAGNOSIS — N2581 Secondary hyperparathyroidism of renal origin: Secondary | ICD-10-CM | POA: Diagnosis not present

## 2018-11-10 DIAGNOSIS — N186 End stage renal disease: Secondary | ICD-10-CM | POA: Diagnosis not present

## 2018-11-12 DIAGNOSIS — N186 End stage renal disease: Secondary | ICD-10-CM | POA: Diagnosis not present

## 2018-11-12 DIAGNOSIS — N2581 Secondary hyperparathyroidism of renal origin: Secondary | ICD-10-CM | POA: Diagnosis not present

## 2018-11-15 DIAGNOSIS — N2581 Secondary hyperparathyroidism of renal origin: Secondary | ICD-10-CM | POA: Diagnosis not present

## 2018-11-15 DIAGNOSIS — N186 End stage renal disease: Secondary | ICD-10-CM | POA: Diagnosis not present

## 2018-11-17 DIAGNOSIS — N2581 Secondary hyperparathyroidism of renal origin: Secondary | ICD-10-CM | POA: Diagnosis not present

## 2018-11-17 DIAGNOSIS — N186 End stage renal disease: Secondary | ICD-10-CM | POA: Diagnosis not present

## 2018-11-19 DIAGNOSIS — N186 End stage renal disease: Secondary | ICD-10-CM | POA: Diagnosis not present

## 2018-11-19 DIAGNOSIS — N2581 Secondary hyperparathyroidism of renal origin: Secondary | ICD-10-CM | POA: Diagnosis not present

## 2018-11-22 DIAGNOSIS — N2581 Secondary hyperparathyroidism of renal origin: Secondary | ICD-10-CM | POA: Diagnosis not present

## 2018-11-22 DIAGNOSIS — R197 Diarrhea, unspecified: Secondary | ICD-10-CM | POA: Diagnosis not present

## 2018-11-22 DIAGNOSIS — N186 End stage renal disease: Secondary | ICD-10-CM | POA: Diagnosis not present

## 2018-11-24 DIAGNOSIS — N186 End stage renal disease: Secondary | ICD-10-CM | POA: Diagnosis not present

## 2018-11-24 DIAGNOSIS — R197 Diarrhea, unspecified: Secondary | ICD-10-CM | POA: Diagnosis not present

## 2018-11-24 DIAGNOSIS — N2581 Secondary hyperparathyroidism of renal origin: Secondary | ICD-10-CM | POA: Diagnosis not present

## 2018-11-26 DIAGNOSIS — N2581 Secondary hyperparathyroidism of renal origin: Secondary | ICD-10-CM | POA: Diagnosis not present

## 2018-11-26 DIAGNOSIS — Z992 Dependence on renal dialysis: Secondary | ICD-10-CM | POA: Diagnosis not present

## 2018-11-26 DIAGNOSIS — R197 Diarrhea, unspecified: Secondary | ICD-10-CM | POA: Diagnosis not present

## 2018-11-26 DIAGNOSIS — N186 End stage renal disease: Secondary | ICD-10-CM | POA: Diagnosis not present

## 2018-11-26 DIAGNOSIS — I129 Hypertensive chronic kidney disease with stage 1 through stage 4 chronic kidney disease, or unspecified chronic kidney disease: Secondary | ICD-10-CM | POA: Diagnosis not present

## 2018-11-29 DIAGNOSIS — N2581 Secondary hyperparathyroidism of renal origin: Secondary | ICD-10-CM | POA: Diagnosis not present

## 2018-11-29 DIAGNOSIS — Z992 Dependence on renal dialysis: Secondary | ICD-10-CM | POA: Diagnosis not present

## 2018-11-29 DIAGNOSIS — N186 End stage renal disease: Secondary | ICD-10-CM | POA: Diagnosis not present

## 2018-12-01 DIAGNOSIS — Z992 Dependence on renal dialysis: Secondary | ICD-10-CM | POA: Diagnosis not present

## 2018-12-01 DIAGNOSIS — N186 End stage renal disease: Secondary | ICD-10-CM | POA: Diagnosis not present

## 2018-12-01 DIAGNOSIS — N2581 Secondary hyperparathyroidism of renal origin: Secondary | ICD-10-CM | POA: Diagnosis not present

## 2018-12-03 DIAGNOSIS — N2581 Secondary hyperparathyroidism of renal origin: Secondary | ICD-10-CM | POA: Diagnosis not present

## 2018-12-03 DIAGNOSIS — Z992 Dependence on renal dialysis: Secondary | ICD-10-CM | POA: Diagnosis not present

## 2018-12-03 DIAGNOSIS — N186 End stage renal disease: Secondary | ICD-10-CM | POA: Diagnosis not present

## 2018-12-06 DIAGNOSIS — N186 End stage renal disease: Secondary | ICD-10-CM | POA: Diagnosis not present

## 2018-12-06 DIAGNOSIS — Z992 Dependence on renal dialysis: Secondary | ICD-10-CM | POA: Diagnosis not present

## 2018-12-06 DIAGNOSIS — N2581 Secondary hyperparathyroidism of renal origin: Secondary | ICD-10-CM | POA: Diagnosis not present

## 2018-12-08 DIAGNOSIS — N2581 Secondary hyperparathyroidism of renal origin: Secondary | ICD-10-CM | POA: Diagnosis not present

## 2018-12-08 DIAGNOSIS — N186 End stage renal disease: Secondary | ICD-10-CM | POA: Diagnosis not present

## 2018-12-08 DIAGNOSIS — Z992 Dependence on renal dialysis: Secondary | ICD-10-CM | POA: Diagnosis not present

## 2018-12-10 DIAGNOSIS — N2581 Secondary hyperparathyroidism of renal origin: Secondary | ICD-10-CM | POA: Diagnosis not present

## 2018-12-10 DIAGNOSIS — N186 End stage renal disease: Secondary | ICD-10-CM | POA: Diagnosis not present

## 2018-12-10 DIAGNOSIS — Z992 Dependence on renal dialysis: Secondary | ICD-10-CM | POA: Diagnosis not present

## 2018-12-13 DIAGNOSIS — N2581 Secondary hyperparathyroidism of renal origin: Secondary | ICD-10-CM | POA: Diagnosis not present

## 2018-12-13 DIAGNOSIS — N186 End stage renal disease: Secondary | ICD-10-CM | POA: Diagnosis not present

## 2018-12-13 DIAGNOSIS — Z992 Dependence on renal dialysis: Secondary | ICD-10-CM | POA: Diagnosis not present

## 2018-12-15 DIAGNOSIS — N186 End stage renal disease: Secondary | ICD-10-CM | POA: Diagnosis not present

## 2018-12-15 DIAGNOSIS — N2581 Secondary hyperparathyroidism of renal origin: Secondary | ICD-10-CM | POA: Diagnosis not present

## 2018-12-15 DIAGNOSIS — Z992 Dependence on renal dialysis: Secondary | ICD-10-CM | POA: Diagnosis not present

## 2018-12-17 DIAGNOSIS — N2581 Secondary hyperparathyroidism of renal origin: Secondary | ICD-10-CM | POA: Diagnosis not present

## 2018-12-17 DIAGNOSIS — Z992 Dependence on renal dialysis: Secondary | ICD-10-CM | POA: Diagnosis not present

## 2018-12-17 DIAGNOSIS — N186 End stage renal disease: Secondary | ICD-10-CM | POA: Diagnosis not present

## 2018-12-20 DIAGNOSIS — N2581 Secondary hyperparathyroidism of renal origin: Secondary | ICD-10-CM | POA: Diagnosis not present

## 2018-12-20 DIAGNOSIS — N186 End stage renal disease: Secondary | ICD-10-CM | POA: Diagnosis not present

## 2018-12-20 DIAGNOSIS — Z992 Dependence on renal dialysis: Secondary | ICD-10-CM | POA: Diagnosis not present

## 2018-12-22 DIAGNOSIS — N186 End stage renal disease: Secondary | ICD-10-CM | POA: Diagnosis not present

## 2018-12-22 DIAGNOSIS — N2581 Secondary hyperparathyroidism of renal origin: Secondary | ICD-10-CM | POA: Diagnosis not present

## 2018-12-22 DIAGNOSIS — Z992 Dependence on renal dialysis: Secondary | ICD-10-CM | POA: Diagnosis not present

## 2018-12-24 DIAGNOSIS — Z992 Dependence on renal dialysis: Secondary | ICD-10-CM | POA: Diagnosis not present

## 2018-12-24 DIAGNOSIS — N186 End stage renal disease: Secondary | ICD-10-CM | POA: Diagnosis not present

## 2018-12-24 DIAGNOSIS — N2581 Secondary hyperparathyroidism of renal origin: Secondary | ICD-10-CM | POA: Diagnosis not present

## 2018-12-27 DIAGNOSIS — N186 End stage renal disease: Secondary | ICD-10-CM | POA: Diagnosis not present

## 2018-12-27 DIAGNOSIS — N2581 Secondary hyperparathyroidism of renal origin: Secondary | ICD-10-CM | POA: Diagnosis not present

## 2018-12-27 DIAGNOSIS — I129 Hypertensive chronic kidney disease with stage 1 through stage 4 chronic kidney disease, or unspecified chronic kidney disease: Secondary | ICD-10-CM | POA: Diagnosis not present

## 2018-12-27 DIAGNOSIS — Z992 Dependence on renal dialysis: Secondary | ICD-10-CM | POA: Diagnosis not present

## 2018-12-29 DIAGNOSIS — N186 End stage renal disease: Secondary | ICD-10-CM | POA: Diagnosis not present

## 2018-12-29 DIAGNOSIS — N2581 Secondary hyperparathyroidism of renal origin: Secondary | ICD-10-CM | POA: Diagnosis not present

## 2018-12-29 DIAGNOSIS — Z992 Dependence on renal dialysis: Secondary | ICD-10-CM | POA: Diagnosis not present

## 2018-12-31 DIAGNOSIS — N186 End stage renal disease: Secondary | ICD-10-CM | POA: Diagnosis not present

## 2018-12-31 DIAGNOSIS — Z992 Dependence on renal dialysis: Secondary | ICD-10-CM | POA: Diagnosis not present

## 2018-12-31 DIAGNOSIS — N2581 Secondary hyperparathyroidism of renal origin: Secondary | ICD-10-CM | POA: Diagnosis not present

## 2019-01-03 DIAGNOSIS — N2581 Secondary hyperparathyroidism of renal origin: Secondary | ICD-10-CM | POA: Diagnosis not present

## 2019-01-03 DIAGNOSIS — N186 End stage renal disease: Secondary | ICD-10-CM | POA: Diagnosis not present

## 2019-01-03 DIAGNOSIS — Z992 Dependence on renal dialysis: Secondary | ICD-10-CM | POA: Diagnosis not present

## 2019-01-05 DIAGNOSIS — N2581 Secondary hyperparathyroidism of renal origin: Secondary | ICD-10-CM | POA: Diagnosis not present

## 2019-01-05 DIAGNOSIS — N186 End stage renal disease: Secondary | ICD-10-CM | POA: Diagnosis not present

## 2019-01-05 DIAGNOSIS — Z992 Dependence on renal dialysis: Secondary | ICD-10-CM | POA: Diagnosis not present

## 2019-01-07 DIAGNOSIS — N186 End stage renal disease: Secondary | ICD-10-CM | POA: Diagnosis not present

## 2019-01-07 DIAGNOSIS — N2581 Secondary hyperparathyroidism of renal origin: Secondary | ICD-10-CM | POA: Diagnosis not present

## 2019-01-07 DIAGNOSIS — Z992 Dependence on renal dialysis: Secondary | ICD-10-CM | POA: Diagnosis not present

## 2019-01-10 DIAGNOSIS — N186 End stage renal disease: Secondary | ICD-10-CM | POA: Diagnosis not present

## 2019-01-10 DIAGNOSIS — Z992 Dependence on renal dialysis: Secondary | ICD-10-CM | POA: Diagnosis not present

## 2019-01-10 DIAGNOSIS — N2581 Secondary hyperparathyroidism of renal origin: Secondary | ICD-10-CM | POA: Diagnosis not present

## 2019-01-10 IMAGING — CT CT ABD-PEL WO/W CM
2 of 11 series · 9 of 46 positions shown, 15 images · IV contrast (APPLIED)
Comparison: Must cell a Sim, ultrasound exam 06/14/2016

CLINICAL DATA: Renal mass on ultrasound.

EXAM:
CT ABDOMEN AND PELVIS WITHOUT AND WITH CONTRAST
TECHNIQUE: Multidetector CT imaging of the abdomen and pelvis was performed
following the standard protocol before and following the bolus
administration of intravenous contrast.
CONTRAST:  100mL ZIJ0UU-EPP IOPAMIDOL (ZIJ0UU-EPP) INJECTION 61%

[Series 3: pre contrast 5.0 i30f 1 · axial · non-contrast · 0.76mm/px · z∈[+815,+1200]mm · 7 of 103 slices shown, 12 images]
[im 13/103  soft-tissue]
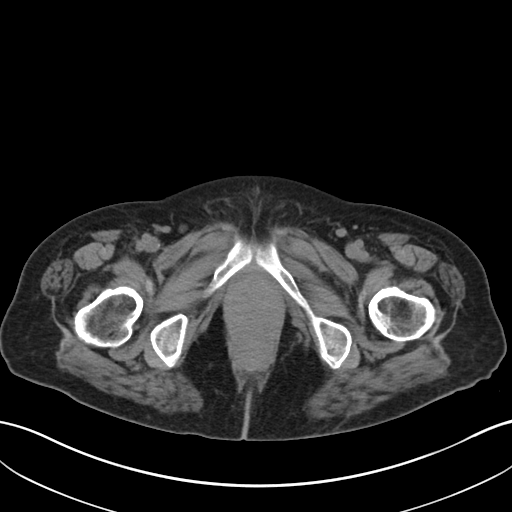
[im 13/103  bone]
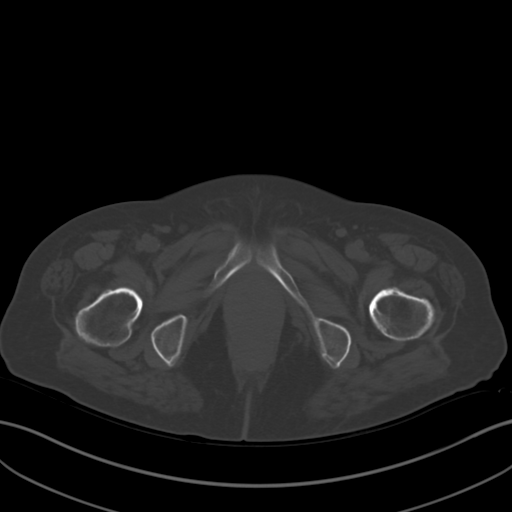
[im 26/103  soft-tissue]
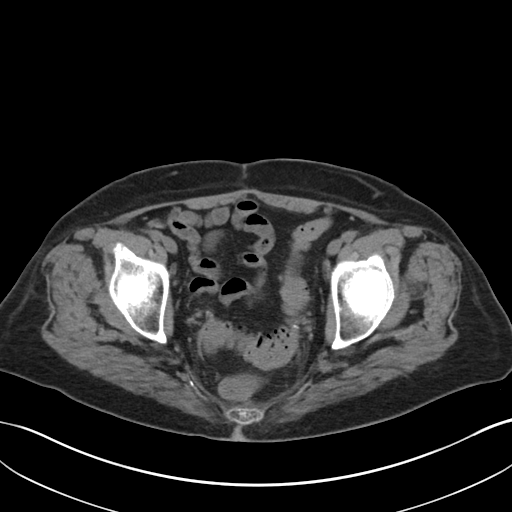
[im 39/103  soft-tissue]
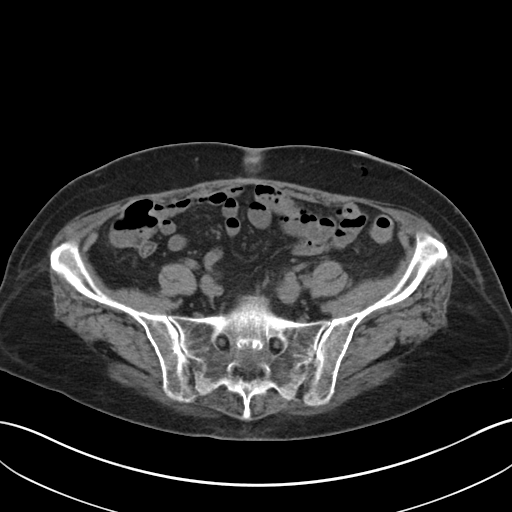
[im 52/103  soft-tissue]
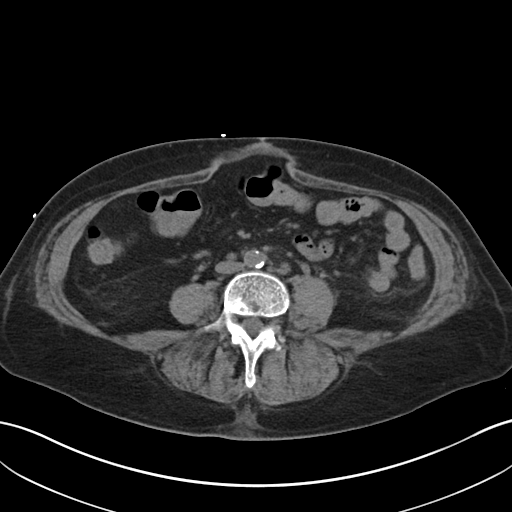
[im 52/103  lung]
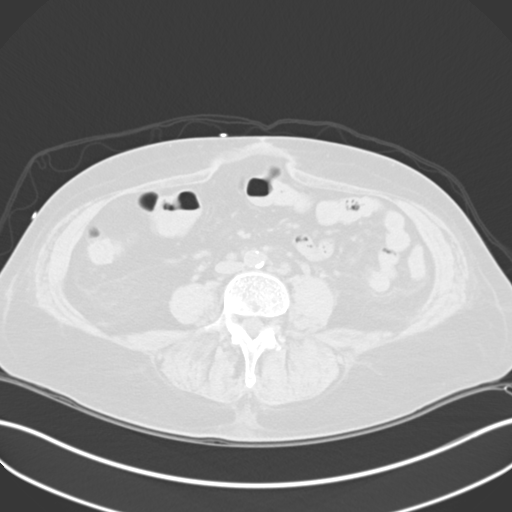
[im 64/103  soft-tissue]
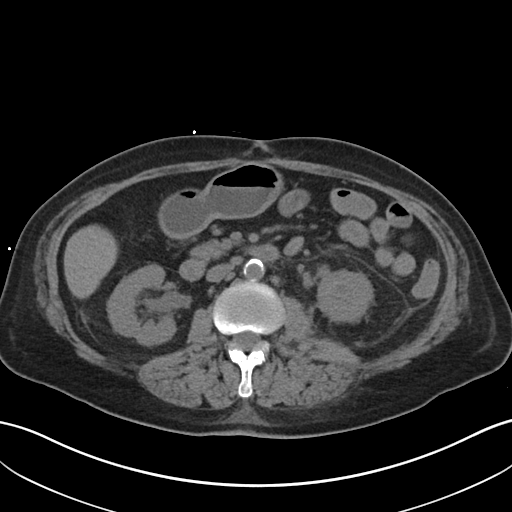
[im 64/103  lung]
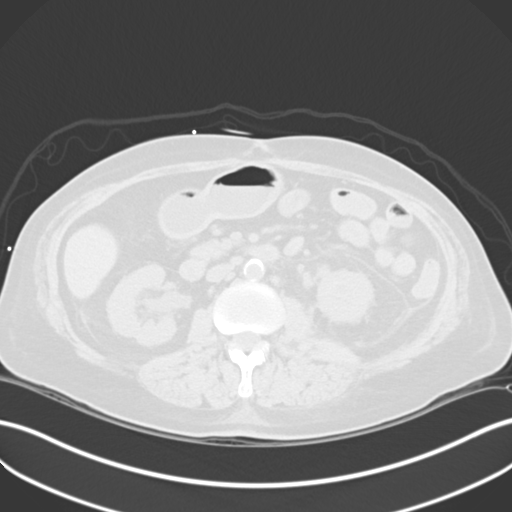
[im 77/103  soft-tissue]
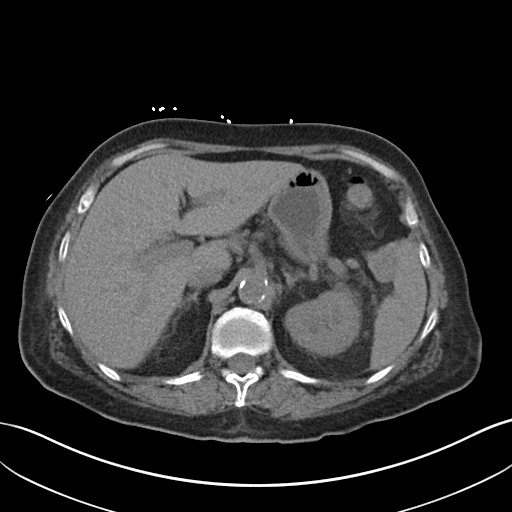
[im 77/103  lung]
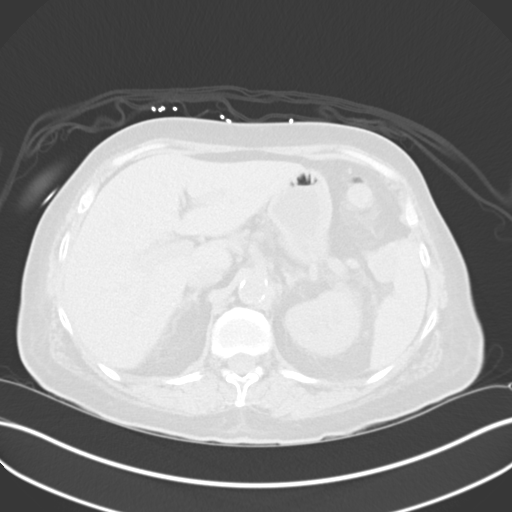
[im 90/103  soft-tissue]
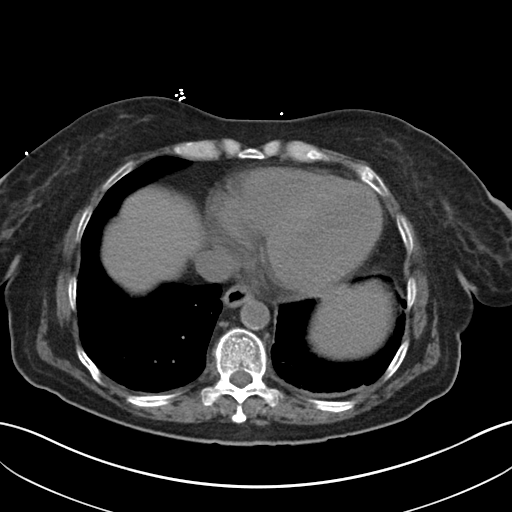
[im 90/103  lung]
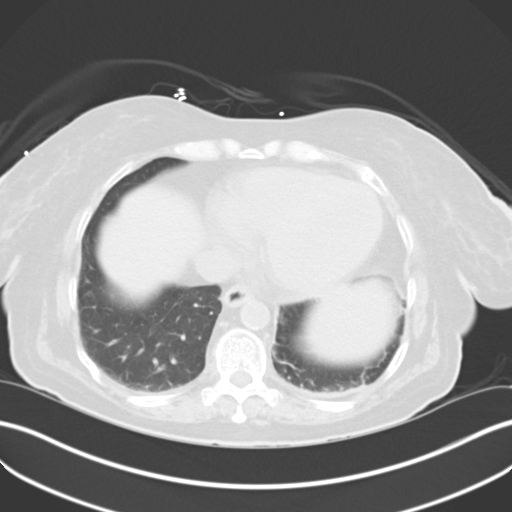

[Series 11: coronal · coronal · 0.68mm/px · 2 of 75 slices shown, 3 images]
[im 25/75  soft-tissue]
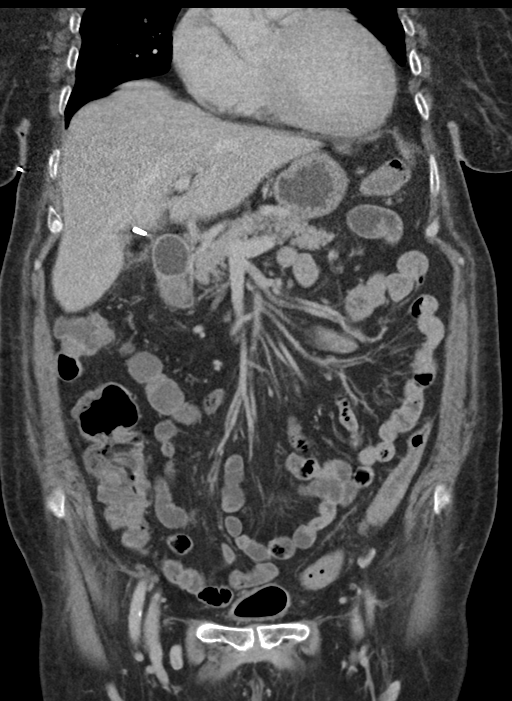
[im 25/75  bone]
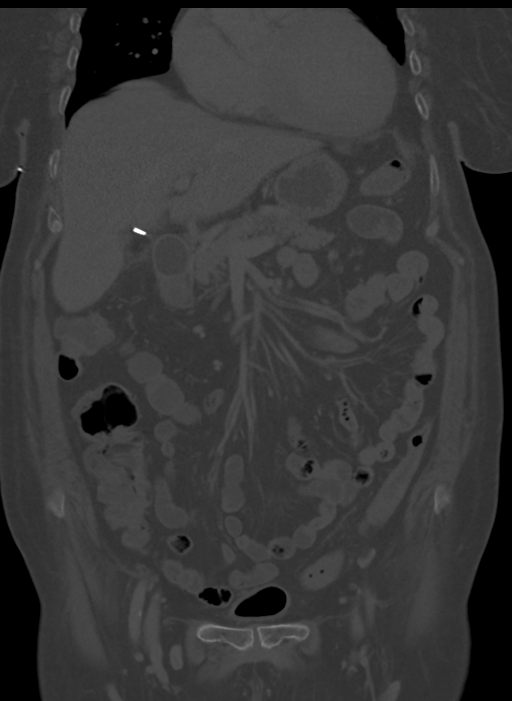
[im 50/75  soft-tissue]
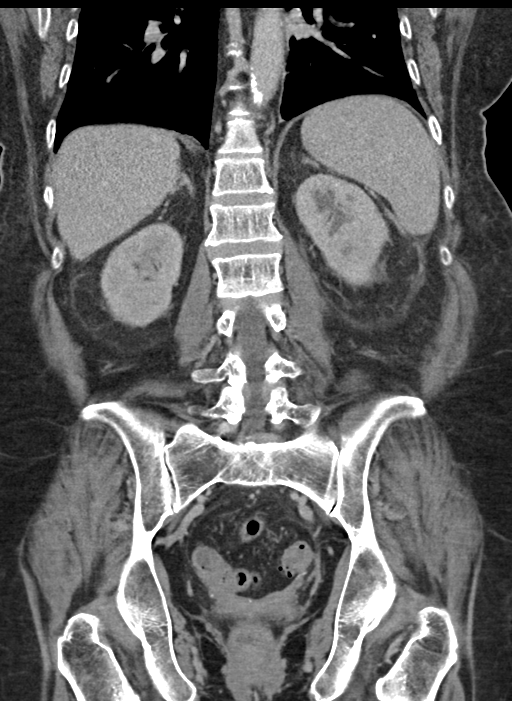

[9 of 46 positions shown; findings below may reference images not displayed]

FINDINGS: Lower chest:  Unremarkable.

Hepatobiliary: No focal abnormality within the liver parenchyma.
Gallbladder surgically absent. No intrahepatic or extrahepatic
biliary dilation.

Pancreas: No focal mass lesion. No dilatation of the main duct. No
intraparenchymal cyst. No peripancreatic edema.

Spleen: No splenomegaly. No focal mass lesion.

Adrenals/Urinary Tract: No adrenal nodule or mass.

Wall thickening is noted in the right renal pelvis (see image 42
series 13. No discrete intraparenchymal mass lesion identified
within the right kidney.

Left kidney demonstrates heterogeneous enhancement mainly in the
upper pole and interpolar region and there is prominent left
perinephric edema/inflammation. A multiloculated 4.0 x 2.0 x 3.7 cm
rim enhancing fluid collections identified anterior to the
interpolar left kidney and tracks down towards the lower pole. This
is associated with wall thickening in the renal pelvis and renal
calices of the left kidney.

Contrast excretion is delayed from both kidneys. No evidence for
hydroureter.

Circumferential bladder wall thickening noted. Gas in the urinary
bladder may be related to recent instrumentation or infection.

Stomach/Bowel: Stomach is nondistended. No gastric wall thickening.
No evidence of outlet obstruction. Duodenum is normally positioned
as is the ligament of Treitz. No small bowel wall thickening. No
small bowel dilatation. The terminal ileum is normal. The appendix
is not visualized, but there is no edema or inflammation in the
region of the cecum. Diverticular changes are noted in the left
colon without evidence of diverticulitis.

Vascular/Lymphatic: There is abdominal aortic atherosclerosis
without aneurysm. Small lymph nodes identified gastrohepatic and
hepatoduodenal ligaments. Small left para-aortic lymph nodes are
identified and measure up to 9 mm short axis which is borderline
increased. No pelvic sidewall lymphadenopathy.

Reproductive: Uterus surgically absent.  There is no adnexal mass.

Other: No intraperitoneal free fluid.

Musculoskeletal: Midline fat show laxity evident. Bone windows
reveal no worrisome lytic or sclerotic osseous lesions.
IMPRESSION: 1. Wall thickening identified and the renal pelvis of each kidney
with probable caliceal wall thickening in the left kidney. Changes
are associated with circumferential bladder wall thickening and gas
in the bladder lumen. Given bilateral involvement, infectious
etiology favored over other possibilities such as urothelial
neoplasm.
2. Heterogeneous perfusion left kidney likely related to
infection/pyelonephritis.
3. 4.0 x 2.0 x 3.7 cm rim enhancing multiloculated fluid collection
identified in the perinephric fat anterior to the left kidney.
Perinephric abscess could have this appearance. Given delayed
contrast excretion, the left intrarenal collecting system and renal
pelvis is not opacified despite repeat delayed imaging, and urinoma
cannot be entirely excluded.
4. Borderline retroperitoneal lymphadenopathy in the abdomen.

## 2019-01-12 DIAGNOSIS — N186 End stage renal disease: Secondary | ICD-10-CM | POA: Diagnosis not present

## 2019-01-12 DIAGNOSIS — Z992 Dependence on renal dialysis: Secondary | ICD-10-CM | POA: Diagnosis not present

## 2019-01-12 DIAGNOSIS — N2581 Secondary hyperparathyroidism of renal origin: Secondary | ICD-10-CM | POA: Diagnosis not present

## 2019-01-14 DIAGNOSIS — Z992 Dependence on renal dialysis: Secondary | ICD-10-CM | POA: Diagnosis not present

## 2019-01-14 DIAGNOSIS — N186 End stage renal disease: Secondary | ICD-10-CM | POA: Diagnosis not present

## 2019-01-14 DIAGNOSIS — N2581 Secondary hyperparathyroidism of renal origin: Secondary | ICD-10-CM | POA: Diagnosis not present

## 2019-01-17 DIAGNOSIS — N186 End stage renal disease: Secondary | ICD-10-CM | POA: Diagnosis not present

## 2019-01-17 DIAGNOSIS — N2581 Secondary hyperparathyroidism of renal origin: Secondary | ICD-10-CM | POA: Diagnosis not present

## 2019-01-17 DIAGNOSIS — Z992 Dependence on renal dialysis: Secondary | ICD-10-CM | POA: Diagnosis not present

## 2019-01-19 DIAGNOSIS — Z992 Dependence on renal dialysis: Secondary | ICD-10-CM | POA: Diagnosis not present

## 2019-01-19 DIAGNOSIS — N186 End stage renal disease: Secondary | ICD-10-CM | POA: Diagnosis not present

## 2019-01-19 DIAGNOSIS — N2581 Secondary hyperparathyroidism of renal origin: Secondary | ICD-10-CM | POA: Diagnosis not present

## 2019-01-21 DIAGNOSIS — N186 End stage renal disease: Secondary | ICD-10-CM | POA: Diagnosis not present

## 2019-01-21 DIAGNOSIS — Z992 Dependence on renal dialysis: Secondary | ICD-10-CM | POA: Diagnosis not present

## 2019-01-21 DIAGNOSIS — N2581 Secondary hyperparathyroidism of renal origin: Secondary | ICD-10-CM | POA: Diagnosis not present

## 2019-01-24 DIAGNOSIS — Z992 Dependence on renal dialysis: Secondary | ICD-10-CM | POA: Diagnosis not present

## 2019-01-24 DIAGNOSIS — N186 End stage renal disease: Secondary | ICD-10-CM | POA: Diagnosis not present

## 2019-01-24 DIAGNOSIS — N2581 Secondary hyperparathyroidism of renal origin: Secondary | ICD-10-CM | POA: Diagnosis not present

## 2019-01-26 DIAGNOSIS — Z992 Dependence on renal dialysis: Secondary | ICD-10-CM | POA: Diagnosis not present

## 2019-01-26 DIAGNOSIS — N186 End stage renal disease: Secondary | ICD-10-CM | POA: Diagnosis not present

## 2019-01-26 DIAGNOSIS — I129 Hypertensive chronic kidney disease with stage 1 through stage 4 chronic kidney disease, or unspecified chronic kidney disease: Secondary | ICD-10-CM | POA: Diagnosis not present

## 2019-01-26 DIAGNOSIS — N2581 Secondary hyperparathyroidism of renal origin: Secondary | ICD-10-CM | POA: Diagnosis not present

## 2019-01-27 DIAGNOSIS — I871 Compression of vein: Secondary | ICD-10-CM | POA: Diagnosis not present

## 2019-01-27 DIAGNOSIS — T82858A Stenosis of vascular prosthetic devices, implants and grafts, initial encounter: Secondary | ICD-10-CM | POA: Diagnosis not present

## 2019-01-27 DIAGNOSIS — Z992 Dependence on renal dialysis: Secondary | ICD-10-CM | POA: Diagnosis not present

## 2019-01-27 DIAGNOSIS — N186 End stage renal disease: Secondary | ICD-10-CM | POA: Diagnosis not present

## 2019-01-28 DIAGNOSIS — N186 End stage renal disease: Secondary | ICD-10-CM | POA: Diagnosis not present

## 2019-01-28 DIAGNOSIS — N2581 Secondary hyperparathyroidism of renal origin: Secondary | ICD-10-CM | POA: Diagnosis not present

## 2019-01-28 DIAGNOSIS — Z992 Dependence on renal dialysis: Secondary | ICD-10-CM | POA: Diagnosis not present

## 2019-01-31 DIAGNOSIS — Z23 Encounter for immunization: Secondary | ICD-10-CM | POA: Diagnosis not present

## 2019-01-31 DIAGNOSIS — N186 End stage renal disease: Secondary | ICD-10-CM | POA: Diagnosis not present

## 2019-01-31 DIAGNOSIS — N2581 Secondary hyperparathyroidism of renal origin: Secondary | ICD-10-CM | POA: Diagnosis not present

## 2019-01-31 DIAGNOSIS — Z992 Dependence on renal dialysis: Secondary | ICD-10-CM | POA: Diagnosis not present

## 2019-02-02 DIAGNOSIS — Z992 Dependence on renal dialysis: Secondary | ICD-10-CM | POA: Diagnosis not present

## 2019-02-02 DIAGNOSIS — N2581 Secondary hyperparathyroidism of renal origin: Secondary | ICD-10-CM | POA: Diagnosis not present

## 2019-02-02 DIAGNOSIS — Z23 Encounter for immunization: Secondary | ICD-10-CM | POA: Diagnosis not present

## 2019-02-02 DIAGNOSIS — N186 End stage renal disease: Secondary | ICD-10-CM | POA: Diagnosis not present

## 2019-02-04 DIAGNOSIS — Z992 Dependence on renal dialysis: Secondary | ICD-10-CM | POA: Diagnosis not present

## 2019-02-04 DIAGNOSIS — N186 End stage renal disease: Secondary | ICD-10-CM | POA: Diagnosis not present

## 2019-02-04 DIAGNOSIS — N2581 Secondary hyperparathyroidism of renal origin: Secondary | ICD-10-CM | POA: Diagnosis not present

## 2019-02-04 DIAGNOSIS — Z23 Encounter for immunization: Secondary | ICD-10-CM | POA: Diagnosis not present

## 2019-02-07 DIAGNOSIS — N2581 Secondary hyperparathyroidism of renal origin: Secondary | ICD-10-CM | POA: Diagnosis not present

## 2019-02-07 DIAGNOSIS — Z992 Dependence on renal dialysis: Secondary | ICD-10-CM | POA: Diagnosis not present

## 2019-02-07 DIAGNOSIS — N186 End stage renal disease: Secondary | ICD-10-CM | POA: Diagnosis not present

## 2019-02-09 DIAGNOSIS — N2581 Secondary hyperparathyroidism of renal origin: Secondary | ICD-10-CM | POA: Diagnosis not present

## 2019-02-09 DIAGNOSIS — N186 End stage renal disease: Secondary | ICD-10-CM | POA: Diagnosis not present

## 2019-02-09 DIAGNOSIS — Z992 Dependence on renal dialysis: Secondary | ICD-10-CM | POA: Diagnosis not present

## 2019-02-11 DIAGNOSIS — Z992 Dependence on renal dialysis: Secondary | ICD-10-CM | POA: Diagnosis not present

## 2019-02-11 DIAGNOSIS — N186 End stage renal disease: Secondary | ICD-10-CM | POA: Diagnosis not present

## 2019-02-11 DIAGNOSIS — N2581 Secondary hyperparathyroidism of renal origin: Secondary | ICD-10-CM | POA: Diagnosis not present

## 2019-02-14 DIAGNOSIS — Z992 Dependence on renal dialysis: Secondary | ICD-10-CM | POA: Diagnosis not present

## 2019-02-14 DIAGNOSIS — N2581 Secondary hyperparathyroidism of renal origin: Secondary | ICD-10-CM | POA: Diagnosis not present

## 2019-02-14 DIAGNOSIS — N186 End stage renal disease: Secondary | ICD-10-CM | POA: Diagnosis not present

## 2019-02-16 DIAGNOSIS — N186 End stage renal disease: Secondary | ICD-10-CM | POA: Diagnosis not present

## 2019-02-16 DIAGNOSIS — Z992 Dependence on renal dialysis: Secondary | ICD-10-CM | POA: Diagnosis not present

## 2019-02-16 DIAGNOSIS — N2581 Secondary hyperparathyroidism of renal origin: Secondary | ICD-10-CM | POA: Diagnosis not present

## 2019-02-18 DIAGNOSIS — N2581 Secondary hyperparathyroidism of renal origin: Secondary | ICD-10-CM | POA: Diagnosis not present

## 2019-02-18 DIAGNOSIS — N186 End stage renal disease: Secondary | ICD-10-CM | POA: Diagnosis not present

## 2019-02-18 DIAGNOSIS — Z992 Dependence on renal dialysis: Secondary | ICD-10-CM | POA: Diagnosis not present

## 2019-02-21 DIAGNOSIS — Z992 Dependence on renal dialysis: Secondary | ICD-10-CM | POA: Diagnosis not present

## 2019-02-21 DIAGNOSIS — N186 End stage renal disease: Secondary | ICD-10-CM | POA: Diagnosis not present

## 2019-02-21 DIAGNOSIS — N2581 Secondary hyperparathyroidism of renal origin: Secondary | ICD-10-CM | POA: Diagnosis not present

## 2019-02-23 DIAGNOSIS — Z992 Dependence on renal dialysis: Secondary | ICD-10-CM | POA: Diagnosis not present

## 2019-02-23 DIAGNOSIS — N2581 Secondary hyperparathyroidism of renal origin: Secondary | ICD-10-CM | POA: Diagnosis not present

## 2019-02-23 DIAGNOSIS — N186 End stage renal disease: Secondary | ICD-10-CM | POA: Diagnosis not present

## 2019-02-25 DIAGNOSIS — N186 End stage renal disease: Secondary | ICD-10-CM | POA: Diagnosis not present

## 2019-02-25 DIAGNOSIS — Z992 Dependence on renal dialysis: Secondary | ICD-10-CM | POA: Diagnosis not present

## 2019-02-25 DIAGNOSIS — N2581 Secondary hyperparathyroidism of renal origin: Secondary | ICD-10-CM | POA: Diagnosis not present

## 2019-02-26 DIAGNOSIS — I129 Hypertensive chronic kidney disease with stage 1 through stage 4 chronic kidney disease, or unspecified chronic kidney disease: Secondary | ICD-10-CM | POA: Diagnosis not present

## 2019-02-26 DIAGNOSIS — N186 End stage renal disease: Secondary | ICD-10-CM | POA: Diagnosis not present

## 2019-02-26 DIAGNOSIS — Z992 Dependence on renal dialysis: Secondary | ICD-10-CM | POA: Diagnosis not present

## 2019-02-28 DIAGNOSIS — Z992 Dependence on renal dialysis: Secondary | ICD-10-CM | POA: Diagnosis not present

## 2019-02-28 DIAGNOSIS — N2581 Secondary hyperparathyroidism of renal origin: Secondary | ICD-10-CM | POA: Diagnosis not present

## 2019-02-28 DIAGNOSIS — N186 End stage renal disease: Secondary | ICD-10-CM | POA: Diagnosis not present

## 2019-03-02 DIAGNOSIS — Z992 Dependence on renal dialysis: Secondary | ICD-10-CM | POA: Diagnosis not present

## 2019-03-02 DIAGNOSIS — N2581 Secondary hyperparathyroidism of renal origin: Secondary | ICD-10-CM | POA: Diagnosis not present

## 2019-03-02 DIAGNOSIS — N186 End stage renal disease: Secondary | ICD-10-CM | POA: Diagnosis not present

## 2019-03-04 DIAGNOSIS — N2581 Secondary hyperparathyroidism of renal origin: Secondary | ICD-10-CM | POA: Diagnosis not present

## 2019-03-04 DIAGNOSIS — Z992 Dependence on renal dialysis: Secondary | ICD-10-CM | POA: Diagnosis not present

## 2019-03-04 DIAGNOSIS — N186 End stage renal disease: Secondary | ICD-10-CM | POA: Diagnosis not present

## 2019-03-07 DIAGNOSIS — N2581 Secondary hyperparathyroidism of renal origin: Secondary | ICD-10-CM | POA: Diagnosis not present

## 2019-03-07 DIAGNOSIS — Z992 Dependence on renal dialysis: Secondary | ICD-10-CM | POA: Diagnosis not present

## 2019-03-07 DIAGNOSIS — N186 End stage renal disease: Secondary | ICD-10-CM | POA: Diagnosis not present

## 2019-03-09 DIAGNOSIS — N186 End stage renal disease: Secondary | ICD-10-CM | POA: Diagnosis not present

## 2019-03-09 DIAGNOSIS — N2581 Secondary hyperparathyroidism of renal origin: Secondary | ICD-10-CM | POA: Diagnosis not present

## 2019-03-09 DIAGNOSIS — Z992 Dependence on renal dialysis: Secondary | ICD-10-CM | POA: Diagnosis not present

## 2019-03-11 DIAGNOSIS — N186 End stage renal disease: Secondary | ICD-10-CM | POA: Diagnosis not present

## 2019-03-11 DIAGNOSIS — N2581 Secondary hyperparathyroidism of renal origin: Secondary | ICD-10-CM | POA: Diagnosis not present

## 2019-03-11 DIAGNOSIS — Z992 Dependence on renal dialysis: Secondary | ICD-10-CM | POA: Diagnosis not present

## 2019-03-14 DIAGNOSIS — N2581 Secondary hyperparathyroidism of renal origin: Secondary | ICD-10-CM | POA: Diagnosis not present

## 2019-03-14 DIAGNOSIS — N186 End stage renal disease: Secondary | ICD-10-CM | POA: Diagnosis not present

## 2019-03-14 DIAGNOSIS — Z992 Dependence on renal dialysis: Secondary | ICD-10-CM | POA: Diagnosis not present

## 2019-03-16 DIAGNOSIS — N186 End stage renal disease: Secondary | ICD-10-CM | POA: Diagnosis not present

## 2019-03-16 DIAGNOSIS — N2581 Secondary hyperparathyroidism of renal origin: Secondary | ICD-10-CM | POA: Diagnosis not present

## 2019-03-16 DIAGNOSIS — Z992 Dependence on renal dialysis: Secondary | ICD-10-CM | POA: Diagnosis not present

## 2019-03-18 DIAGNOSIS — Z992 Dependence on renal dialysis: Secondary | ICD-10-CM | POA: Diagnosis not present

## 2019-03-18 DIAGNOSIS — N2581 Secondary hyperparathyroidism of renal origin: Secondary | ICD-10-CM | POA: Diagnosis not present

## 2019-03-18 DIAGNOSIS — N186 End stage renal disease: Secondary | ICD-10-CM | POA: Diagnosis not present

## 2019-03-21 DIAGNOSIS — N2581 Secondary hyperparathyroidism of renal origin: Secondary | ICD-10-CM | POA: Diagnosis not present

## 2019-03-21 DIAGNOSIS — Z992 Dependence on renal dialysis: Secondary | ICD-10-CM | POA: Diagnosis not present

## 2019-03-21 DIAGNOSIS — N186 End stage renal disease: Secondary | ICD-10-CM | POA: Diagnosis not present

## 2019-03-23 DIAGNOSIS — Z992 Dependence on renal dialysis: Secondary | ICD-10-CM | POA: Diagnosis not present

## 2019-03-23 DIAGNOSIS — N186 End stage renal disease: Secondary | ICD-10-CM | POA: Diagnosis not present

## 2019-03-23 DIAGNOSIS — N2581 Secondary hyperparathyroidism of renal origin: Secondary | ICD-10-CM | POA: Diagnosis not present

## 2019-03-25 DIAGNOSIS — N2581 Secondary hyperparathyroidism of renal origin: Secondary | ICD-10-CM | POA: Diagnosis not present

## 2019-03-25 DIAGNOSIS — Z992 Dependence on renal dialysis: Secondary | ICD-10-CM | POA: Diagnosis not present

## 2019-03-25 DIAGNOSIS — N186 End stage renal disease: Secondary | ICD-10-CM | POA: Diagnosis not present

## 2019-03-28 DIAGNOSIS — Z992 Dependence on renal dialysis: Secondary | ICD-10-CM | POA: Diagnosis not present

## 2019-03-28 DIAGNOSIS — I129 Hypertensive chronic kidney disease with stage 1 through stage 4 chronic kidney disease, or unspecified chronic kidney disease: Secondary | ICD-10-CM | POA: Diagnosis not present

## 2019-03-28 DIAGNOSIS — N2581 Secondary hyperparathyroidism of renal origin: Secondary | ICD-10-CM | POA: Diagnosis not present

## 2019-03-28 DIAGNOSIS — N186 End stage renal disease: Secondary | ICD-10-CM | POA: Diagnosis not present

## 2019-03-30 DIAGNOSIS — Z992 Dependence on renal dialysis: Secondary | ICD-10-CM | POA: Diagnosis not present

## 2019-03-30 DIAGNOSIS — N2581 Secondary hyperparathyroidism of renal origin: Secondary | ICD-10-CM | POA: Diagnosis not present

## 2019-03-30 DIAGNOSIS — N186 End stage renal disease: Secondary | ICD-10-CM | POA: Diagnosis not present

## 2019-04-01 DIAGNOSIS — N186 End stage renal disease: Secondary | ICD-10-CM | POA: Diagnosis not present

## 2019-04-01 DIAGNOSIS — Z992 Dependence on renal dialysis: Secondary | ICD-10-CM | POA: Diagnosis not present

## 2019-04-01 DIAGNOSIS — N2581 Secondary hyperparathyroidism of renal origin: Secondary | ICD-10-CM | POA: Diagnosis not present

## 2019-04-04 DIAGNOSIS — N2581 Secondary hyperparathyroidism of renal origin: Secondary | ICD-10-CM | POA: Diagnosis not present

## 2019-04-04 DIAGNOSIS — Z992 Dependence on renal dialysis: Secondary | ICD-10-CM | POA: Diagnosis not present

## 2019-04-04 DIAGNOSIS — N186 End stage renal disease: Secondary | ICD-10-CM | POA: Diagnosis not present

## 2019-04-06 DIAGNOSIS — N186 End stage renal disease: Secondary | ICD-10-CM | POA: Diagnosis not present

## 2019-04-06 DIAGNOSIS — Z992 Dependence on renal dialysis: Secondary | ICD-10-CM | POA: Diagnosis not present

## 2019-04-06 DIAGNOSIS — N2581 Secondary hyperparathyroidism of renal origin: Secondary | ICD-10-CM | POA: Diagnosis not present

## 2019-04-08 DIAGNOSIS — Z992 Dependence on renal dialysis: Secondary | ICD-10-CM | POA: Diagnosis not present

## 2019-04-08 DIAGNOSIS — N2581 Secondary hyperparathyroidism of renal origin: Secondary | ICD-10-CM | POA: Diagnosis not present

## 2019-04-08 DIAGNOSIS — N186 End stage renal disease: Secondary | ICD-10-CM | POA: Diagnosis not present

## 2019-04-11 DIAGNOSIS — Z992 Dependence on renal dialysis: Secondary | ICD-10-CM | POA: Diagnosis not present

## 2019-04-11 DIAGNOSIS — N2581 Secondary hyperparathyroidism of renal origin: Secondary | ICD-10-CM | POA: Diagnosis not present

## 2019-04-11 DIAGNOSIS — N186 End stage renal disease: Secondary | ICD-10-CM | POA: Diagnosis not present

## 2019-04-13 DIAGNOSIS — Z992 Dependence on renal dialysis: Secondary | ICD-10-CM | POA: Diagnosis not present

## 2019-04-13 DIAGNOSIS — N186 End stage renal disease: Secondary | ICD-10-CM | POA: Diagnosis not present

## 2019-04-13 DIAGNOSIS — N2581 Secondary hyperparathyroidism of renal origin: Secondary | ICD-10-CM | POA: Diagnosis not present

## 2019-04-15 DIAGNOSIS — Z992 Dependence on renal dialysis: Secondary | ICD-10-CM | POA: Diagnosis not present

## 2019-04-15 DIAGNOSIS — N186 End stage renal disease: Secondary | ICD-10-CM | POA: Diagnosis not present

## 2019-04-15 DIAGNOSIS — N2581 Secondary hyperparathyroidism of renal origin: Secondary | ICD-10-CM | POA: Diagnosis not present

## 2019-04-18 DIAGNOSIS — N186 End stage renal disease: Secondary | ICD-10-CM | POA: Diagnosis not present

## 2019-04-18 DIAGNOSIS — Z992 Dependence on renal dialysis: Secondary | ICD-10-CM | POA: Diagnosis not present

## 2019-04-18 DIAGNOSIS — N2581 Secondary hyperparathyroidism of renal origin: Secondary | ICD-10-CM | POA: Diagnosis not present

## 2019-04-20 DIAGNOSIS — N186 End stage renal disease: Secondary | ICD-10-CM | POA: Diagnosis not present

## 2019-04-20 DIAGNOSIS — Z992 Dependence on renal dialysis: Secondary | ICD-10-CM | POA: Diagnosis not present

## 2019-04-20 DIAGNOSIS — N2581 Secondary hyperparathyroidism of renal origin: Secondary | ICD-10-CM | POA: Diagnosis not present

## 2019-04-23 DIAGNOSIS — N186 End stage renal disease: Secondary | ICD-10-CM | POA: Diagnosis not present

## 2019-04-23 DIAGNOSIS — N2581 Secondary hyperparathyroidism of renal origin: Secondary | ICD-10-CM | POA: Diagnosis not present

## 2019-04-23 DIAGNOSIS — Z992 Dependence on renal dialysis: Secondary | ICD-10-CM | POA: Diagnosis not present

## 2019-04-25 DIAGNOSIS — N2581 Secondary hyperparathyroidism of renal origin: Secondary | ICD-10-CM | POA: Diagnosis not present

## 2019-04-25 DIAGNOSIS — N186 End stage renal disease: Secondary | ICD-10-CM | POA: Diagnosis not present

## 2019-04-25 DIAGNOSIS — Z992 Dependence on renal dialysis: Secondary | ICD-10-CM | POA: Diagnosis not present

## 2019-04-27 DIAGNOSIS — N2581 Secondary hyperparathyroidism of renal origin: Secondary | ICD-10-CM | POA: Diagnosis not present

## 2019-04-27 DIAGNOSIS — N186 End stage renal disease: Secondary | ICD-10-CM | POA: Diagnosis not present

## 2019-04-27 DIAGNOSIS — Z992 Dependence on renal dialysis: Secondary | ICD-10-CM | POA: Diagnosis not present

## 2019-04-28 DIAGNOSIS — N186 End stage renal disease: Secondary | ICD-10-CM | POA: Diagnosis not present

## 2019-04-28 DIAGNOSIS — Z992 Dependence on renal dialysis: Secondary | ICD-10-CM | POA: Diagnosis not present

## 2019-04-28 DIAGNOSIS — I129 Hypertensive chronic kidney disease with stage 1 through stage 4 chronic kidney disease, or unspecified chronic kidney disease: Secondary | ICD-10-CM | POA: Diagnosis not present

## 2019-04-30 DIAGNOSIS — Z992 Dependence on renal dialysis: Secondary | ICD-10-CM | POA: Diagnosis not present

## 2019-04-30 DIAGNOSIS — N186 End stage renal disease: Secondary | ICD-10-CM | POA: Diagnosis not present

## 2019-04-30 DIAGNOSIS — N2581 Secondary hyperparathyroidism of renal origin: Secondary | ICD-10-CM | POA: Diagnosis not present

## 2019-05-02 DIAGNOSIS — Z992 Dependence on renal dialysis: Secondary | ICD-10-CM | POA: Diagnosis not present

## 2019-05-02 DIAGNOSIS — N2581 Secondary hyperparathyroidism of renal origin: Secondary | ICD-10-CM | POA: Diagnosis not present

## 2019-05-02 DIAGNOSIS — N186 End stage renal disease: Secondary | ICD-10-CM | POA: Diagnosis not present

## 2019-05-04 DIAGNOSIS — N186 End stage renal disease: Secondary | ICD-10-CM | POA: Diagnosis not present

## 2019-05-04 DIAGNOSIS — N2581 Secondary hyperparathyroidism of renal origin: Secondary | ICD-10-CM | POA: Diagnosis not present

## 2019-05-04 DIAGNOSIS — Z992 Dependence on renal dialysis: Secondary | ICD-10-CM | POA: Diagnosis not present

## 2019-05-06 DIAGNOSIS — Z992 Dependence on renal dialysis: Secondary | ICD-10-CM | POA: Diagnosis not present

## 2019-05-06 DIAGNOSIS — N2581 Secondary hyperparathyroidism of renal origin: Secondary | ICD-10-CM | POA: Diagnosis not present

## 2019-05-06 DIAGNOSIS — N186 End stage renal disease: Secondary | ICD-10-CM | POA: Diagnosis not present

## 2019-05-09 DIAGNOSIS — Z992 Dependence on renal dialysis: Secondary | ICD-10-CM | POA: Diagnosis not present

## 2019-05-09 DIAGNOSIS — N2581 Secondary hyperparathyroidism of renal origin: Secondary | ICD-10-CM | POA: Diagnosis not present

## 2019-05-09 DIAGNOSIS — N186 End stage renal disease: Secondary | ICD-10-CM | POA: Diagnosis not present

## 2019-05-11 DIAGNOSIS — N186 End stage renal disease: Secondary | ICD-10-CM | POA: Diagnosis not present

## 2019-05-11 DIAGNOSIS — N2581 Secondary hyperparathyroidism of renal origin: Secondary | ICD-10-CM | POA: Diagnosis not present

## 2019-05-11 DIAGNOSIS — Z992 Dependence on renal dialysis: Secondary | ICD-10-CM | POA: Diagnosis not present

## 2019-05-12 DIAGNOSIS — H26493 Other secondary cataract, bilateral: Secondary | ICD-10-CM | POA: Diagnosis not present

## 2019-05-13 DIAGNOSIS — N186 End stage renal disease: Secondary | ICD-10-CM | POA: Diagnosis not present

## 2019-05-13 DIAGNOSIS — N2581 Secondary hyperparathyroidism of renal origin: Secondary | ICD-10-CM | POA: Diagnosis not present

## 2019-05-13 DIAGNOSIS — Z992 Dependence on renal dialysis: Secondary | ICD-10-CM | POA: Diagnosis not present

## 2019-05-16 DIAGNOSIS — N2581 Secondary hyperparathyroidism of renal origin: Secondary | ICD-10-CM | POA: Diagnosis not present

## 2019-05-16 DIAGNOSIS — N186 End stage renal disease: Secondary | ICD-10-CM | POA: Diagnosis not present

## 2019-05-16 DIAGNOSIS — Z992 Dependence on renal dialysis: Secondary | ICD-10-CM | POA: Diagnosis not present

## 2019-05-18 DIAGNOSIS — N2581 Secondary hyperparathyroidism of renal origin: Secondary | ICD-10-CM | POA: Diagnosis not present

## 2019-05-18 DIAGNOSIS — Z992 Dependence on renal dialysis: Secondary | ICD-10-CM | POA: Diagnosis not present

## 2019-05-18 DIAGNOSIS — N186 End stage renal disease: Secondary | ICD-10-CM | POA: Diagnosis not present

## 2019-05-20 DIAGNOSIS — Z992 Dependence on renal dialysis: Secondary | ICD-10-CM | POA: Diagnosis not present

## 2019-05-20 DIAGNOSIS — N2581 Secondary hyperparathyroidism of renal origin: Secondary | ICD-10-CM | POA: Diagnosis not present

## 2019-05-20 DIAGNOSIS — N186 End stage renal disease: Secondary | ICD-10-CM | POA: Diagnosis not present

## 2019-05-23 DIAGNOSIS — Z992 Dependence on renal dialysis: Secondary | ICD-10-CM | POA: Diagnosis not present

## 2019-05-23 DIAGNOSIS — N186 End stage renal disease: Secondary | ICD-10-CM | POA: Diagnosis not present

## 2019-05-23 DIAGNOSIS — N2581 Secondary hyperparathyroidism of renal origin: Secondary | ICD-10-CM | POA: Diagnosis not present

## 2019-05-25 DIAGNOSIS — N2581 Secondary hyperparathyroidism of renal origin: Secondary | ICD-10-CM | POA: Diagnosis not present

## 2019-05-25 DIAGNOSIS — Z992 Dependence on renal dialysis: Secondary | ICD-10-CM | POA: Diagnosis not present

## 2019-05-25 DIAGNOSIS — N186 End stage renal disease: Secondary | ICD-10-CM | POA: Diagnosis not present

## 2019-05-27 DIAGNOSIS — N186 End stage renal disease: Secondary | ICD-10-CM | POA: Diagnosis not present

## 2019-05-27 DIAGNOSIS — N2581 Secondary hyperparathyroidism of renal origin: Secondary | ICD-10-CM | POA: Diagnosis not present

## 2019-05-27 DIAGNOSIS — Z992 Dependence on renal dialysis: Secondary | ICD-10-CM | POA: Diagnosis not present

## 2019-05-29 DIAGNOSIS — I129 Hypertensive chronic kidney disease with stage 1 through stage 4 chronic kidney disease, or unspecified chronic kidney disease: Secondary | ICD-10-CM | POA: Diagnosis not present

## 2019-05-29 DIAGNOSIS — Z992 Dependence on renal dialysis: Secondary | ICD-10-CM | POA: Diagnosis not present

## 2019-05-29 DIAGNOSIS — N186 End stage renal disease: Secondary | ICD-10-CM | POA: Diagnosis not present

## 2019-05-30 DIAGNOSIS — Z992 Dependence on renal dialysis: Secondary | ICD-10-CM | POA: Diagnosis not present

## 2019-05-30 DIAGNOSIS — N186 End stage renal disease: Secondary | ICD-10-CM | POA: Diagnosis not present

## 2019-05-30 DIAGNOSIS — N2581 Secondary hyperparathyroidism of renal origin: Secondary | ICD-10-CM | POA: Diagnosis not present

## 2019-06-01 DIAGNOSIS — N2581 Secondary hyperparathyroidism of renal origin: Secondary | ICD-10-CM | POA: Diagnosis not present

## 2019-06-01 DIAGNOSIS — N186 End stage renal disease: Secondary | ICD-10-CM | POA: Diagnosis not present

## 2019-06-01 DIAGNOSIS — Z992 Dependence on renal dialysis: Secondary | ICD-10-CM | POA: Diagnosis not present

## 2019-06-03 DIAGNOSIS — N2581 Secondary hyperparathyroidism of renal origin: Secondary | ICD-10-CM | POA: Diagnosis not present

## 2019-06-03 DIAGNOSIS — N186 End stage renal disease: Secondary | ICD-10-CM | POA: Diagnosis not present

## 2019-06-03 DIAGNOSIS — Z992 Dependence on renal dialysis: Secondary | ICD-10-CM | POA: Diagnosis not present

## 2019-06-06 DIAGNOSIS — Z992 Dependence on renal dialysis: Secondary | ICD-10-CM | POA: Diagnosis not present

## 2019-06-06 DIAGNOSIS — N2581 Secondary hyperparathyroidism of renal origin: Secondary | ICD-10-CM | POA: Diagnosis not present

## 2019-06-06 DIAGNOSIS — N186 End stage renal disease: Secondary | ICD-10-CM | POA: Diagnosis not present

## 2019-06-08 DIAGNOSIS — N2581 Secondary hyperparathyroidism of renal origin: Secondary | ICD-10-CM | POA: Diagnosis not present

## 2019-06-08 DIAGNOSIS — N186 End stage renal disease: Secondary | ICD-10-CM | POA: Diagnosis not present

## 2019-06-08 DIAGNOSIS — Z992 Dependence on renal dialysis: Secondary | ICD-10-CM | POA: Diagnosis not present

## 2019-06-10 DIAGNOSIS — Z992 Dependence on renal dialysis: Secondary | ICD-10-CM | POA: Diagnosis not present

## 2019-06-10 DIAGNOSIS — N186 End stage renal disease: Secondary | ICD-10-CM | POA: Diagnosis not present

## 2019-06-10 DIAGNOSIS — N2581 Secondary hyperparathyroidism of renal origin: Secondary | ICD-10-CM | POA: Diagnosis not present

## 2019-06-13 DIAGNOSIS — N186 End stage renal disease: Secondary | ICD-10-CM | POA: Diagnosis not present

## 2019-06-13 DIAGNOSIS — Z992 Dependence on renal dialysis: Secondary | ICD-10-CM | POA: Diagnosis not present

## 2019-06-13 DIAGNOSIS — N2581 Secondary hyperparathyroidism of renal origin: Secondary | ICD-10-CM | POA: Diagnosis not present

## 2019-06-14 DIAGNOSIS — H26493 Other secondary cataract, bilateral: Secondary | ICD-10-CM | POA: Diagnosis not present

## 2019-06-15 DIAGNOSIS — N2581 Secondary hyperparathyroidism of renal origin: Secondary | ICD-10-CM | POA: Diagnosis not present

## 2019-06-15 DIAGNOSIS — N186 End stage renal disease: Secondary | ICD-10-CM | POA: Diagnosis not present

## 2019-06-15 DIAGNOSIS — Z992 Dependence on renal dialysis: Secondary | ICD-10-CM | POA: Diagnosis not present

## 2019-06-17 DIAGNOSIS — N2581 Secondary hyperparathyroidism of renal origin: Secondary | ICD-10-CM | POA: Diagnosis not present

## 2019-06-17 DIAGNOSIS — Z992 Dependence on renal dialysis: Secondary | ICD-10-CM | POA: Diagnosis not present

## 2019-06-17 DIAGNOSIS — N186 End stage renal disease: Secondary | ICD-10-CM | POA: Diagnosis not present

## 2019-06-20 DIAGNOSIS — N186 End stage renal disease: Secondary | ICD-10-CM | POA: Diagnosis not present

## 2019-06-20 DIAGNOSIS — Z992 Dependence on renal dialysis: Secondary | ICD-10-CM | POA: Diagnosis not present

## 2019-06-20 DIAGNOSIS — N2581 Secondary hyperparathyroidism of renal origin: Secondary | ICD-10-CM | POA: Diagnosis not present

## 2019-06-22 DIAGNOSIS — N2581 Secondary hyperparathyroidism of renal origin: Secondary | ICD-10-CM | POA: Diagnosis not present

## 2019-06-22 DIAGNOSIS — N186 End stage renal disease: Secondary | ICD-10-CM | POA: Diagnosis not present

## 2019-06-22 DIAGNOSIS — Z992 Dependence on renal dialysis: Secondary | ICD-10-CM | POA: Diagnosis not present

## 2019-06-24 DIAGNOSIS — N186 End stage renal disease: Secondary | ICD-10-CM | POA: Diagnosis not present

## 2019-06-24 DIAGNOSIS — Z992 Dependence on renal dialysis: Secondary | ICD-10-CM | POA: Diagnosis not present

## 2019-06-24 DIAGNOSIS — N2581 Secondary hyperparathyroidism of renal origin: Secondary | ICD-10-CM | POA: Diagnosis not present

## 2019-06-26 DIAGNOSIS — N186 End stage renal disease: Secondary | ICD-10-CM | POA: Diagnosis not present

## 2019-06-26 DIAGNOSIS — Z992 Dependence on renal dialysis: Secondary | ICD-10-CM | POA: Diagnosis not present

## 2019-06-26 DIAGNOSIS — I129 Hypertensive chronic kidney disease with stage 1 through stage 4 chronic kidney disease, or unspecified chronic kidney disease: Secondary | ICD-10-CM | POA: Diagnosis not present

## 2019-06-27 DIAGNOSIS — Z992 Dependence on renal dialysis: Secondary | ICD-10-CM | POA: Diagnosis not present

## 2019-06-27 DIAGNOSIS — N186 End stage renal disease: Secondary | ICD-10-CM | POA: Diagnosis not present

## 2019-06-27 DIAGNOSIS — N2581 Secondary hyperparathyroidism of renal origin: Secondary | ICD-10-CM | POA: Diagnosis not present

## 2019-06-28 DIAGNOSIS — E113593 Type 2 diabetes mellitus with proliferative diabetic retinopathy without macular edema, bilateral: Secondary | ICD-10-CM | POA: Diagnosis not present

## 2019-06-28 DIAGNOSIS — H26492 Other secondary cataract, left eye: Secondary | ICD-10-CM | POA: Diagnosis not present

## 2019-06-29 DIAGNOSIS — N2581 Secondary hyperparathyroidism of renal origin: Secondary | ICD-10-CM | POA: Diagnosis not present

## 2019-06-29 DIAGNOSIS — N186 End stage renal disease: Secondary | ICD-10-CM | POA: Diagnosis not present

## 2019-06-29 DIAGNOSIS — Z992 Dependence on renal dialysis: Secondary | ICD-10-CM | POA: Diagnosis not present

## 2019-07-01 DIAGNOSIS — N2581 Secondary hyperparathyroidism of renal origin: Secondary | ICD-10-CM | POA: Diagnosis not present

## 2019-07-01 DIAGNOSIS — N186 End stage renal disease: Secondary | ICD-10-CM | POA: Diagnosis not present

## 2019-07-01 DIAGNOSIS — Z992 Dependence on renal dialysis: Secondary | ICD-10-CM | POA: Diagnosis not present

## 2019-07-04 DIAGNOSIS — N2581 Secondary hyperparathyroidism of renal origin: Secondary | ICD-10-CM | POA: Diagnosis not present

## 2019-07-04 DIAGNOSIS — Z992 Dependence on renal dialysis: Secondary | ICD-10-CM | POA: Diagnosis not present

## 2019-07-04 DIAGNOSIS — N186 End stage renal disease: Secondary | ICD-10-CM | POA: Diagnosis not present

## 2019-07-06 DIAGNOSIS — N2581 Secondary hyperparathyroidism of renal origin: Secondary | ICD-10-CM | POA: Diagnosis not present

## 2019-07-06 DIAGNOSIS — Z992 Dependence on renal dialysis: Secondary | ICD-10-CM | POA: Diagnosis not present

## 2019-07-06 DIAGNOSIS — N186 End stage renal disease: Secondary | ICD-10-CM | POA: Diagnosis not present

## 2019-07-08 DIAGNOSIS — N186 End stage renal disease: Secondary | ICD-10-CM | POA: Diagnosis not present

## 2019-07-08 DIAGNOSIS — Z992 Dependence on renal dialysis: Secondary | ICD-10-CM | POA: Diagnosis not present

## 2019-07-08 DIAGNOSIS — Z23 Encounter for immunization: Secondary | ICD-10-CM | POA: Diagnosis not present

## 2019-07-08 DIAGNOSIS — N2581 Secondary hyperparathyroidism of renal origin: Secondary | ICD-10-CM | POA: Diagnosis not present

## 2019-07-11 DIAGNOSIS — N186 End stage renal disease: Secondary | ICD-10-CM | POA: Diagnosis not present

## 2019-07-11 DIAGNOSIS — Z992 Dependence on renal dialysis: Secondary | ICD-10-CM | POA: Diagnosis not present

## 2019-07-11 DIAGNOSIS — N2581 Secondary hyperparathyroidism of renal origin: Secondary | ICD-10-CM | POA: Diagnosis not present

## 2019-07-13 DIAGNOSIS — Z992 Dependence on renal dialysis: Secondary | ICD-10-CM | POA: Diagnosis not present

## 2019-07-13 DIAGNOSIS — N186 End stage renal disease: Secondary | ICD-10-CM | POA: Diagnosis not present

## 2019-07-13 DIAGNOSIS — N2581 Secondary hyperparathyroidism of renal origin: Secondary | ICD-10-CM | POA: Diagnosis not present

## 2019-07-15 DIAGNOSIS — N186 End stage renal disease: Secondary | ICD-10-CM | POA: Diagnosis not present

## 2019-07-15 DIAGNOSIS — Z992 Dependence on renal dialysis: Secondary | ICD-10-CM | POA: Diagnosis not present

## 2019-07-15 DIAGNOSIS — N2581 Secondary hyperparathyroidism of renal origin: Secondary | ICD-10-CM | POA: Diagnosis not present

## 2019-07-18 DIAGNOSIS — N2581 Secondary hyperparathyroidism of renal origin: Secondary | ICD-10-CM | POA: Diagnosis not present

## 2019-07-18 DIAGNOSIS — N186 End stage renal disease: Secondary | ICD-10-CM | POA: Diagnosis not present

## 2019-07-18 DIAGNOSIS — Z992 Dependence on renal dialysis: Secondary | ICD-10-CM | POA: Diagnosis not present

## 2019-07-20 DIAGNOSIS — Z992 Dependence on renal dialysis: Secondary | ICD-10-CM | POA: Diagnosis not present

## 2019-07-20 DIAGNOSIS — N186 End stage renal disease: Secondary | ICD-10-CM | POA: Diagnosis not present

## 2019-07-20 DIAGNOSIS — N2581 Secondary hyperparathyroidism of renal origin: Secondary | ICD-10-CM | POA: Diagnosis not present

## 2019-07-22 DIAGNOSIS — Z992 Dependence on renal dialysis: Secondary | ICD-10-CM | POA: Diagnosis not present

## 2019-07-22 DIAGNOSIS — N2581 Secondary hyperparathyroidism of renal origin: Secondary | ICD-10-CM | POA: Diagnosis not present

## 2019-07-22 DIAGNOSIS — N186 End stage renal disease: Secondary | ICD-10-CM | POA: Diagnosis not present

## 2019-07-25 DIAGNOSIS — N186 End stage renal disease: Secondary | ICD-10-CM | POA: Diagnosis not present

## 2019-07-25 DIAGNOSIS — N2581 Secondary hyperparathyroidism of renal origin: Secondary | ICD-10-CM | POA: Diagnosis not present

## 2019-07-25 DIAGNOSIS — Z992 Dependence on renal dialysis: Secondary | ICD-10-CM | POA: Diagnosis not present

## 2019-07-26 DIAGNOSIS — N390 Urinary tract infection, site not specified: Secondary | ICD-10-CM | POA: Diagnosis not present

## 2019-07-26 DIAGNOSIS — N184 Chronic kidney disease, stage 4 (severe): Secondary | ICD-10-CM | POA: Diagnosis not present

## 2019-07-26 DIAGNOSIS — Z1331 Encounter for screening for depression: Secondary | ICD-10-CM | POA: Diagnosis not present

## 2019-07-26 DIAGNOSIS — R7303 Prediabetes: Secondary | ICD-10-CM | POA: Diagnosis not present

## 2019-07-26 DIAGNOSIS — I130 Hypertensive heart and chronic kidney disease with heart failure and stage 1 through stage 4 chronic kidney disease, or unspecified chronic kidney disease: Secondary | ICD-10-CM | POA: Diagnosis not present

## 2019-07-26 DIAGNOSIS — E785 Hyperlipidemia, unspecified: Secondary | ICD-10-CM | POA: Diagnosis not present

## 2019-07-27 DIAGNOSIS — N2581 Secondary hyperparathyroidism of renal origin: Secondary | ICD-10-CM | POA: Diagnosis not present

## 2019-07-27 DIAGNOSIS — I129 Hypertensive chronic kidney disease with stage 1 through stage 4 chronic kidney disease, or unspecified chronic kidney disease: Secondary | ICD-10-CM | POA: Diagnosis not present

## 2019-07-27 DIAGNOSIS — Z992 Dependence on renal dialysis: Secondary | ICD-10-CM | POA: Diagnosis not present

## 2019-07-27 DIAGNOSIS — N186 End stage renal disease: Secondary | ICD-10-CM | POA: Diagnosis not present

## 2019-07-29 DIAGNOSIS — N2581 Secondary hyperparathyroidism of renal origin: Secondary | ICD-10-CM | POA: Diagnosis not present

## 2019-07-29 DIAGNOSIS — N186 End stage renal disease: Secondary | ICD-10-CM | POA: Diagnosis not present

## 2019-07-29 DIAGNOSIS — Z992 Dependence on renal dialysis: Secondary | ICD-10-CM | POA: Diagnosis not present

## 2019-08-01 DIAGNOSIS — N186 End stage renal disease: Secondary | ICD-10-CM | POA: Diagnosis not present

## 2019-08-01 DIAGNOSIS — Z992 Dependence on renal dialysis: Secondary | ICD-10-CM | POA: Diagnosis not present

## 2019-08-01 DIAGNOSIS — N2581 Secondary hyperparathyroidism of renal origin: Secondary | ICD-10-CM | POA: Diagnosis not present

## 2019-08-03 DIAGNOSIS — N186 End stage renal disease: Secondary | ICD-10-CM | POA: Diagnosis not present

## 2019-08-03 DIAGNOSIS — N2581 Secondary hyperparathyroidism of renal origin: Secondary | ICD-10-CM | POA: Diagnosis not present

## 2019-08-03 DIAGNOSIS — Z992 Dependence on renal dialysis: Secondary | ICD-10-CM | POA: Diagnosis not present

## 2019-08-05 DIAGNOSIS — N2581 Secondary hyperparathyroidism of renal origin: Secondary | ICD-10-CM | POA: Diagnosis not present

## 2019-08-05 DIAGNOSIS — N186 End stage renal disease: Secondary | ICD-10-CM | POA: Diagnosis not present

## 2019-08-05 DIAGNOSIS — Z992 Dependence on renal dialysis: Secondary | ICD-10-CM | POA: Diagnosis not present

## 2019-08-05 DIAGNOSIS — Z23 Encounter for immunization: Secondary | ICD-10-CM | POA: Diagnosis not present

## 2019-08-08 DIAGNOSIS — N186 End stage renal disease: Secondary | ICD-10-CM | POA: Diagnosis not present

## 2019-08-08 DIAGNOSIS — N2581 Secondary hyperparathyroidism of renal origin: Secondary | ICD-10-CM | POA: Diagnosis not present

## 2019-08-08 DIAGNOSIS — Z992 Dependence on renal dialysis: Secondary | ICD-10-CM | POA: Diagnosis not present

## 2019-08-10 DIAGNOSIS — N186 End stage renal disease: Secondary | ICD-10-CM | POA: Diagnosis not present

## 2019-08-10 DIAGNOSIS — Z992 Dependence on renal dialysis: Secondary | ICD-10-CM | POA: Diagnosis not present

## 2019-08-10 DIAGNOSIS — N2581 Secondary hyperparathyroidism of renal origin: Secondary | ICD-10-CM | POA: Diagnosis not present

## 2019-08-12 DIAGNOSIS — N186 End stage renal disease: Secondary | ICD-10-CM | POA: Diagnosis not present

## 2019-08-12 DIAGNOSIS — Z992 Dependence on renal dialysis: Secondary | ICD-10-CM | POA: Diagnosis not present

## 2019-08-12 DIAGNOSIS — N2581 Secondary hyperparathyroidism of renal origin: Secondary | ICD-10-CM | POA: Diagnosis not present

## 2019-08-15 DIAGNOSIS — N186 End stage renal disease: Secondary | ICD-10-CM | POA: Diagnosis not present

## 2019-08-15 DIAGNOSIS — Z992 Dependence on renal dialysis: Secondary | ICD-10-CM | POA: Diagnosis not present

## 2019-08-15 DIAGNOSIS — N2581 Secondary hyperparathyroidism of renal origin: Secondary | ICD-10-CM | POA: Diagnosis not present

## 2019-08-17 DIAGNOSIS — N2581 Secondary hyperparathyroidism of renal origin: Secondary | ICD-10-CM | POA: Diagnosis not present

## 2019-08-17 DIAGNOSIS — N186 End stage renal disease: Secondary | ICD-10-CM | POA: Diagnosis not present

## 2019-08-17 DIAGNOSIS — Z992 Dependence on renal dialysis: Secondary | ICD-10-CM | POA: Diagnosis not present

## 2019-08-19 DIAGNOSIS — N186 End stage renal disease: Secondary | ICD-10-CM | POA: Diagnosis not present

## 2019-08-19 DIAGNOSIS — N2581 Secondary hyperparathyroidism of renal origin: Secondary | ICD-10-CM | POA: Diagnosis not present

## 2019-08-19 DIAGNOSIS — Z992 Dependence on renal dialysis: Secondary | ICD-10-CM | POA: Diagnosis not present

## 2019-08-22 DIAGNOSIS — Z992 Dependence on renal dialysis: Secondary | ICD-10-CM | POA: Diagnosis not present

## 2019-08-22 DIAGNOSIS — N186 End stage renal disease: Secondary | ICD-10-CM | POA: Diagnosis not present

## 2019-08-22 DIAGNOSIS — N2581 Secondary hyperparathyroidism of renal origin: Secondary | ICD-10-CM | POA: Diagnosis not present

## 2019-08-24 DIAGNOSIS — N2581 Secondary hyperparathyroidism of renal origin: Secondary | ICD-10-CM | POA: Diagnosis not present

## 2019-08-24 DIAGNOSIS — N186 End stage renal disease: Secondary | ICD-10-CM | POA: Diagnosis not present

## 2019-08-24 DIAGNOSIS — Z992 Dependence on renal dialysis: Secondary | ICD-10-CM | POA: Diagnosis not present

## 2019-08-26 DIAGNOSIS — N2581 Secondary hyperparathyroidism of renal origin: Secondary | ICD-10-CM | POA: Diagnosis not present

## 2019-08-26 DIAGNOSIS — Z992 Dependence on renal dialysis: Secondary | ICD-10-CM | POA: Diagnosis not present

## 2019-08-26 DIAGNOSIS — N186 End stage renal disease: Secondary | ICD-10-CM | POA: Diagnosis not present

## 2019-08-26 DIAGNOSIS — I129 Hypertensive chronic kidney disease with stage 1 through stage 4 chronic kidney disease, or unspecified chronic kidney disease: Secondary | ICD-10-CM | POA: Diagnosis not present

## 2019-08-29 DIAGNOSIS — Z992 Dependence on renal dialysis: Secondary | ICD-10-CM | POA: Diagnosis not present

## 2019-08-29 DIAGNOSIS — N2581 Secondary hyperparathyroidism of renal origin: Secondary | ICD-10-CM | POA: Diagnosis not present

## 2019-08-29 DIAGNOSIS — N186 End stage renal disease: Secondary | ICD-10-CM | POA: Diagnosis not present

## 2019-08-31 DIAGNOSIS — Z992 Dependence on renal dialysis: Secondary | ICD-10-CM | POA: Diagnosis not present

## 2019-08-31 DIAGNOSIS — N2581 Secondary hyperparathyroidism of renal origin: Secondary | ICD-10-CM | POA: Diagnosis not present

## 2019-08-31 DIAGNOSIS — N186 End stage renal disease: Secondary | ICD-10-CM | POA: Diagnosis not present

## 2019-09-02 DIAGNOSIS — N186 End stage renal disease: Secondary | ICD-10-CM | POA: Diagnosis not present

## 2019-09-02 DIAGNOSIS — N2581 Secondary hyperparathyroidism of renal origin: Secondary | ICD-10-CM | POA: Diagnosis not present

## 2019-09-02 DIAGNOSIS — Z992 Dependence on renal dialysis: Secondary | ICD-10-CM | POA: Diagnosis not present

## 2019-09-05 DIAGNOSIS — N2581 Secondary hyperparathyroidism of renal origin: Secondary | ICD-10-CM | POA: Diagnosis not present

## 2019-09-05 DIAGNOSIS — N186 End stage renal disease: Secondary | ICD-10-CM | POA: Diagnosis not present

## 2019-09-05 DIAGNOSIS — Z992 Dependence on renal dialysis: Secondary | ICD-10-CM | POA: Diagnosis not present

## 2019-09-07 DIAGNOSIS — Z992 Dependence on renal dialysis: Secondary | ICD-10-CM | POA: Diagnosis not present

## 2019-09-07 DIAGNOSIS — N186 End stage renal disease: Secondary | ICD-10-CM | POA: Diagnosis not present

## 2019-09-07 DIAGNOSIS — N2581 Secondary hyperparathyroidism of renal origin: Secondary | ICD-10-CM | POA: Diagnosis not present

## 2019-09-08 DIAGNOSIS — H35052 Retinal neovascularization, unspecified, left eye: Secondary | ICD-10-CM | POA: Diagnosis not present

## 2019-09-09 DIAGNOSIS — Z992 Dependence on renal dialysis: Secondary | ICD-10-CM | POA: Diagnosis not present

## 2019-09-09 DIAGNOSIS — N186 End stage renal disease: Secondary | ICD-10-CM | POA: Diagnosis not present

## 2019-09-09 DIAGNOSIS — N2581 Secondary hyperparathyroidism of renal origin: Secondary | ICD-10-CM | POA: Diagnosis not present

## 2019-09-12 DIAGNOSIS — N2581 Secondary hyperparathyroidism of renal origin: Secondary | ICD-10-CM | POA: Diagnosis not present

## 2019-09-12 DIAGNOSIS — N186 End stage renal disease: Secondary | ICD-10-CM | POA: Diagnosis not present

## 2019-09-12 DIAGNOSIS — Z992 Dependence on renal dialysis: Secondary | ICD-10-CM | POA: Diagnosis not present

## 2019-09-14 DIAGNOSIS — N2581 Secondary hyperparathyroidism of renal origin: Secondary | ICD-10-CM | POA: Diagnosis not present

## 2019-09-14 DIAGNOSIS — Z992 Dependence on renal dialysis: Secondary | ICD-10-CM | POA: Diagnosis not present

## 2019-09-14 DIAGNOSIS — N186 End stage renal disease: Secondary | ICD-10-CM | POA: Diagnosis not present

## 2019-09-16 DIAGNOSIS — Z992 Dependence on renal dialysis: Secondary | ICD-10-CM | POA: Diagnosis not present

## 2019-09-16 DIAGNOSIS — N2581 Secondary hyperparathyroidism of renal origin: Secondary | ICD-10-CM | POA: Diagnosis not present

## 2019-09-16 DIAGNOSIS — N186 End stage renal disease: Secondary | ICD-10-CM | POA: Diagnosis not present

## 2019-09-19 DIAGNOSIS — N2581 Secondary hyperparathyroidism of renal origin: Secondary | ICD-10-CM | POA: Diagnosis not present

## 2019-09-19 DIAGNOSIS — Z992 Dependence on renal dialysis: Secondary | ICD-10-CM | POA: Diagnosis not present

## 2019-09-19 DIAGNOSIS — N186 End stage renal disease: Secondary | ICD-10-CM | POA: Diagnosis not present

## 2019-09-21 DIAGNOSIS — N2581 Secondary hyperparathyroidism of renal origin: Secondary | ICD-10-CM | POA: Diagnosis not present

## 2019-09-21 DIAGNOSIS — N186 End stage renal disease: Secondary | ICD-10-CM | POA: Diagnosis not present

## 2019-09-21 DIAGNOSIS — Z992 Dependence on renal dialysis: Secondary | ICD-10-CM | POA: Diagnosis not present

## 2019-09-23 DIAGNOSIS — N2581 Secondary hyperparathyroidism of renal origin: Secondary | ICD-10-CM | POA: Diagnosis not present

## 2019-09-23 DIAGNOSIS — Z992 Dependence on renal dialysis: Secondary | ICD-10-CM | POA: Diagnosis not present

## 2019-09-23 DIAGNOSIS — N186 End stage renal disease: Secondary | ICD-10-CM | POA: Diagnosis not present

## 2019-09-26 DIAGNOSIS — Z992 Dependence on renal dialysis: Secondary | ICD-10-CM | POA: Diagnosis not present

## 2019-09-26 DIAGNOSIS — N2581 Secondary hyperparathyroidism of renal origin: Secondary | ICD-10-CM | POA: Diagnosis not present

## 2019-09-26 DIAGNOSIS — N186 End stage renal disease: Secondary | ICD-10-CM | POA: Diagnosis not present

## 2019-09-26 DIAGNOSIS — I129 Hypertensive chronic kidney disease with stage 1 through stage 4 chronic kidney disease, or unspecified chronic kidney disease: Secondary | ICD-10-CM | POA: Diagnosis not present

## 2019-09-28 DIAGNOSIS — N2581 Secondary hyperparathyroidism of renal origin: Secondary | ICD-10-CM | POA: Diagnosis not present

## 2019-09-28 DIAGNOSIS — Z992 Dependence on renal dialysis: Secondary | ICD-10-CM | POA: Diagnosis not present

## 2019-09-28 DIAGNOSIS — N186 End stage renal disease: Secondary | ICD-10-CM | POA: Diagnosis not present

## 2019-09-30 DIAGNOSIS — N2581 Secondary hyperparathyroidism of renal origin: Secondary | ICD-10-CM | POA: Diagnosis not present

## 2019-09-30 DIAGNOSIS — Z992 Dependence on renal dialysis: Secondary | ICD-10-CM | POA: Diagnosis not present

## 2019-09-30 DIAGNOSIS — N186 End stage renal disease: Secondary | ICD-10-CM | POA: Diagnosis not present

## 2019-10-03 DIAGNOSIS — N2581 Secondary hyperparathyroidism of renal origin: Secondary | ICD-10-CM | POA: Diagnosis not present

## 2019-10-03 DIAGNOSIS — N186 End stage renal disease: Secondary | ICD-10-CM | POA: Diagnosis not present

## 2019-10-03 DIAGNOSIS — Z992 Dependence on renal dialysis: Secondary | ICD-10-CM | POA: Diagnosis not present

## 2019-10-05 DIAGNOSIS — Z992 Dependence on renal dialysis: Secondary | ICD-10-CM | POA: Diagnosis not present

## 2019-10-05 DIAGNOSIS — N186 End stage renal disease: Secondary | ICD-10-CM | POA: Diagnosis not present

## 2019-10-05 DIAGNOSIS — N2581 Secondary hyperparathyroidism of renal origin: Secondary | ICD-10-CM | POA: Diagnosis not present

## 2019-10-07 DIAGNOSIS — N2581 Secondary hyperparathyroidism of renal origin: Secondary | ICD-10-CM | POA: Diagnosis not present

## 2019-10-07 DIAGNOSIS — Z992 Dependence on renal dialysis: Secondary | ICD-10-CM | POA: Diagnosis not present

## 2019-10-07 DIAGNOSIS — N186 End stage renal disease: Secondary | ICD-10-CM | POA: Diagnosis not present

## 2019-10-10 DIAGNOSIS — N186 End stage renal disease: Secondary | ICD-10-CM | POA: Diagnosis not present

## 2019-10-10 DIAGNOSIS — Z992 Dependence on renal dialysis: Secondary | ICD-10-CM | POA: Diagnosis not present

## 2019-10-10 DIAGNOSIS — N2581 Secondary hyperparathyroidism of renal origin: Secondary | ICD-10-CM | POA: Diagnosis not present

## 2019-10-14 DIAGNOSIS — N186 End stage renal disease: Secondary | ICD-10-CM | POA: Diagnosis not present

## 2019-10-14 DIAGNOSIS — N2581 Secondary hyperparathyroidism of renal origin: Secondary | ICD-10-CM | POA: Diagnosis not present

## 2019-10-14 DIAGNOSIS — Z992 Dependence on renal dialysis: Secondary | ICD-10-CM | POA: Diagnosis not present

## 2019-10-17 DIAGNOSIS — N186 End stage renal disease: Secondary | ICD-10-CM | POA: Diagnosis not present

## 2019-10-17 DIAGNOSIS — N2581 Secondary hyperparathyroidism of renal origin: Secondary | ICD-10-CM | POA: Diagnosis not present

## 2019-10-17 DIAGNOSIS — Z992 Dependence on renal dialysis: Secondary | ICD-10-CM | POA: Diagnosis not present

## 2019-10-19 DIAGNOSIS — Z992 Dependence on renal dialysis: Secondary | ICD-10-CM | POA: Diagnosis not present

## 2019-10-19 DIAGNOSIS — H35052 Retinal neovascularization, unspecified, left eye: Secondary | ICD-10-CM | POA: Diagnosis not present

## 2019-10-19 DIAGNOSIS — N2581 Secondary hyperparathyroidism of renal origin: Secondary | ICD-10-CM | POA: Diagnosis not present

## 2019-10-19 DIAGNOSIS — N186 End stage renal disease: Secondary | ICD-10-CM | POA: Diagnosis not present

## 2019-10-21 DIAGNOSIS — N186 End stage renal disease: Secondary | ICD-10-CM | POA: Diagnosis not present

## 2019-10-21 DIAGNOSIS — N2581 Secondary hyperparathyroidism of renal origin: Secondary | ICD-10-CM | POA: Diagnosis not present

## 2019-10-21 DIAGNOSIS — Z992 Dependence on renal dialysis: Secondary | ICD-10-CM | POA: Diagnosis not present

## 2019-10-24 DIAGNOSIS — Z992 Dependence on renal dialysis: Secondary | ICD-10-CM | POA: Diagnosis not present

## 2019-10-24 DIAGNOSIS — N186 End stage renal disease: Secondary | ICD-10-CM | POA: Diagnosis not present

## 2019-10-24 DIAGNOSIS — N2581 Secondary hyperparathyroidism of renal origin: Secondary | ICD-10-CM | POA: Diagnosis not present

## 2019-10-26 DIAGNOSIS — N186 End stage renal disease: Secondary | ICD-10-CM | POA: Diagnosis not present

## 2019-10-26 DIAGNOSIS — I129 Hypertensive chronic kidney disease with stage 1 through stage 4 chronic kidney disease, or unspecified chronic kidney disease: Secondary | ICD-10-CM | POA: Diagnosis not present

## 2019-10-26 DIAGNOSIS — Z992 Dependence on renal dialysis: Secondary | ICD-10-CM | POA: Diagnosis not present

## 2019-10-26 DIAGNOSIS — N2581 Secondary hyperparathyroidism of renal origin: Secondary | ICD-10-CM | POA: Diagnosis not present

## 2019-10-28 DIAGNOSIS — N186 End stage renal disease: Secondary | ICD-10-CM | POA: Diagnosis not present

## 2019-10-28 DIAGNOSIS — Z992 Dependence on renal dialysis: Secondary | ICD-10-CM | POA: Diagnosis not present

## 2019-10-28 DIAGNOSIS — N2581 Secondary hyperparathyroidism of renal origin: Secondary | ICD-10-CM | POA: Diagnosis not present

## 2019-10-31 DIAGNOSIS — Z992 Dependence on renal dialysis: Secondary | ICD-10-CM | POA: Diagnosis not present

## 2019-10-31 DIAGNOSIS — N2581 Secondary hyperparathyroidism of renal origin: Secondary | ICD-10-CM | POA: Diagnosis not present

## 2019-10-31 DIAGNOSIS — N186 End stage renal disease: Secondary | ICD-10-CM | POA: Diagnosis not present

## 2019-11-02 DIAGNOSIS — N2581 Secondary hyperparathyroidism of renal origin: Secondary | ICD-10-CM | POA: Diagnosis not present

## 2019-11-02 DIAGNOSIS — N186 End stage renal disease: Secondary | ICD-10-CM | POA: Diagnosis not present

## 2019-11-02 DIAGNOSIS — Z992 Dependence on renal dialysis: Secondary | ICD-10-CM | POA: Diagnosis not present

## 2019-11-04 DIAGNOSIS — Z992 Dependence on renal dialysis: Secondary | ICD-10-CM | POA: Diagnosis not present

## 2019-11-04 DIAGNOSIS — N186 End stage renal disease: Secondary | ICD-10-CM | POA: Diagnosis not present

## 2019-11-04 DIAGNOSIS — N2581 Secondary hyperparathyroidism of renal origin: Secondary | ICD-10-CM | POA: Diagnosis not present

## 2019-11-07 DIAGNOSIS — N2581 Secondary hyperparathyroidism of renal origin: Secondary | ICD-10-CM | POA: Diagnosis not present

## 2019-11-07 DIAGNOSIS — N186 End stage renal disease: Secondary | ICD-10-CM | POA: Diagnosis not present

## 2019-11-07 DIAGNOSIS — Z992 Dependence on renal dialysis: Secondary | ICD-10-CM | POA: Diagnosis not present

## 2019-11-09 DIAGNOSIS — N2581 Secondary hyperparathyroidism of renal origin: Secondary | ICD-10-CM | POA: Diagnosis not present

## 2019-11-09 DIAGNOSIS — N186 End stage renal disease: Secondary | ICD-10-CM | POA: Diagnosis not present

## 2019-11-09 DIAGNOSIS — Z992 Dependence on renal dialysis: Secondary | ICD-10-CM | POA: Diagnosis not present

## 2019-11-11 DIAGNOSIS — Z992 Dependence on renal dialysis: Secondary | ICD-10-CM | POA: Diagnosis not present

## 2019-11-11 DIAGNOSIS — N186 End stage renal disease: Secondary | ICD-10-CM | POA: Diagnosis not present

## 2019-11-11 DIAGNOSIS — N2581 Secondary hyperparathyroidism of renal origin: Secondary | ICD-10-CM | POA: Diagnosis not present

## 2019-11-14 DIAGNOSIS — Z992 Dependence on renal dialysis: Secondary | ICD-10-CM | POA: Diagnosis not present

## 2019-11-14 DIAGNOSIS — N186 End stage renal disease: Secondary | ICD-10-CM | POA: Diagnosis not present

## 2019-11-14 DIAGNOSIS — N2581 Secondary hyperparathyroidism of renal origin: Secondary | ICD-10-CM | POA: Diagnosis not present

## 2019-11-14 DIAGNOSIS — R197 Diarrhea, unspecified: Secondary | ICD-10-CM | POA: Diagnosis not present

## 2019-11-16 DIAGNOSIS — Z992 Dependence on renal dialysis: Secondary | ICD-10-CM | POA: Diagnosis not present

## 2019-11-16 DIAGNOSIS — R197 Diarrhea, unspecified: Secondary | ICD-10-CM | POA: Diagnosis not present

## 2019-11-16 DIAGNOSIS — N2581 Secondary hyperparathyroidism of renal origin: Secondary | ICD-10-CM | POA: Diagnosis not present

## 2019-11-16 DIAGNOSIS — N186 End stage renal disease: Secondary | ICD-10-CM | POA: Diagnosis not present

## 2019-11-18 DIAGNOSIS — Z992 Dependence on renal dialysis: Secondary | ICD-10-CM | POA: Diagnosis not present

## 2019-11-18 DIAGNOSIS — N2581 Secondary hyperparathyroidism of renal origin: Secondary | ICD-10-CM | POA: Diagnosis not present

## 2019-11-18 DIAGNOSIS — N186 End stage renal disease: Secondary | ICD-10-CM | POA: Diagnosis not present

## 2019-11-18 DIAGNOSIS — R197 Diarrhea, unspecified: Secondary | ICD-10-CM | POA: Diagnosis not present

## 2019-11-21 DIAGNOSIS — N186 End stage renal disease: Secondary | ICD-10-CM | POA: Diagnosis not present

## 2019-11-21 DIAGNOSIS — N2581 Secondary hyperparathyroidism of renal origin: Secondary | ICD-10-CM | POA: Diagnosis not present

## 2019-11-21 DIAGNOSIS — Z992 Dependence on renal dialysis: Secondary | ICD-10-CM | POA: Diagnosis not present

## 2019-11-23 DIAGNOSIS — N2581 Secondary hyperparathyroidism of renal origin: Secondary | ICD-10-CM | POA: Diagnosis not present

## 2019-11-23 DIAGNOSIS — Z992 Dependence on renal dialysis: Secondary | ICD-10-CM | POA: Diagnosis not present

## 2019-11-23 DIAGNOSIS — N186 End stage renal disease: Secondary | ICD-10-CM | POA: Diagnosis not present

## 2019-11-25 DIAGNOSIS — N2581 Secondary hyperparathyroidism of renal origin: Secondary | ICD-10-CM | POA: Diagnosis not present

## 2019-11-25 DIAGNOSIS — N186 End stage renal disease: Secondary | ICD-10-CM | POA: Diagnosis not present

## 2019-11-25 DIAGNOSIS — Z992 Dependence on renal dialysis: Secondary | ICD-10-CM | POA: Diagnosis not present

## 2019-11-26 DIAGNOSIS — I129 Hypertensive chronic kidney disease with stage 1 through stage 4 chronic kidney disease, or unspecified chronic kidney disease: Secondary | ICD-10-CM | POA: Diagnosis not present

## 2019-11-26 DIAGNOSIS — N186 End stage renal disease: Secondary | ICD-10-CM | POA: Diagnosis not present

## 2019-11-26 DIAGNOSIS — Z992 Dependence on renal dialysis: Secondary | ICD-10-CM | POA: Diagnosis not present

## 2019-11-28 DIAGNOSIS — Z992 Dependence on renal dialysis: Secondary | ICD-10-CM | POA: Diagnosis not present

## 2019-11-28 DIAGNOSIS — N2581 Secondary hyperparathyroidism of renal origin: Secondary | ICD-10-CM | POA: Diagnosis not present

## 2019-11-28 DIAGNOSIS — N186 End stage renal disease: Secondary | ICD-10-CM | POA: Diagnosis not present

## 2019-11-30 DIAGNOSIS — N186 End stage renal disease: Secondary | ICD-10-CM | POA: Diagnosis not present

## 2019-11-30 DIAGNOSIS — Z992 Dependence on renal dialysis: Secondary | ICD-10-CM | POA: Diagnosis not present

## 2019-11-30 DIAGNOSIS — N2581 Secondary hyperparathyroidism of renal origin: Secondary | ICD-10-CM | POA: Diagnosis not present

## 2019-12-02 DIAGNOSIS — N2581 Secondary hyperparathyroidism of renal origin: Secondary | ICD-10-CM | POA: Diagnosis not present

## 2019-12-02 DIAGNOSIS — N186 End stage renal disease: Secondary | ICD-10-CM | POA: Diagnosis not present

## 2019-12-02 DIAGNOSIS — Z992 Dependence on renal dialysis: Secondary | ICD-10-CM | POA: Diagnosis not present

## 2019-12-05 DIAGNOSIS — N2581 Secondary hyperparathyroidism of renal origin: Secondary | ICD-10-CM | POA: Diagnosis not present

## 2019-12-05 DIAGNOSIS — Z992 Dependence on renal dialysis: Secondary | ICD-10-CM | POA: Diagnosis not present

## 2019-12-05 DIAGNOSIS — N186 End stage renal disease: Secondary | ICD-10-CM | POA: Diagnosis not present

## 2019-12-06 DIAGNOSIS — E113593 Type 2 diabetes mellitus with proliferative diabetic retinopathy without macular edema, bilateral: Secondary | ICD-10-CM | POA: Diagnosis not present

## 2019-12-07 DIAGNOSIS — N2581 Secondary hyperparathyroidism of renal origin: Secondary | ICD-10-CM | POA: Diagnosis not present

## 2019-12-07 DIAGNOSIS — N186 End stage renal disease: Secondary | ICD-10-CM | POA: Diagnosis not present

## 2019-12-07 DIAGNOSIS — Z992 Dependence on renal dialysis: Secondary | ICD-10-CM | POA: Diagnosis not present

## 2019-12-09 DIAGNOSIS — N186 End stage renal disease: Secondary | ICD-10-CM | POA: Diagnosis not present

## 2019-12-09 DIAGNOSIS — N2581 Secondary hyperparathyroidism of renal origin: Secondary | ICD-10-CM | POA: Diagnosis not present

## 2019-12-09 DIAGNOSIS — Z992 Dependence on renal dialysis: Secondary | ICD-10-CM | POA: Diagnosis not present

## 2019-12-12 DIAGNOSIS — N186 End stage renal disease: Secondary | ICD-10-CM | POA: Diagnosis not present

## 2019-12-12 DIAGNOSIS — N2581 Secondary hyperparathyroidism of renal origin: Secondary | ICD-10-CM | POA: Diagnosis not present

## 2019-12-12 DIAGNOSIS — Z992 Dependence on renal dialysis: Secondary | ICD-10-CM | POA: Diagnosis not present

## 2019-12-14 DIAGNOSIS — N2581 Secondary hyperparathyroidism of renal origin: Secondary | ICD-10-CM | POA: Diagnosis not present

## 2019-12-14 DIAGNOSIS — N186 End stage renal disease: Secondary | ICD-10-CM | POA: Diagnosis not present

## 2019-12-14 DIAGNOSIS — Z992 Dependence on renal dialysis: Secondary | ICD-10-CM | POA: Diagnosis not present

## 2019-12-16 DIAGNOSIS — Z992 Dependence on renal dialysis: Secondary | ICD-10-CM | POA: Diagnosis not present

## 2019-12-16 DIAGNOSIS — N2581 Secondary hyperparathyroidism of renal origin: Secondary | ICD-10-CM | POA: Diagnosis not present

## 2019-12-16 DIAGNOSIS — N186 End stage renal disease: Secondary | ICD-10-CM | POA: Diagnosis not present

## 2019-12-19 DIAGNOSIS — Z992 Dependence on renal dialysis: Secondary | ICD-10-CM | POA: Diagnosis not present

## 2019-12-19 DIAGNOSIS — N2581 Secondary hyperparathyroidism of renal origin: Secondary | ICD-10-CM | POA: Diagnosis not present

## 2019-12-19 DIAGNOSIS — N186 End stage renal disease: Secondary | ICD-10-CM | POA: Diagnosis not present

## 2019-12-21 DIAGNOSIS — Z992 Dependence on renal dialysis: Secondary | ICD-10-CM | POA: Diagnosis not present

## 2019-12-21 DIAGNOSIS — N186 End stage renal disease: Secondary | ICD-10-CM | POA: Diagnosis not present

## 2019-12-21 DIAGNOSIS — N2581 Secondary hyperparathyroidism of renal origin: Secondary | ICD-10-CM | POA: Diagnosis not present

## 2019-12-23 DIAGNOSIS — N2581 Secondary hyperparathyroidism of renal origin: Secondary | ICD-10-CM | POA: Diagnosis not present

## 2019-12-23 DIAGNOSIS — Z992 Dependence on renal dialysis: Secondary | ICD-10-CM | POA: Diagnosis not present

## 2019-12-23 DIAGNOSIS — N186 End stage renal disease: Secondary | ICD-10-CM | POA: Diagnosis not present

## 2019-12-26 DIAGNOSIS — N2581 Secondary hyperparathyroidism of renal origin: Secondary | ICD-10-CM | POA: Diagnosis not present

## 2019-12-26 DIAGNOSIS — Z992 Dependence on renal dialysis: Secondary | ICD-10-CM | POA: Diagnosis not present

## 2019-12-26 DIAGNOSIS — N186 End stage renal disease: Secondary | ICD-10-CM | POA: Diagnosis not present

## 2019-12-27 DIAGNOSIS — I129 Hypertensive chronic kidney disease with stage 1 through stage 4 chronic kidney disease, or unspecified chronic kidney disease: Secondary | ICD-10-CM | POA: Diagnosis not present

## 2019-12-27 DIAGNOSIS — Z992 Dependence on renal dialysis: Secondary | ICD-10-CM | POA: Diagnosis not present

## 2019-12-27 DIAGNOSIS — N186 End stage renal disease: Secondary | ICD-10-CM | POA: Diagnosis not present

## 2019-12-28 DIAGNOSIS — R197 Diarrhea, unspecified: Secondary | ICD-10-CM | POA: Diagnosis not present

## 2019-12-28 DIAGNOSIS — N186 End stage renal disease: Secondary | ICD-10-CM | POA: Diagnosis not present

## 2019-12-28 DIAGNOSIS — Z992 Dependence on renal dialysis: Secondary | ICD-10-CM | POA: Diagnosis not present

## 2019-12-28 DIAGNOSIS — N2581 Secondary hyperparathyroidism of renal origin: Secondary | ICD-10-CM | POA: Diagnosis not present

## 2019-12-30 DIAGNOSIS — N2581 Secondary hyperparathyroidism of renal origin: Secondary | ICD-10-CM | POA: Diagnosis not present

## 2019-12-30 DIAGNOSIS — Z992 Dependence on renal dialysis: Secondary | ICD-10-CM | POA: Diagnosis not present

## 2019-12-30 DIAGNOSIS — R197 Diarrhea, unspecified: Secondary | ICD-10-CM | POA: Diagnosis not present

## 2019-12-30 DIAGNOSIS — N186 End stage renal disease: Secondary | ICD-10-CM | POA: Diagnosis not present

## 2020-01-02 DIAGNOSIS — N186 End stage renal disease: Secondary | ICD-10-CM | POA: Diagnosis not present

## 2020-01-02 DIAGNOSIS — N2581 Secondary hyperparathyroidism of renal origin: Secondary | ICD-10-CM | POA: Diagnosis not present

## 2020-01-02 DIAGNOSIS — Z992 Dependence on renal dialysis: Secondary | ICD-10-CM | POA: Diagnosis not present

## 2020-01-04 DIAGNOSIS — N2581 Secondary hyperparathyroidism of renal origin: Secondary | ICD-10-CM | POA: Diagnosis not present

## 2020-01-04 DIAGNOSIS — N186 End stage renal disease: Secondary | ICD-10-CM | POA: Diagnosis not present

## 2020-01-04 DIAGNOSIS — Z992 Dependence on renal dialysis: Secondary | ICD-10-CM | POA: Diagnosis not present

## 2020-01-06 DIAGNOSIS — N186 End stage renal disease: Secondary | ICD-10-CM | POA: Diagnosis not present

## 2020-01-06 DIAGNOSIS — Z992 Dependence on renal dialysis: Secondary | ICD-10-CM | POA: Diagnosis not present

## 2020-01-06 DIAGNOSIS — N2581 Secondary hyperparathyroidism of renal origin: Secondary | ICD-10-CM | POA: Diagnosis not present

## 2020-01-09 DIAGNOSIS — N186 End stage renal disease: Secondary | ICD-10-CM | POA: Diagnosis not present

## 2020-01-09 DIAGNOSIS — N2581 Secondary hyperparathyroidism of renal origin: Secondary | ICD-10-CM | POA: Diagnosis not present

## 2020-01-09 DIAGNOSIS — Z992 Dependence on renal dialysis: Secondary | ICD-10-CM | POA: Diagnosis not present

## 2020-01-11 DIAGNOSIS — N186 End stage renal disease: Secondary | ICD-10-CM | POA: Diagnosis not present

## 2020-01-11 DIAGNOSIS — Z992 Dependence on renal dialysis: Secondary | ICD-10-CM | POA: Diagnosis not present

## 2020-01-11 DIAGNOSIS — N2581 Secondary hyperparathyroidism of renal origin: Secondary | ICD-10-CM | POA: Diagnosis not present

## 2020-01-13 DIAGNOSIS — N2581 Secondary hyperparathyroidism of renal origin: Secondary | ICD-10-CM | POA: Diagnosis not present

## 2020-01-13 DIAGNOSIS — N186 End stage renal disease: Secondary | ICD-10-CM | POA: Diagnosis not present

## 2020-01-13 DIAGNOSIS — Z992 Dependence on renal dialysis: Secondary | ICD-10-CM | POA: Diagnosis not present

## 2020-01-16 DIAGNOSIS — N186 End stage renal disease: Secondary | ICD-10-CM | POA: Diagnosis not present

## 2020-01-16 DIAGNOSIS — N2581 Secondary hyperparathyroidism of renal origin: Secondary | ICD-10-CM | POA: Diagnosis not present

## 2020-01-16 DIAGNOSIS — Z992 Dependence on renal dialysis: Secondary | ICD-10-CM | POA: Diagnosis not present

## 2020-01-18 DIAGNOSIS — N186 End stage renal disease: Secondary | ICD-10-CM | POA: Diagnosis not present

## 2020-01-18 DIAGNOSIS — Z992 Dependence on renal dialysis: Secondary | ICD-10-CM | POA: Diagnosis not present

## 2020-01-18 DIAGNOSIS — N2581 Secondary hyperparathyroidism of renal origin: Secondary | ICD-10-CM | POA: Diagnosis not present

## 2020-01-20 DIAGNOSIS — Z992 Dependence on renal dialysis: Secondary | ICD-10-CM | POA: Diagnosis not present

## 2020-01-20 DIAGNOSIS — N2581 Secondary hyperparathyroidism of renal origin: Secondary | ICD-10-CM | POA: Diagnosis not present

## 2020-01-20 DIAGNOSIS — N186 End stage renal disease: Secondary | ICD-10-CM | POA: Diagnosis not present

## 2020-01-23 DIAGNOSIS — N186 End stage renal disease: Secondary | ICD-10-CM | POA: Diagnosis not present

## 2020-01-23 DIAGNOSIS — Z992 Dependence on renal dialysis: Secondary | ICD-10-CM | POA: Diagnosis not present

## 2020-01-23 DIAGNOSIS — N2581 Secondary hyperparathyroidism of renal origin: Secondary | ICD-10-CM | POA: Diagnosis not present

## 2020-01-25 DIAGNOSIS — Z992 Dependence on renal dialysis: Secondary | ICD-10-CM | POA: Diagnosis not present

## 2020-01-25 DIAGNOSIS — Z23 Encounter for immunization: Secondary | ICD-10-CM | POA: Diagnosis not present

## 2020-01-25 DIAGNOSIS — N186 End stage renal disease: Secondary | ICD-10-CM | POA: Diagnosis not present

## 2020-01-25 DIAGNOSIS — N2581 Secondary hyperparathyroidism of renal origin: Secondary | ICD-10-CM | POA: Diagnosis not present

## 2020-01-26 DIAGNOSIS — I129 Hypertensive chronic kidney disease with stage 1 through stage 4 chronic kidney disease, or unspecified chronic kidney disease: Secondary | ICD-10-CM | POA: Diagnosis not present

## 2020-01-26 DIAGNOSIS — Z992 Dependence on renal dialysis: Secondary | ICD-10-CM | POA: Diagnosis not present

## 2020-01-26 DIAGNOSIS — N186 End stage renal disease: Secondary | ICD-10-CM | POA: Diagnosis not present

## 2020-01-27 DIAGNOSIS — N2581 Secondary hyperparathyroidism of renal origin: Secondary | ICD-10-CM | POA: Diagnosis not present

## 2020-01-27 DIAGNOSIS — N186 End stage renal disease: Secondary | ICD-10-CM | POA: Diagnosis not present

## 2020-01-27 DIAGNOSIS — Z992 Dependence on renal dialysis: Secondary | ICD-10-CM | POA: Diagnosis not present

## 2020-01-30 DIAGNOSIS — Z992 Dependence on renal dialysis: Secondary | ICD-10-CM | POA: Diagnosis not present

## 2020-01-30 DIAGNOSIS — N2581 Secondary hyperparathyroidism of renal origin: Secondary | ICD-10-CM | POA: Diagnosis not present

## 2020-01-30 DIAGNOSIS — Z23 Encounter for immunization: Secondary | ICD-10-CM | POA: Diagnosis not present

## 2020-01-30 DIAGNOSIS — N186 End stage renal disease: Secondary | ICD-10-CM | POA: Diagnosis not present

## 2020-02-01 DIAGNOSIS — N186 End stage renal disease: Secondary | ICD-10-CM | POA: Diagnosis not present

## 2020-02-01 DIAGNOSIS — Z23 Encounter for immunization: Secondary | ICD-10-CM | POA: Diagnosis not present

## 2020-02-01 DIAGNOSIS — Z992 Dependence on renal dialysis: Secondary | ICD-10-CM | POA: Diagnosis not present

## 2020-02-01 DIAGNOSIS — N2581 Secondary hyperparathyroidism of renal origin: Secondary | ICD-10-CM | POA: Diagnosis not present

## 2020-02-03 DIAGNOSIS — N186 End stage renal disease: Secondary | ICD-10-CM | POA: Diagnosis not present

## 2020-02-03 DIAGNOSIS — Z23 Encounter for immunization: Secondary | ICD-10-CM | POA: Diagnosis not present

## 2020-02-03 DIAGNOSIS — Z992 Dependence on renal dialysis: Secondary | ICD-10-CM | POA: Diagnosis not present

## 2020-02-03 DIAGNOSIS — N2581 Secondary hyperparathyroidism of renal origin: Secondary | ICD-10-CM | POA: Diagnosis not present

## 2020-02-06 DIAGNOSIS — Z992 Dependence on renal dialysis: Secondary | ICD-10-CM | POA: Diagnosis not present

## 2020-02-06 DIAGNOSIS — N186 End stage renal disease: Secondary | ICD-10-CM | POA: Diagnosis not present

## 2020-02-06 DIAGNOSIS — N2581 Secondary hyperparathyroidism of renal origin: Secondary | ICD-10-CM | POA: Diagnosis not present

## 2020-02-07 DIAGNOSIS — H35052 Retinal neovascularization, unspecified, left eye: Secondary | ICD-10-CM | POA: Diagnosis not present

## 2020-02-08 DIAGNOSIS — N2581 Secondary hyperparathyroidism of renal origin: Secondary | ICD-10-CM | POA: Diagnosis not present

## 2020-02-08 DIAGNOSIS — N186 End stage renal disease: Secondary | ICD-10-CM | POA: Diagnosis not present

## 2020-02-08 DIAGNOSIS — Z992 Dependence on renal dialysis: Secondary | ICD-10-CM | POA: Diagnosis not present

## 2020-02-10 DIAGNOSIS — N186 End stage renal disease: Secondary | ICD-10-CM | POA: Diagnosis not present

## 2020-02-10 DIAGNOSIS — Z992 Dependence on renal dialysis: Secondary | ICD-10-CM | POA: Diagnosis not present

## 2020-02-10 DIAGNOSIS — N2581 Secondary hyperparathyroidism of renal origin: Secondary | ICD-10-CM | POA: Diagnosis not present

## 2020-02-13 DIAGNOSIS — Z992 Dependence on renal dialysis: Secondary | ICD-10-CM | POA: Diagnosis not present

## 2020-02-13 DIAGNOSIS — N186 End stage renal disease: Secondary | ICD-10-CM | POA: Diagnosis not present

## 2020-02-13 DIAGNOSIS — N2581 Secondary hyperparathyroidism of renal origin: Secondary | ICD-10-CM | POA: Diagnosis not present

## 2020-02-15 DIAGNOSIS — N186 End stage renal disease: Secondary | ICD-10-CM | POA: Diagnosis not present

## 2020-02-15 DIAGNOSIS — N2581 Secondary hyperparathyroidism of renal origin: Secondary | ICD-10-CM | POA: Diagnosis not present

## 2020-02-15 DIAGNOSIS — Z992 Dependence on renal dialysis: Secondary | ICD-10-CM | POA: Diagnosis not present

## 2020-02-16 DIAGNOSIS — H35052 Retinal neovascularization, unspecified, left eye: Secondary | ICD-10-CM | POA: Diagnosis not present

## 2020-02-17 DIAGNOSIS — N2581 Secondary hyperparathyroidism of renal origin: Secondary | ICD-10-CM | POA: Diagnosis not present

## 2020-02-17 DIAGNOSIS — Z992 Dependence on renal dialysis: Secondary | ICD-10-CM | POA: Diagnosis not present

## 2020-02-17 DIAGNOSIS — N186 End stage renal disease: Secondary | ICD-10-CM | POA: Diagnosis not present

## 2020-02-20 DIAGNOSIS — N2581 Secondary hyperparathyroidism of renal origin: Secondary | ICD-10-CM | POA: Diagnosis not present

## 2020-02-20 DIAGNOSIS — N186 End stage renal disease: Secondary | ICD-10-CM | POA: Diagnosis not present

## 2020-02-20 DIAGNOSIS — Z992 Dependence on renal dialysis: Secondary | ICD-10-CM | POA: Diagnosis not present

## 2020-02-22 DIAGNOSIS — N186 End stage renal disease: Secondary | ICD-10-CM | POA: Diagnosis not present

## 2020-02-22 DIAGNOSIS — N2581 Secondary hyperparathyroidism of renal origin: Secondary | ICD-10-CM | POA: Diagnosis not present

## 2020-02-22 DIAGNOSIS — Z992 Dependence on renal dialysis: Secondary | ICD-10-CM | POA: Diagnosis not present

## 2020-02-24 DIAGNOSIS — Z992 Dependence on renal dialysis: Secondary | ICD-10-CM | POA: Diagnosis not present

## 2020-02-24 DIAGNOSIS — N186 End stage renal disease: Secondary | ICD-10-CM | POA: Diagnosis not present

## 2020-02-24 DIAGNOSIS — N2581 Secondary hyperparathyroidism of renal origin: Secondary | ICD-10-CM | POA: Diagnosis not present

## 2020-02-26 DIAGNOSIS — Z992 Dependence on renal dialysis: Secondary | ICD-10-CM | POA: Diagnosis not present

## 2020-02-26 DIAGNOSIS — N186 End stage renal disease: Secondary | ICD-10-CM | POA: Diagnosis not present

## 2020-02-26 DIAGNOSIS — I129 Hypertensive chronic kidney disease with stage 1 through stage 4 chronic kidney disease, or unspecified chronic kidney disease: Secondary | ICD-10-CM | POA: Diagnosis not present

## 2020-02-27 DIAGNOSIS — N2581 Secondary hyperparathyroidism of renal origin: Secondary | ICD-10-CM | POA: Diagnosis not present

## 2020-02-27 DIAGNOSIS — Z992 Dependence on renal dialysis: Secondary | ICD-10-CM | POA: Diagnosis not present

## 2020-02-27 DIAGNOSIS — N186 End stage renal disease: Secondary | ICD-10-CM | POA: Diagnosis not present

## 2020-02-29 DIAGNOSIS — K219 Gastro-esophageal reflux disease without esophagitis: Secondary | ICD-10-CM | POA: Diagnosis not present

## 2020-02-29 DIAGNOSIS — E785 Hyperlipidemia, unspecified: Secondary | ICD-10-CM | POA: Diagnosis not present

## 2020-02-29 DIAGNOSIS — N2581 Secondary hyperparathyroidism of renal origin: Secondary | ICD-10-CM | POA: Diagnosis not present

## 2020-02-29 DIAGNOSIS — N390 Urinary tract infection, site not specified: Secondary | ICD-10-CM | POA: Diagnosis not present

## 2020-02-29 DIAGNOSIS — Z1331 Encounter for screening for depression: Secondary | ICD-10-CM | POA: Diagnosis not present

## 2020-02-29 DIAGNOSIS — R7303 Prediabetes: Secondary | ICD-10-CM | POA: Diagnosis not present

## 2020-02-29 DIAGNOSIS — I10 Essential (primary) hypertension: Secondary | ICD-10-CM | POA: Diagnosis not present

## 2020-02-29 DIAGNOSIS — N186 End stage renal disease: Secondary | ICD-10-CM | POA: Diagnosis not present

## 2020-02-29 DIAGNOSIS — Z87440 Personal history of urinary (tract) infections: Secondary | ICD-10-CM | POA: Diagnosis not present

## 2020-02-29 DIAGNOSIS — Z992 Dependence on renal dialysis: Secondary | ICD-10-CM | POA: Diagnosis not present

## 2020-03-02 DIAGNOSIS — N2581 Secondary hyperparathyroidism of renal origin: Secondary | ICD-10-CM | POA: Diagnosis not present

## 2020-03-02 DIAGNOSIS — N186 End stage renal disease: Secondary | ICD-10-CM | POA: Diagnosis not present

## 2020-03-02 DIAGNOSIS — Z992 Dependence on renal dialysis: Secondary | ICD-10-CM | POA: Diagnosis not present

## 2020-03-05 DIAGNOSIS — N2581 Secondary hyperparathyroidism of renal origin: Secondary | ICD-10-CM | POA: Diagnosis not present

## 2020-03-05 DIAGNOSIS — Z992 Dependence on renal dialysis: Secondary | ICD-10-CM | POA: Diagnosis not present

## 2020-03-05 DIAGNOSIS — N186 End stage renal disease: Secondary | ICD-10-CM | POA: Diagnosis not present

## 2020-03-06 DIAGNOSIS — Z992 Dependence on renal dialysis: Secondary | ICD-10-CM | POA: Diagnosis not present

## 2020-03-06 DIAGNOSIS — N186 End stage renal disease: Secondary | ICD-10-CM | POA: Diagnosis not present

## 2020-03-06 DIAGNOSIS — N2581 Secondary hyperparathyroidism of renal origin: Secondary | ICD-10-CM | POA: Diagnosis not present

## 2020-03-09 DIAGNOSIS — N186 End stage renal disease: Secondary | ICD-10-CM | POA: Diagnosis not present

## 2020-03-09 DIAGNOSIS — N2581 Secondary hyperparathyroidism of renal origin: Secondary | ICD-10-CM | POA: Diagnosis not present

## 2020-03-09 DIAGNOSIS — Z992 Dependence on renal dialysis: Secondary | ICD-10-CM | POA: Diagnosis not present

## 2020-03-12 DIAGNOSIS — Z992 Dependence on renal dialysis: Secondary | ICD-10-CM | POA: Diagnosis not present

## 2020-03-12 DIAGNOSIS — N2581 Secondary hyperparathyroidism of renal origin: Secondary | ICD-10-CM | POA: Diagnosis not present

## 2020-03-12 DIAGNOSIS — N186 End stage renal disease: Secondary | ICD-10-CM | POA: Diagnosis not present

## 2020-03-14 DIAGNOSIS — Z992 Dependence on renal dialysis: Secondary | ICD-10-CM | POA: Diagnosis not present

## 2020-03-14 DIAGNOSIS — N186 End stage renal disease: Secondary | ICD-10-CM | POA: Diagnosis not present

## 2020-03-14 DIAGNOSIS — N2581 Secondary hyperparathyroidism of renal origin: Secondary | ICD-10-CM | POA: Diagnosis not present

## 2020-03-16 DIAGNOSIS — N186 End stage renal disease: Secondary | ICD-10-CM | POA: Diagnosis not present

## 2020-03-16 DIAGNOSIS — N2581 Secondary hyperparathyroidism of renal origin: Secondary | ICD-10-CM | POA: Diagnosis not present

## 2020-03-16 DIAGNOSIS — Z992 Dependence on renal dialysis: Secondary | ICD-10-CM | POA: Diagnosis not present

## 2020-03-18 DIAGNOSIS — Z992 Dependence on renal dialysis: Secondary | ICD-10-CM | POA: Diagnosis not present

## 2020-03-18 DIAGNOSIS — N186 End stage renal disease: Secondary | ICD-10-CM | POA: Diagnosis not present

## 2020-03-18 DIAGNOSIS — N2581 Secondary hyperparathyroidism of renal origin: Secondary | ICD-10-CM | POA: Diagnosis not present

## 2020-03-20 DIAGNOSIS — N2581 Secondary hyperparathyroidism of renal origin: Secondary | ICD-10-CM | POA: Diagnosis not present

## 2020-03-20 DIAGNOSIS — N186 End stage renal disease: Secondary | ICD-10-CM | POA: Diagnosis not present

## 2020-03-20 DIAGNOSIS — Z992 Dependence on renal dialysis: Secondary | ICD-10-CM | POA: Diagnosis not present

## 2020-03-23 DIAGNOSIS — Z992 Dependence on renal dialysis: Secondary | ICD-10-CM | POA: Diagnosis not present

## 2020-03-23 DIAGNOSIS — N2581 Secondary hyperparathyroidism of renal origin: Secondary | ICD-10-CM | POA: Diagnosis not present

## 2020-03-23 DIAGNOSIS — N186 End stage renal disease: Secondary | ICD-10-CM | POA: Diagnosis not present

## 2020-03-26 DIAGNOSIS — R197 Diarrhea, unspecified: Secondary | ICD-10-CM | POA: Diagnosis not present

## 2020-03-26 DIAGNOSIS — N2581 Secondary hyperparathyroidism of renal origin: Secondary | ICD-10-CM | POA: Diagnosis not present

## 2020-03-26 DIAGNOSIS — Z992 Dependence on renal dialysis: Secondary | ICD-10-CM | POA: Diagnosis not present

## 2020-03-26 DIAGNOSIS — N186 End stage renal disease: Secondary | ICD-10-CM | POA: Diagnosis not present

## 2020-03-27 DIAGNOSIS — I129 Hypertensive chronic kidney disease with stage 1 through stage 4 chronic kidney disease, or unspecified chronic kidney disease: Secondary | ICD-10-CM | POA: Diagnosis not present

## 2020-03-27 DIAGNOSIS — N186 End stage renal disease: Secondary | ICD-10-CM | POA: Diagnosis not present

## 2020-03-27 DIAGNOSIS — Z992 Dependence on renal dialysis: Secondary | ICD-10-CM | POA: Diagnosis not present

## 2020-03-28 ENCOUNTER — Encounter: Payer: Self-pay | Admitting: Internal Medicine

## 2020-03-28 ENCOUNTER — Ambulatory Visit: Payer: BC Managed Care – PPO | Admitting: Internal Medicine

## 2020-03-28 VITALS — BP 216/90 | HR 71 | Ht 62.5 in | Wt 178.6 lb

## 2020-03-28 DIAGNOSIS — R112 Nausea with vomiting, unspecified: Secondary | ICD-10-CM | POA: Diagnosis not present

## 2020-03-28 DIAGNOSIS — R159 Full incontinence of feces: Secondary | ICD-10-CM

## 2020-03-28 DIAGNOSIS — K222 Esophageal obstruction: Secondary | ICD-10-CM | POA: Diagnosis not present

## 2020-03-28 DIAGNOSIS — K219 Gastro-esophageal reflux disease without esophagitis: Secondary | ICD-10-CM

## 2020-03-28 DIAGNOSIS — R197 Diarrhea, unspecified: Secondary | ICD-10-CM | POA: Diagnosis not present

## 2020-03-28 DIAGNOSIS — Z992 Dependence on renal dialysis: Secondary | ICD-10-CM | POA: Diagnosis not present

## 2020-03-28 DIAGNOSIS — N186 End stage renal disease: Secondary | ICD-10-CM | POA: Diagnosis not present

## 2020-03-28 DIAGNOSIS — N2581 Secondary hyperparathyroidism of renal origin: Secondary | ICD-10-CM | POA: Diagnosis not present

## 2020-03-28 MED ORDER — OMEPRAZOLE 40 MG PO CPDR: 40.0000 mg | DELAYED_RELEASE_CAPSULE | Freq: Every day | ORAL | 3 refills | Status: DC

## 2020-03-28 NOTE — Progress Notes (Signed)
HISTORY OF PRESENT ILLNESS:  Alison Baker is a 64 y.o. female with end-stage renal disease on dialysis GERD complicated by peptic stricture who presents today regarding problems with chronic diarrhea and fecal incontinence.  The patient was last seen May 07, 2018 when she underwent colonoscopy for surveillance (prior examinations elsewhere without records) and upper endoscopy for GERD and dysphagia.  Colonoscopy revealed a diminutive tubular adenoma for which follow-up in 5 years was recommended.  Occasional sigmoid diverticula present.  Upper endoscopy revealed benign distal esophageal stricture which was dilated.  Also esophagitis.  She was prescribed omeprazole.  She tells me that she has stopped her omeprazole.  She does have occasional reflux symptoms.  As well with severe indigestion vomiting spells.  She is wondering if she should resume her PPI.  Next, she reports 5 to 6-year history of problems with chronic diarrhea.  She describes problems with diarrhea exacerbated by meals.  She did undergo cholecystectomy in 2018.  She will take 2 Imodium 3 times per week prior to dialysis.  She may go several days without a bowel movement.  Associated with her diarrhea significant incontinence.  She must wear protective undergarments at all times.  No bleeding.  No new medications.  Review of outside records from the dialysis center.  Hemoglobin from September 2021  -10.1.  She has completed her Covid vaccination series  REVIEW OF SYSTEMS:  All non-GI ROS negative unless otherwise stated in the HPI-except for anxiety  Past Medical History:  Diagnosis Date  . Anemia   . Anxiety   . Complication of anesthesia    slow to awaken, sentive to medications rarely even takes a Tylenol.  . Diarrhea   . End-stage kidney disease ([place])    M/W/F- [facility]  . Gastric ulcer with hemorrhage   . GERD (gastroesophageal reflux disease)   . Hyperlipidemia   . Hypertension   . Pneumonia   . PONV (postoperative  nausea and vomiting)    after hysterectomy- 1980's  . Pre-diabetes   . Umbilical hernia     Past Surgical History:  Procedure Laterality Date  . APPENDECTOMY    . BASCILIC VEIN TRANSPOSITION Left 07/15/2016   Procedure: BRACHIAL VEIN TRANSPOSITION FIRST STAGE;  Surgeon: Conrad Fuig, MD;  Location: [facility];  Service: Vascular;  Laterality: Left;  . BASCILIC VEIN TRANSPOSITION Left 11/04/2016   Procedure: SECOND STAGE BRACHIAL VEIN TRANSPOSITION;  Surgeon: Conrad , MD;  Location: [facility];  Service: Vascular;  Laterality: Left;  . CHOLECYSTECTOMY     partial hepatectomy  . COLONOSCOPY W/ POLYPECTOMY    . EYE SURGERY Right    catarct  . EYE SURGERY Left 08/2016   cataract  . INSERTION OF DIALYSIS CATHETER    . IR GENERIC HISTORICAL  06/23/2016   IR US GUIDE VASC ACCESS RIGHT 06/23/2016 Arne Cleveland, MD [facility]  . IR GENERIC HISTORICAL  06/23/2016   IR FLUORO GUIDE CV LINE RIGHT 06/23/2016 Arne Cleveland, MD [facility]  . TOTAL ABDOMINAL HYSTERECTOMY W/ BILATERAL SALPINGOOPHORECTOMY    . TRABECULECTOMY Bilateral     Social History Alison Baker  reports that she quit smoking about 24 years ago. Her smoking use included cigarettes. She has never used smokeless tobacco. She reports that she does not drink alcohol and does not use drugs.  family history includes Cancer in her sister; Hypertension in her mother.  Allergies  Allergen Reactions  . Nsaids Other (See Comments)    Has a healing stomach ulcer  . Penicillins  Rash and Other (See Comments)    Rash and rib pain.  PATIENT HAD A PCN REACTION WITH IMMEDIATE RASH, FACIAL/TONGUE/THROAT SWELLING, SOB, OR LIGHTHEADEDNESS WITH HYPOTENSION:  #  #  #  YES  #  #  #   Has patient had a PCN reaction causing severe rash involving mucus membranes or skin necrosis: No Has patient had a PCN reaction that required hospitalization: No Has patient had a PCN reaction occurring within the last 10 years: No.   . Strawberry Extract  Swelling    SWELLING REACTION UNSPECIFIED   . Clonidine Nausea And Vomiting  . Sulfa Antibiotics Rash and Other (See Comments)    Rib pain       PHYSICAL EXAMINATION: Vital signs: BP (!) 216/90   Pulse 71   Ht 5' 2.5" (1.588 m)   Wt 178 lb 9.6 oz (81 kg)   SpO2 98%   BMI 32.15 kg/m   Constitutional: generally well-appearing, no acute distress Psychiatric: alert and oriented x3, cooperative Eyes: extraocular movements intact, anicteric, conjunctiva pink Mouth: oral pharynx moist, no lesions Neck: supple no lymphadenopathy Cardiovascular: heart regular rate and rhythm, no murmur Lungs: clear to auscultation bilaterally Abdomen: soft, nontender, nondistended, no obvious ascites, no peritoneal signs, normal bowel sounds, no organomegaly Rectal: Omitted Extremities: no clubbing, cyanosis, or lower extremity edema bilaterally Skin: no lesions on visible extremities Neuro: No focal deficits.  Cranial nerves intact  ASSESSMENT:  1.  GERD complicated by peptic stricture.  No recurrent dysphagia post dilation.  She does have breakthrough reflux symptoms.  As well episodic vomiting likely due to breakthrough GERD. 2.  Chronic diarrhea with incontinence.  Intermittent constipation after using Imodium.  Question IBS.  Question bile salt related. 3.  History of diminutive adenomatous colon polyp January 2020 4.  Multiple medical problems including end-stage renal disease on dialysis   PLAN:  1.  Reflux precautions 2.  Prescribed omeprazole 40 mg daily.  Medication risks reviewed. 3.  Recommend Metamucil 2 tablespoons daily to see if this improves bowel habits and consistency 4.  Continue with protective undergarments 5.  Routine office follow-up 6 weeks 6.  Persistent problems, consider Colestid 7.  Repeat surveillance colonoscopy around January 2025-2027

## 2020-03-28 NOTE — Patient Instructions (Addendum)
We have sent the following medications to your pharmacy for you to pick up at your convenience:  Omeprazole  Take 2 tablespoons of Metamucil daily.  Please follow up on _________________________

## 2020-03-29 DIAGNOSIS — H35053 Retinal neovascularization, unspecified, bilateral: Secondary | ICD-10-CM | POA: Diagnosis not present

## 2020-03-30 DIAGNOSIS — N2581 Secondary hyperparathyroidism of renal origin: Secondary | ICD-10-CM | POA: Diagnosis not present

## 2020-03-30 DIAGNOSIS — Z992 Dependence on renal dialysis: Secondary | ICD-10-CM | POA: Diagnosis not present

## 2020-03-30 DIAGNOSIS — N186 End stage renal disease: Secondary | ICD-10-CM | POA: Diagnosis not present

## 2020-04-02 DIAGNOSIS — Z992 Dependence on renal dialysis: Secondary | ICD-10-CM | POA: Diagnosis not present

## 2020-04-02 DIAGNOSIS — N2581 Secondary hyperparathyroidism of renal origin: Secondary | ICD-10-CM | POA: Diagnosis not present

## 2020-04-02 DIAGNOSIS — N186 End stage renal disease: Secondary | ICD-10-CM | POA: Diagnosis not present

## 2020-04-04 DIAGNOSIS — Z992 Dependence on renal dialysis: Secondary | ICD-10-CM | POA: Diagnosis not present

## 2020-04-04 DIAGNOSIS — N2581 Secondary hyperparathyroidism of renal origin: Secondary | ICD-10-CM | POA: Diagnosis not present

## 2020-04-04 DIAGNOSIS — N186 End stage renal disease: Secondary | ICD-10-CM | POA: Diagnosis not present

## 2020-04-06 DIAGNOSIS — N186 End stage renal disease: Secondary | ICD-10-CM | POA: Diagnosis not present

## 2020-04-06 DIAGNOSIS — N2581 Secondary hyperparathyroidism of renal origin: Secondary | ICD-10-CM | POA: Diagnosis not present

## 2020-04-06 DIAGNOSIS — Z992 Dependence on renal dialysis: Secondary | ICD-10-CM | POA: Diagnosis not present

## 2020-04-09 DIAGNOSIS — N186 End stage renal disease: Secondary | ICD-10-CM | POA: Diagnosis not present

## 2020-04-09 DIAGNOSIS — N2581 Secondary hyperparathyroidism of renal origin: Secondary | ICD-10-CM | POA: Diagnosis not present

## 2020-04-09 DIAGNOSIS — Z992 Dependence on renal dialysis: Secondary | ICD-10-CM | POA: Diagnosis not present

## 2020-04-10 ENCOUNTER — Telehealth: Payer: Self-pay | Admitting: Internal Medicine

## 2020-04-10 NOTE — Telephone Encounter (Signed)
Pt states she took metamucil 2 TBSP daily and it made her constipated. States she had a lot of bloating and did not have a BM for a week. She wanted to know what Dr. Henrene Pastor thought about her taking a lower dose of metamucil. She asked about a tsp in 8 oz of water since she is so fluid restricted due to her kidneys. She stopped the metamucil and did take it Sat, Sun, or Monday and today she reports she had a soft BM and did not have to strain. Please advise.

## 2020-04-10 NOTE — Telephone Encounter (Signed)
Try one table spoon daily. Adjust as needed

## 2020-04-10 NOTE — Telephone Encounter (Signed)
Pt is requesting a call back from a nurse to discuss the Metamucil over the counter. Pt states the medicine made her constipated.

## 2020-04-11 DIAGNOSIS — N2581 Secondary hyperparathyroidism of renal origin: Secondary | ICD-10-CM | POA: Diagnosis not present

## 2020-04-11 DIAGNOSIS — N186 End stage renal disease: Secondary | ICD-10-CM | POA: Diagnosis not present

## 2020-04-11 DIAGNOSIS — Z992 Dependence on renal dialysis: Secondary | ICD-10-CM | POA: Diagnosis not present

## 2020-04-11 NOTE — Telephone Encounter (Signed)
Spoke with pt and she is aware.

## 2020-04-13 DIAGNOSIS — N186 End stage renal disease: Secondary | ICD-10-CM | POA: Diagnosis not present

## 2020-04-13 DIAGNOSIS — Z992 Dependence on renal dialysis: Secondary | ICD-10-CM | POA: Diagnosis not present

## 2020-04-13 DIAGNOSIS — N2581 Secondary hyperparathyroidism of renal origin: Secondary | ICD-10-CM | POA: Diagnosis not present

## 2020-04-16 DIAGNOSIS — N186 End stage renal disease: Secondary | ICD-10-CM | POA: Diagnosis not present

## 2020-04-16 DIAGNOSIS — N2581 Secondary hyperparathyroidism of renal origin: Secondary | ICD-10-CM | POA: Diagnosis not present

## 2020-04-16 DIAGNOSIS — Z992 Dependence on renal dialysis: Secondary | ICD-10-CM | POA: Diagnosis not present

## 2020-04-18 DIAGNOSIS — N2581 Secondary hyperparathyroidism of renal origin: Secondary | ICD-10-CM | POA: Diagnosis not present

## 2020-04-18 DIAGNOSIS — Z992 Dependence on renal dialysis: Secondary | ICD-10-CM | POA: Diagnosis not present

## 2020-04-18 DIAGNOSIS — N186 End stage renal disease: Secondary | ICD-10-CM | POA: Diagnosis not present

## 2020-04-20 DIAGNOSIS — N186 End stage renal disease: Secondary | ICD-10-CM | POA: Diagnosis not present

## 2020-04-20 DIAGNOSIS — Z992 Dependence on renal dialysis: Secondary | ICD-10-CM | POA: Diagnosis not present

## 2020-04-20 DIAGNOSIS — N2581 Secondary hyperparathyroidism of renal origin: Secondary | ICD-10-CM | POA: Diagnosis not present

## 2020-04-23 DIAGNOSIS — N2581 Secondary hyperparathyroidism of renal origin: Secondary | ICD-10-CM | POA: Diagnosis not present

## 2020-04-23 DIAGNOSIS — Z992 Dependence on renal dialysis: Secondary | ICD-10-CM | POA: Diagnosis not present

## 2020-04-23 DIAGNOSIS — N186 End stage renal disease: Secondary | ICD-10-CM | POA: Diagnosis not present

## 2020-04-25 DIAGNOSIS — N186 End stage renal disease: Secondary | ICD-10-CM | POA: Diagnosis not present

## 2020-04-25 DIAGNOSIS — Z992 Dependence on renal dialysis: Secondary | ICD-10-CM | POA: Diagnosis not present

## 2020-04-25 DIAGNOSIS — N2581 Secondary hyperparathyroidism of renal origin: Secondary | ICD-10-CM | POA: Diagnosis not present

## 2020-04-27 DIAGNOSIS — N2581 Secondary hyperparathyroidism of renal origin: Secondary | ICD-10-CM | POA: Diagnosis not present

## 2020-04-27 DIAGNOSIS — Z992 Dependence on renal dialysis: Secondary | ICD-10-CM | POA: Diagnosis not present

## 2020-04-27 DIAGNOSIS — N186 End stage renal disease: Secondary | ICD-10-CM | POA: Diagnosis not present

## 2020-04-27 DIAGNOSIS — I129 Hypertensive chronic kidney disease with stage 1 through stage 4 chronic kidney disease, or unspecified chronic kidney disease: Secondary | ICD-10-CM | POA: Diagnosis not present

## 2020-04-30 DIAGNOSIS — Z992 Dependence on renal dialysis: Secondary | ICD-10-CM | POA: Diagnosis not present

## 2020-04-30 DIAGNOSIS — N186 End stage renal disease: Secondary | ICD-10-CM | POA: Diagnosis not present

## 2020-04-30 DIAGNOSIS — N2581 Secondary hyperparathyroidism of renal origin: Secondary | ICD-10-CM | POA: Diagnosis not present

## 2020-05-02 DIAGNOSIS — N186 End stage renal disease: Secondary | ICD-10-CM | POA: Diagnosis not present

## 2020-05-02 DIAGNOSIS — Z992 Dependence on renal dialysis: Secondary | ICD-10-CM | POA: Diagnosis not present

## 2020-05-02 DIAGNOSIS — N2581 Secondary hyperparathyroidism of renal origin: Secondary | ICD-10-CM | POA: Diagnosis not present

## 2020-05-04 DIAGNOSIS — N2581 Secondary hyperparathyroidism of renal origin: Secondary | ICD-10-CM | POA: Diagnosis not present

## 2020-05-04 DIAGNOSIS — N186 End stage renal disease: Secondary | ICD-10-CM | POA: Diagnosis not present

## 2020-05-04 DIAGNOSIS — Z992 Dependence on renal dialysis: Secondary | ICD-10-CM | POA: Diagnosis not present

## 2020-05-07 DIAGNOSIS — N186 End stage renal disease: Secondary | ICD-10-CM | POA: Diagnosis not present

## 2020-05-07 DIAGNOSIS — N2581 Secondary hyperparathyroidism of renal origin: Secondary | ICD-10-CM | POA: Diagnosis not present

## 2020-05-07 DIAGNOSIS — Z992 Dependence on renal dialysis: Secondary | ICD-10-CM | POA: Diagnosis not present

## 2020-05-09 DIAGNOSIS — N186 End stage renal disease: Secondary | ICD-10-CM | POA: Diagnosis not present

## 2020-05-09 DIAGNOSIS — Z992 Dependence on renal dialysis: Secondary | ICD-10-CM | POA: Diagnosis not present

## 2020-05-09 DIAGNOSIS — N2581 Secondary hyperparathyroidism of renal origin: Secondary | ICD-10-CM | POA: Diagnosis not present

## 2020-05-10 ENCOUNTER — Ambulatory Visit: Payer: BC Managed Care – PPO | Admitting: Internal Medicine

## 2020-05-11 DIAGNOSIS — N186 End stage renal disease: Secondary | ICD-10-CM | POA: Diagnosis not present

## 2020-05-11 DIAGNOSIS — Z992 Dependence on renal dialysis: Secondary | ICD-10-CM | POA: Diagnosis not present

## 2020-05-11 DIAGNOSIS — N2581 Secondary hyperparathyroidism of renal origin: Secondary | ICD-10-CM | POA: Diagnosis not present

## 2020-05-14 DIAGNOSIS — N2581 Secondary hyperparathyroidism of renal origin: Secondary | ICD-10-CM | POA: Diagnosis not present

## 2020-05-14 DIAGNOSIS — N186 End stage renal disease: Secondary | ICD-10-CM | POA: Diagnosis not present

## 2020-05-14 DIAGNOSIS — Z992 Dependence on renal dialysis: Secondary | ICD-10-CM | POA: Diagnosis not present

## 2020-05-16 DIAGNOSIS — Z992 Dependence on renal dialysis: Secondary | ICD-10-CM | POA: Diagnosis not present

## 2020-05-16 DIAGNOSIS — N186 End stage renal disease: Secondary | ICD-10-CM | POA: Diagnosis not present

## 2020-05-16 DIAGNOSIS — N2581 Secondary hyperparathyroidism of renal origin: Secondary | ICD-10-CM | POA: Diagnosis not present

## 2020-05-18 DIAGNOSIS — N2581 Secondary hyperparathyroidism of renal origin: Secondary | ICD-10-CM | POA: Diagnosis not present

## 2020-05-18 DIAGNOSIS — N186 End stage renal disease: Secondary | ICD-10-CM | POA: Diagnosis not present

## 2020-05-18 DIAGNOSIS — Z992 Dependence on renal dialysis: Secondary | ICD-10-CM | POA: Diagnosis not present

## 2020-05-21 DIAGNOSIS — N186 End stage renal disease: Secondary | ICD-10-CM | POA: Diagnosis not present

## 2020-05-21 DIAGNOSIS — N2581 Secondary hyperparathyroidism of renal origin: Secondary | ICD-10-CM | POA: Diagnosis not present

## 2020-05-21 DIAGNOSIS — Z992 Dependence on renal dialysis: Secondary | ICD-10-CM | POA: Diagnosis not present

## 2020-05-23 DIAGNOSIS — N2581 Secondary hyperparathyroidism of renal origin: Secondary | ICD-10-CM | POA: Diagnosis not present

## 2020-05-23 DIAGNOSIS — Z992 Dependence on renal dialysis: Secondary | ICD-10-CM | POA: Diagnosis not present

## 2020-05-23 DIAGNOSIS — N186 End stage renal disease: Secondary | ICD-10-CM | POA: Diagnosis not present

## 2020-05-25 DIAGNOSIS — Z992 Dependence on renal dialysis: Secondary | ICD-10-CM | POA: Diagnosis not present

## 2020-05-25 DIAGNOSIS — N186 End stage renal disease: Secondary | ICD-10-CM | POA: Diagnosis not present

## 2020-05-25 DIAGNOSIS — N2581 Secondary hyperparathyroidism of renal origin: Secondary | ICD-10-CM | POA: Diagnosis not present

## 2020-05-28 DIAGNOSIS — I129 Hypertensive chronic kidney disease with stage 1 through stage 4 chronic kidney disease, or unspecified chronic kidney disease: Secondary | ICD-10-CM | POA: Diagnosis not present

## 2020-05-28 DIAGNOSIS — N186 End stage renal disease: Secondary | ICD-10-CM | POA: Diagnosis not present

## 2020-05-28 DIAGNOSIS — N2581 Secondary hyperparathyroidism of renal origin: Secondary | ICD-10-CM | POA: Diagnosis not present

## 2020-05-28 DIAGNOSIS — Z992 Dependence on renal dialysis: Secondary | ICD-10-CM | POA: Diagnosis not present

## 2020-05-30 DIAGNOSIS — N186 End stage renal disease: Secondary | ICD-10-CM | POA: Diagnosis not present

## 2020-05-30 DIAGNOSIS — N2581 Secondary hyperparathyroidism of renal origin: Secondary | ICD-10-CM | POA: Diagnosis not present

## 2020-05-30 DIAGNOSIS — Z992 Dependence on renal dialysis: Secondary | ICD-10-CM | POA: Diagnosis not present

## 2020-06-01 DIAGNOSIS — N2581 Secondary hyperparathyroidism of renal origin: Secondary | ICD-10-CM | POA: Diagnosis not present

## 2020-06-01 DIAGNOSIS — N186 End stage renal disease: Secondary | ICD-10-CM | POA: Diagnosis not present

## 2020-06-01 DIAGNOSIS — Z992 Dependence on renal dialysis: Secondary | ICD-10-CM | POA: Diagnosis not present

## 2020-06-04 DIAGNOSIS — Z992 Dependence on renal dialysis: Secondary | ICD-10-CM | POA: Diagnosis not present

## 2020-06-04 DIAGNOSIS — N2581 Secondary hyperparathyroidism of renal origin: Secondary | ICD-10-CM | POA: Diagnosis not present

## 2020-06-04 DIAGNOSIS — N186 End stage renal disease: Secondary | ICD-10-CM | POA: Diagnosis not present

## 2020-06-06 DIAGNOSIS — N186 End stage renal disease: Secondary | ICD-10-CM | POA: Diagnosis not present

## 2020-06-06 DIAGNOSIS — Z992 Dependence on renal dialysis: Secondary | ICD-10-CM | POA: Diagnosis not present

## 2020-06-06 DIAGNOSIS — N2581 Secondary hyperparathyroidism of renal origin: Secondary | ICD-10-CM | POA: Diagnosis not present

## 2020-06-08 DIAGNOSIS — N186 End stage renal disease: Secondary | ICD-10-CM | POA: Diagnosis not present

## 2020-06-08 DIAGNOSIS — Z992 Dependence on renal dialysis: Secondary | ICD-10-CM | POA: Diagnosis not present

## 2020-06-08 DIAGNOSIS — N2581 Secondary hyperparathyroidism of renal origin: Secondary | ICD-10-CM | POA: Diagnosis not present

## 2020-06-11 DIAGNOSIS — Z992 Dependence on renal dialysis: Secondary | ICD-10-CM | POA: Diagnosis not present

## 2020-06-11 DIAGNOSIS — N186 End stage renal disease: Secondary | ICD-10-CM | POA: Diagnosis not present

## 2020-06-11 DIAGNOSIS — N2581 Secondary hyperparathyroidism of renal origin: Secondary | ICD-10-CM | POA: Diagnosis not present

## 2020-06-13 DIAGNOSIS — Z992 Dependence on renal dialysis: Secondary | ICD-10-CM | POA: Diagnosis not present

## 2020-06-13 DIAGNOSIS — N2581 Secondary hyperparathyroidism of renal origin: Secondary | ICD-10-CM | POA: Diagnosis not present

## 2020-06-13 DIAGNOSIS — N186 End stage renal disease: Secondary | ICD-10-CM | POA: Diagnosis not present

## 2020-06-15 DIAGNOSIS — N2581 Secondary hyperparathyroidism of renal origin: Secondary | ICD-10-CM | POA: Diagnosis not present

## 2020-06-15 DIAGNOSIS — N186 End stage renal disease: Secondary | ICD-10-CM | POA: Diagnosis not present

## 2020-06-15 DIAGNOSIS — Z992 Dependence on renal dialysis: Secondary | ICD-10-CM | POA: Diagnosis not present

## 2020-06-18 DIAGNOSIS — R197 Diarrhea, unspecified: Secondary | ICD-10-CM | POA: Diagnosis not present

## 2020-06-18 DIAGNOSIS — Z992 Dependence on renal dialysis: Secondary | ICD-10-CM | POA: Diagnosis not present

## 2020-06-18 DIAGNOSIS — N2581 Secondary hyperparathyroidism of renal origin: Secondary | ICD-10-CM | POA: Diagnosis not present

## 2020-06-18 DIAGNOSIS — N186 End stage renal disease: Secondary | ICD-10-CM | POA: Diagnosis not present

## 2020-06-20 DIAGNOSIS — N186 End stage renal disease: Secondary | ICD-10-CM | POA: Diagnosis not present

## 2020-06-20 DIAGNOSIS — N2581 Secondary hyperparathyroidism of renal origin: Secondary | ICD-10-CM | POA: Diagnosis not present

## 2020-06-20 DIAGNOSIS — R197 Diarrhea, unspecified: Secondary | ICD-10-CM | POA: Diagnosis not present

## 2020-06-20 DIAGNOSIS — Z992 Dependence on renal dialysis: Secondary | ICD-10-CM | POA: Diagnosis not present

## 2020-06-22 DIAGNOSIS — N2581 Secondary hyperparathyroidism of renal origin: Secondary | ICD-10-CM | POA: Diagnosis not present

## 2020-06-22 DIAGNOSIS — N186 End stage renal disease: Secondary | ICD-10-CM | POA: Diagnosis not present

## 2020-06-22 DIAGNOSIS — Z992 Dependence on renal dialysis: Secondary | ICD-10-CM | POA: Diagnosis not present

## 2020-06-22 DIAGNOSIS — R197 Diarrhea, unspecified: Secondary | ICD-10-CM | POA: Diagnosis not present

## 2020-06-25 DIAGNOSIS — I129 Hypertensive chronic kidney disease with stage 1 through stage 4 chronic kidney disease, or unspecified chronic kidney disease: Secondary | ICD-10-CM | POA: Diagnosis not present

## 2020-06-25 DIAGNOSIS — N2581 Secondary hyperparathyroidism of renal origin: Secondary | ICD-10-CM | POA: Diagnosis not present

## 2020-06-25 DIAGNOSIS — Z992 Dependence on renal dialysis: Secondary | ICD-10-CM | POA: Diagnosis not present

## 2020-06-25 DIAGNOSIS — N186 End stage renal disease: Secondary | ICD-10-CM | POA: Diagnosis not present

## 2020-06-27 DIAGNOSIS — Z992 Dependence on renal dialysis: Secondary | ICD-10-CM | POA: Diagnosis not present

## 2020-06-27 DIAGNOSIS — N186 End stage renal disease: Secondary | ICD-10-CM | POA: Diagnosis not present

## 2020-06-27 DIAGNOSIS — N2581 Secondary hyperparathyroidism of renal origin: Secondary | ICD-10-CM | POA: Diagnosis not present

## 2020-06-29 DIAGNOSIS — N186 End stage renal disease: Secondary | ICD-10-CM | POA: Diagnosis not present

## 2020-06-29 DIAGNOSIS — N2581 Secondary hyperparathyroidism of renal origin: Secondary | ICD-10-CM | POA: Diagnosis not present

## 2020-06-29 DIAGNOSIS — Z992 Dependence on renal dialysis: Secondary | ICD-10-CM | POA: Diagnosis not present

## 2020-07-02 DIAGNOSIS — N2581 Secondary hyperparathyroidism of renal origin: Secondary | ICD-10-CM | POA: Diagnosis not present

## 2020-07-02 DIAGNOSIS — N186 End stage renal disease: Secondary | ICD-10-CM | POA: Diagnosis not present

## 2020-07-02 DIAGNOSIS — Z992 Dependence on renal dialysis: Secondary | ICD-10-CM | POA: Diagnosis not present

## 2020-07-05 DIAGNOSIS — N2581 Secondary hyperparathyroidism of renal origin: Secondary | ICD-10-CM | POA: Diagnosis not present

## 2020-07-05 DIAGNOSIS — Z992 Dependence on renal dialysis: Secondary | ICD-10-CM | POA: Diagnosis not present

## 2020-07-05 DIAGNOSIS — N186 End stage renal disease: Secondary | ICD-10-CM | POA: Diagnosis not present

## 2020-07-06 DIAGNOSIS — N186 End stage renal disease: Secondary | ICD-10-CM | POA: Diagnosis not present

## 2020-07-06 DIAGNOSIS — N2581 Secondary hyperparathyroidism of renal origin: Secondary | ICD-10-CM | POA: Diagnosis not present

## 2020-07-06 DIAGNOSIS — Z992 Dependence on renal dialysis: Secondary | ICD-10-CM | POA: Diagnosis not present

## 2020-07-09 DIAGNOSIS — Z992 Dependence on renal dialysis: Secondary | ICD-10-CM | POA: Diagnosis not present

## 2020-07-09 DIAGNOSIS — N2581 Secondary hyperparathyroidism of renal origin: Secondary | ICD-10-CM | POA: Diagnosis not present

## 2020-07-09 DIAGNOSIS — N186 End stage renal disease: Secondary | ICD-10-CM | POA: Diagnosis not present

## 2020-07-11 DIAGNOSIS — N186 End stage renal disease: Secondary | ICD-10-CM | POA: Diagnosis not present

## 2020-07-11 DIAGNOSIS — N2581 Secondary hyperparathyroidism of renal origin: Secondary | ICD-10-CM | POA: Diagnosis not present

## 2020-07-11 DIAGNOSIS — Z992 Dependence on renal dialysis: Secondary | ICD-10-CM | POA: Diagnosis not present

## 2020-07-13 DIAGNOSIS — N2581 Secondary hyperparathyroidism of renal origin: Secondary | ICD-10-CM | POA: Diagnosis not present

## 2020-07-13 DIAGNOSIS — Z992 Dependence on renal dialysis: Secondary | ICD-10-CM | POA: Diagnosis not present

## 2020-07-13 DIAGNOSIS — N186 End stage renal disease: Secondary | ICD-10-CM | POA: Diagnosis not present

## 2020-07-16 DIAGNOSIS — N2581 Secondary hyperparathyroidism of renal origin: Secondary | ICD-10-CM | POA: Diagnosis not present

## 2020-07-16 DIAGNOSIS — Z992 Dependence on renal dialysis: Secondary | ICD-10-CM | POA: Diagnosis not present

## 2020-07-16 DIAGNOSIS — N186 End stage renal disease: Secondary | ICD-10-CM | POA: Diagnosis not present

## 2020-07-17 DIAGNOSIS — E113592 Type 2 diabetes mellitus with proliferative diabetic retinopathy without macular edema, left eye: Secondary | ICD-10-CM | POA: Diagnosis not present

## 2020-07-18 DIAGNOSIS — N186 End stage renal disease: Secondary | ICD-10-CM | POA: Diagnosis not present

## 2020-07-18 DIAGNOSIS — Z992 Dependence on renal dialysis: Secondary | ICD-10-CM | POA: Diagnosis not present

## 2020-07-18 DIAGNOSIS — N2581 Secondary hyperparathyroidism of renal origin: Secondary | ICD-10-CM | POA: Diagnosis not present

## 2020-07-20 DIAGNOSIS — Z992 Dependence on renal dialysis: Secondary | ICD-10-CM | POA: Diagnosis not present

## 2020-07-20 DIAGNOSIS — N186 End stage renal disease: Secondary | ICD-10-CM | POA: Diagnosis not present

## 2020-07-20 DIAGNOSIS — N2581 Secondary hyperparathyroidism of renal origin: Secondary | ICD-10-CM | POA: Diagnosis not present

## 2020-07-23 DIAGNOSIS — N2581 Secondary hyperparathyroidism of renal origin: Secondary | ICD-10-CM | POA: Diagnosis not present

## 2020-07-23 DIAGNOSIS — N186 End stage renal disease: Secondary | ICD-10-CM | POA: Diagnosis not present

## 2020-07-23 DIAGNOSIS — Z992 Dependence on renal dialysis: Secondary | ICD-10-CM | POA: Diagnosis not present

## 2020-07-25 DIAGNOSIS — N2581 Secondary hyperparathyroidism of renal origin: Secondary | ICD-10-CM | POA: Diagnosis not present

## 2020-07-25 DIAGNOSIS — N186 End stage renal disease: Secondary | ICD-10-CM | POA: Diagnosis not present

## 2020-07-25 DIAGNOSIS — Z992 Dependence on renal dialysis: Secondary | ICD-10-CM | POA: Diagnosis not present

## 2020-07-26 DIAGNOSIS — Z992 Dependence on renal dialysis: Secondary | ICD-10-CM | POA: Diagnosis not present

## 2020-07-26 DIAGNOSIS — I129 Hypertensive chronic kidney disease with stage 1 through stage 4 chronic kidney disease, or unspecified chronic kidney disease: Secondary | ICD-10-CM | POA: Diagnosis not present

## 2020-07-26 DIAGNOSIS — N186 End stage renal disease: Secondary | ICD-10-CM | POA: Diagnosis not present

## 2020-07-27 DIAGNOSIS — Z992 Dependence on renal dialysis: Secondary | ICD-10-CM | POA: Diagnosis not present

## 2020-07-27 DIAGNOSIS — N186 End stage renal disease: Secondary | ICD-10-CM | POA: Diagnosis not present

## 2020-07-27 DIAGNOSIS — N2581 Secondary hyperparathyroidism of renal origin: Secondary | ICD-10-CM | POA: Diagnosis not present

## 2020-07-30 DIAGNOSIS — N186 End stage renal disease: Secondary | ICD-10-CM | POA: Diagnosis not present

## 2020-07-30 DIAGNOSIS — N2581 Secondary hyperparathyroidism of renal origin: Secondary | ICD-10-CM | POA: Diagnosis not present

## 2020-07-30 DIAGNOSIS — Z992 Dependence on renal dialysis: Secondary | ICD-10-CM | POA: Diagnosis not present

## 2020-08-01 DIAGNOSIS — Z992 Dependence on renal dialysis: Secondary | ICD-10-CM | POA: Diagnosis not present

## 2020-08-01 DIAGNOSIS — N186 End stage renal disease: Secondary | ICD-10-CM | POA: Diagnosis not present

## 2020-08-01 DIAGNOSIS — N2581 Secondary hyperparathyroidism of renal origin: Secondary | ICD-10-CM | POA: Diagnosis not present

## 2020-08-03 DIAGNOSIS — N186 End stage renal disease: Secondary | ICD-10-CM | POA: Diagnosis not present

## 2020-08-03 DIAGNOSIS — N2581 Secondary hyperparathyroidism of renal origin: Secondary | ICD-10-CM | POA: Diagnosis not present

## 2020-08-03 DIAGNOSIS — Z992 Dependence on renal dialysis: Secondary | ICD-10-CM | POA: Diagnosis not present

## 2020-08-06 DIAGNOSIS — Z992 Dependence on renal dialysis: Secondary | ICD-10-CM | POA: Diagnosis not present

## 2020-08-06 DIAGNOSIS — N186 End stage renal disease: Secondary | ICD-10-CM | POA: Diagnosis not present

## 2020-08-06 DIAGNOSIS — N2581 Secondary hyperparathyroidism of renal origin: Secondary | ICD-10-CM | POA: Diagnosis not present

## 2020-08-08 DIAGNOSIS — N2581 Secondary hyperparathyroidism of renal origin: Secondary | ICD-10-CM | POA: Diagnosis not present

## 2020-08-08 DIAGNOSIS — N186 End stage renal disease: Secondary | ICD-10-CM | POA: Diagnosis not present

## 2020-08-08 DIAGNOSIS — Z992 Dependence on renal dialysis: Secondary | ICD-10-CM | POA: Diagnosis not present

## 2020-08-09 DIAGNOSIS — E113592 Type 2 diabetes mellitus with proliferative diabetic retinopathy without macular edema, left eye: Secondary | ICD-10-CM | POA: Diagnosis not present

## 2020-08-10 DIAGNOSIS — N186 End stage renal disease: Secondary | ICD-10-CM | POA: Diagnosis not present

## 2020-08-10 DIAGNOSIS — N2581 Secondary hyperparathyroidism of renal origin: Secondary | ICD-10-CM | POA: Diagnosis not present

## 2020-08-10 DIAGNOSIS — Z992 Dependence on renal dialysis: Secondary | ICD-10-CM | POA: Diagnosis not present

## 2020-08-13 DIAGNOSIS — R197 Diarrhea, unspecified: Secondary | ICD-10-CM | POA: Diagnosis not present

## 2020-08-13 DIAGNOSIS — Z992 Dependence on renal dialysis: Secondary | ICD-10-CM | POA: Diagnosis not present

## 2020-08-13 DIAGNOSIS — N2581 Secondary hyperparathyroidism of renal origin: Secondary | ICD-10-CM | POA: Diagnosis not present

## 2020-08-13 DIAGNOSIS — N186 End stage renal disease: Secondary | ICD-10-CM | POA: Diagnosis not present

## 2020-08-15 DIAGNOSIS — N186 End stage renal disease: Secondary | ICD-10-CM | POA: Diagnosis not present

## 2020-08-15 DIAGNOSIS — R197 Diarrhea, unspecified: Secondary | ICD-10-CM | POA: Diagnosis not present

## 2020-08-15 DIAGNOSIS — N2581 Secondary hyperparathyroidism of renal origin: Secondary | ICD-10-CM | POA: Diagnosis not present

## 2020-08-15 DIAGNOSIS — Z992 Dependence on renal dialysis: Secondary | ICD-10-CM | POA: Diagnosis not present

## 2020-08-17 DIAGNOSIS — R197 Diarrhea, unspecified: Secondary | ICD-10-CM | POA: Diagnosis not present

## 2020-08-17 DIAGNOSIS — Z992 Dependence on renal dialysis: Secondary | ICD-10-CM | POA: Diagnosis not present

## 2020-08-17 DIAGNOSIS — N186 End stage renal disease: Secondary | ICD-10-CM | POA: Diagnosis not present

## 2020-08-17 DIAGNOSIS — N2581 Secondary hyperparathyroidism of renal origin: Secondary | ICD-10-CM | POA: Diagnosis not present

## 2020-08-20 DIAGNOSIS — N2581 Secondary hyperparathyroidism of renal origin: Secondary | ICD-10-CM | POA: Diagnosis not present

## 2020-08-20 DIAGNOSIS — N186 End stage renal disease: Secondary | ICD-10-CM | POA: Diagnosis not present

## 2020-08-20 DIAGNOSIS — Z992 Dependence on renal dialysis: Secondary | ICD-10-CM | POA: Diagnosis not present

## 2020-08-22 DIAGNOSIS — N2581 Secondary hyperparathyroidism of renal origin: Secondary | ICD-10-CM | POA: Diagnosis not present

## 2020-08-22 DIAGNOSIS — Z992 Dependence on renal dialysis: Secondary | ICD-10-CM | POA: Diagnosis not present

## 2020-08-22 DIAGNOSIS — N186 End stage renal disease: Secondary | ICD-10-CM | POA: Diagnosis not present

## 2020-08-24 DIAGNOSIS — N2581 Secondary hyperparathyroidism of renal origin: Secondary | ICD-10-CM | POA: Diagnosis not present

## 2020-08-24 DIAGNOSIS — N186 End stage renal disease: Secondary | ICD-10-CM | POA: Diagnosis not present

## 2020-08-24 DIAGNOSIS — Z992 Dependence on renal dialysis: Secondary | ICD-10-CM | POA: Diagnosis not present

## 2020-08-25 DIAGNOSIS — I129 Hypertensive chronic kidney disease with stage 1 through stage 4 chronic kidney disease, or unspecified chronic kidney disease: Secondary | ICD-10-CM | POA: Diagnosis not present

## 2020-08-25 DIAGNOSIS — Z992 Dependence on renal dialysis: Secondary | ICD-10-CM | POA: Diagnosis not present

## 2020-08-25 DIAGNOSIS — N186 End stage renal disease: Secondary | ICD-10-CM | POA: Diagnosis not present

## 2020-08-27 DIAGNOSIS — N2581 Secondary hyperparathyroidism of renal origin: Secondary | ICD-10-CM | POA: Diagnosis not present

## 2020-08-27 DIAGNOSIS — N186 End stage renal disease: Secondary | ICD-10-CM | POA: Diagnosis not present

## 2020-08-27 DIAGNOSIS — Z992 Dependence on renal dialysis: Secondary | ICD-10-CM | POA: Diagnosis not present

## 2020-08-29 DIAGNOSIS — N186 End stage renal disease: Secondary | ICD-10-CM | POA: Diagnosis not present

## 2020-08-29 DIAGNOSIS — Z992 Dependence on renal dialysis: Secondary | ICD-10-CM | POA: Diagnosis not present

## 2020-08-29 DIAGNOSIS — N2581 Secondary hyperparathyroidism of renal origin: Secondary | ICD-10-CM | POA: Diagnosis not present

## 2020-08-31 DIAGNOSIS — Z992 Dependence on renal dialysis: Secondary | ICD-10-CM | POA: Diagnosis not present

## 2020-08-31 DIAGNOSIS — N2581 Secondary hyperparathyroidism of renal origin: Secondary | ICD-10-CM | POA: Diagnosis not present

## 2020-08-31 DIAGNOSIS — N186 End stage renal disease: Secondary | ICD-10-CM | POA: Diagnosis not present

## 2020-09-03 DIAGNOSIS — N186 End stage renal disease: Secondary | ICD-10-CM | POA: Diagnosis not present

## 2020-09-03 DIAGNOSIS — Z992 Dependence on renal dialysis: Secondary | ICD-10-CM | POA: Diagnosis not present

## 2020-09-03 DIAGNOSIS — N2581 Secondary hyperparathyroidism of renal origin: Secondary | ICD-10-CM | POA: Diagnosis not present

## 2020-09-05 DIAGNOSIS — Z992 Dependence on renal dialysis: Secondary | ICD-10-CM | POA: Diagnosis not present

## 2020-09-05 DIAGNOSIS — N186 End stage renal disease: Secondary | ICD-10-CM | POA: Diagnosis not present

## 2020-09-05 DIAGNOSIS — N2581 Secondary hyperparathyroidism of renal origin: Secondary | ICD-10-CM | POA: Diagnosis not present

## 2020-09-06 DIAGNOSIS — Z1159 Encounter for screening for other viral diseases: Secondary | ICD-10-CM | POA: Diagnosis not present

## 2020-09-06 DIAGNOSIS — Z114 Encounter for screening for human immunodeficiency virus [HIV]: Secondary | ICD-10-CM | POA: Diagnosis not present

## 2020-09-06 DIAGNOSIS — E1122 Type 2 diabetes mellitus with diabetic chronic kidney disease: Secondary | ICD-10-CM | POA: Diagnosis not present

## 2020-09-06 DIAGNOSIS — Z111 Encounter for screening for respiratory tuberculosis: Secondary | ICD-10-CM | POA: Diagnosis not present

## 2020-09-06 DIAGNOSIS — Z01818 Encounter for other preprocedural examination: Secondary | ICD-10-CM | POA: Diagnosis not present

## 2020-09-06 DIAGNOSIS — Z87891 Personal history of nicotine dependence: Secondary | ICD-10-CM | POA: Diagnosis not present

## 2020-09-06 DIAGNOSIS — N186 End stage renal disease: Secondary | ICD-10-CM | POA: Diagnosis not present

## 2020-09-06 DIAGNOSIS — Z7682 Awaiting organ transplant status: Secondary | ICD-10-CM | POA: Diagnosis not present

## 2020-09-06 DIAGNOSIS — R9431 Abnormal electrocardiogram [ECG] [EKG]: Secondary | ICD-10-CM | POA: Diagnosis not present

## 2020-09-06 DIAGNOSIS — K439 Ventral hernia without obstruction or gangrene: Secondary | ICD-10-CM | POA: Diagnosis not present

## 2020-09-06 DIAGNOSIS — Z992 Dependence on renal dialysis: Secondary | ICD-10-CM | POA: Diagnosis not present

## 2020-09-06 DIAGNOSIS — M47814 Spondylosis without myelopathy or radiculopathy, thoracic region: Secondary | ICD-10-CM | POA: Diagnosis not present

## 2020-09-06 DIAGNOSIS — Z79899 Other long term (current) drug therapy: Secondary | ICD-10-CM | POA: Diagnosis not present

## 2020-09-06 DIAGNOSIS — J9811 Atelectasis: Secondary | ICD-10-CM | POA: Diagnosis not present

## 2020-09-06 DIAGNOSIS — M40204 Unspecified kyphosis, thoracic region: Secondary | ICD-10-CM | POA: Diagnosis not present

## 2020-09-07 DIAGNOSIS — Z992 Dependence on renal dialysis: Secondary | ICD-10-CM | POA: Diagnosis not present

## 2020-09-07 DIAGNOSIS — N186 End stage renal disease: Secondary | ICD-10-CM | POA: Diagnosis not present

## 2020-09-07 DIAGNOSIS — N2581 Secondary hyperparathyroidism of renal origin: Secondary | ICD-10-CM | POA: Diagnosis not present

## 2020-09-10 DIAGNOSIS — Z992 Dependence on renal dialysis: Secondary | ICD-10-CM | POA: Diagnosis not present

## 2020-09-10 DIAGNOSIS — N186 End stage renal disease: Secondary | ICD-10-CM | POA: Diagnosis not present

## 2020-09-10 DIAGNOSIS — N2581 Secondary hyperparathyroidism of renal origin: Secondary | ICD-10-CM | POA: Diagnosis not present

## 2020-09-11 DIAGNOSIS — H35353 Cystoid macular degeneration, bilateral: Secondary | ICD-10-CM | POA: Diagnosis not present

## 2020-09-12 DIAGNOSIS — N2581 Secondary hyperparathyroidism of renal origin: Secondary | ICD-10-CM | POA: Diagnosis not present

## 2020-09-12 DIAGNOSIS — N186 End stage renal disease: Secondary | ICD-10-CM | POA: Diagnosis not present

## 2020-09-12 DIAGNOSIS — Z992 Dependence on renal dialysis: Secondary | ICD-10-CM | POA: Diagnosis not present

## 2020-09-14 DIAGNOSIS — N2581 Secondary hyperparathyroidism of renal origin: Secondary | ICD-10-CM | POA: Diagnosis not present

## 2020-09-14 DIAGNOSIS — Z992 Dependence on renal dialysis: Secondary | ICD-10-CM | POA: Diagnosis not present

## 2020-09-14 DIAGNOSIS — N186 End stage renal disease: Secondary | ICD-10-CM | POA: Diagnosis not present

## 2020-09-17 DIAGNOSIS — N2581 Secondary hyperparathyroidism of renal origin: Secondary | ICD-10-CM | POA: Diagnosis not present

## 2020-09-17 DIAGNOSIS — N186 End stage renal disease: Secondary | ICD-10-CM | POA: Diagnosis not present

## 2020-09-17 DIAGNOSIS — Z992 Dependence on renal dialysis: Secondary | ICD-10-CM | POA: Diagnosis not present

## 2020-09-19 DIAGNOSIS — N186 End stage renal disease: Secondary | ICD-10-CM | POA: Diagnosis not present

## 2020-09-19 DIAGNOSIS — N2581 Secondary hyperparathyroidism of renal origin: Secondary | ICD-10-CM | POA: Diagnosis not present

## 2020-09-19 DIAGNOSIS — Z992 Dependence on renal dialysis: Secondary | ICD-10-CM | POA: Diagnosis not present

## 2020-09-21 DIAGNOSIS — N186 End stage renal disease: Secondary | ICD-10-CM | POA: Diagnosis not present

## 2020-09-21 DIAGNOSIS — Z992 Dependence on renal dialysis: Secondary | ICD-10-CM | POA: Diagnosis not present

## 2020-09-21 DIAGNOSIS — N2581 Secondary hyperparathyroidism of renal origin: Secondary | ICD-10-CM | POA: Diagnosis not present

## 2020-09-24 DIAGNOSIS — Z992 Dependence on renal dialysis: Secondary | ICD-10-CM | POA: Diagnosis not present

## 2020-09-24 DIAGNOSIS — R197 Diarrhea, unspecified: Secondary | ICD-10-CM | POA: Diagnosis not present

## 2020-09-24 DIAGNOSIS — N2581 Secondary hyperparathyroidism of renal origin: Secondary | ICD-10-CM | POA: Diagnosis not present

## 2020-09-24 DIAGNOSIS — N186 End stage renal disease: Secondary | ICD-10-CM | POA: Diagnosis not present

## 2020-09-25 DIAGNOSIS — I129 Hypertensive chronic kidney disease with stage 1 through stage 4 chronic kidney disease, or unspecified chronic kidney disease: Secondary | ICD-10-CM | POA: Diagnosis not present

## 2020-09-25 DIAGNOSIS — N186 End stage renal disease: Secondary | ICD-10-CM | POA: Diagnosis not present

## 2020-09-25 DIAGNOSIS — Z992 Dependence on renal dialysis: Secondary | ICD-10-CM | POA: Diagnosis not present

## 2020-09-25 DIAGNOSIS — Z1231 Encounter for screening mammogram for malignant neoplasm of breast: Secondary | ICD-10-CM | POA: Diagnosis not present

## 2020-09-28 DIAGNOSIS — N2581 Secondary hyperparathyroidism of renal origin: Secondary | ICD-10-CM | POA: Diagnosis not present

## 2020-09-28 DIAGNOSIS — N186 End stage renal disease: Secondary | ICD-10-CM | POA: Diagnosis not present

## 2020-09-28 DIAGNOSIS — Z992 Dependence on renal dialysis: Secondary | ICD-10-CM | POA: Diagnosis not present

## 2020-10-01 DIAGNOSIS — N2581 Secondary hyperparathyroidism of renal origin: Secondary | ICD-10-CM | POA: Diagnosis not present

## 2020-10-01 DIAGNOSIS — N186 End stage renal disease: Secondary | ICD-10-CM | POA: Diagnosis not present

## 2020-10-01 DIAGNOSIS — Z992 Dependence on renal dialysis: Secondary | ICD-10-CM | POA: Diagnosis not present

## 2020-10-03 DIAGNOSIS — Z992 Dependence on renal dialysis: Secondary | ICD-10-CM | POA: Diagnosis not present

## 2020-10-03 DIAGNOSIS — N2581 Secondary hyperparathyroidism of renal origin: Secondary | ICD-10-CM | POA: Diagnosis not present

## 2020-10-03 DIAGNOSIS — N186 End stage renal disease: Secondary | ICD-10-CM | POA: Diagnosis not present

## 2020-10-05 DIAGNOSIS — N186 End stage renal disease: Secondary | ICD-10-CM | POA: Diagnosis not present

## 2020-10-05 DIAGNOSIS — N2581 Secondary hyperparathyroidism of renal origin: Secondary | ICD-10-CM | POA: Diagnosis not present

## 2020-10-05 DIAGNOSIS — Z992 Dependence on renal dialysis: Secondary | ICD-10-CM | POA: Diagnosis not present

## 2020-10-08 DIAGNOSIS — N186 End stage renal disease: Secondary | ICD-10-CM | POA: Diagnosis not present

## 2020-10-08 DIAGNOSIS — N2581 Secondary hyperparathyroidism of renal origin: Secondary | ICD-10-CM | POA: Diagnosis not present

## 2020-10-08 DIAGNOSIS — Z992 Dependence on renal dialysis: Secondary | ICD-10-CM | POA: Diagnosis not present

## 2020-10-10 DIAGNOSIS — Z992 Dependence on renal dialysis: Secondary | ICD-10-CM | POA: Diagnosis not present

## 2020-10-10 DIAGNOSIS — N2581 Secondary hyperparathyroidism of renal origin: Secondary | ICD-10-CM | POA: Diagnosis not present

## 2020-10-10 DIAGNOSIS — N186 End stage renal disease: Secondary | ICD-10-CM | POA: Diagnosis not present

## 2020-10-12 DIAGNOSIS — N2581 Secondary hyperparathyroidism of renal origin: Secondary | ICD-10-CM | POA: Diagnosis not present

## 2020-10-12 DIAGNOSIS — N186 End stage renal disease: Secondary | ICD-10-CM | POA: Diagnosis not present

## 2020-10-12 DIAGNOSIS — Z992 Dependence on renal dialysis: Secondary | ICD-10-CM | POA: Diagnosis not present

## 2020-10-15 DIAGNOSIS — N186 End stage renal disease: Secondary | ICD-10-CM | POA: Diagnosis not present

## 2020-10-15 DIAGNOSIS — Z992 Dependence on renal dialysis: Secondary | ICD-10-CM | POA: Diagnosis not present

## 2020-10-15 DIAGNOSIS — N2581 Secondary hyperparathyroidism of renal origin: Secondary | ICD-10-CM | POA: Diagnosis not present

## 2020-10-17 DIAGNOSIS — N186 End stage renal disease: Secondary | ICD-10-CM | POA: Diagnosis not present

## 2020-10-17 DIAGNOSIS — N2581 Secondary hyperparathyroidism of renal origin: Secondary | ICD-10-CM | POA: Diagnosis not present

## 2020-10-17 DIAGNOSIS — Z992 Dependence on renal dialysis: Secondary | ICD-10-CM | POA: Diagnosis not present

## 2020-10-19 DIAGNOSIS — Z992 Dependence on renal dialysis: Secondary | ICD-10-CM | POA: Diagnosis not present

## 2020-10-19 DIAGNOSIS — N186 End stage renal disease: Secondary | ICD-10-CM | POA: Diagnosis not present

## 2020-10-19 DIAGNOSIS — N2581 Secondary hyperparathyroidism of renal origin: Secondary | ICD-10-CM | POA: Diagnosis not present

## 2020-10-22 DIAGNOSIS — N186 End stage renal disease: Secondary | ICD-10-CM | POA: Diagnosis not present

## 2020-10-22 DIAGNOSIS — Z992 Dependence on renal dialysis: Secondary | ICD-10-CM | POA: Diagnosis not present

## 2020-10-22 DIAGNOSIS — N2581 Secondary hyperparathyroidism of renal origin: Secondary | ICD-10-CM | POA: Diagnosis not present

## 2020-10-24 DIAGNOSIS — N2581 Secondary hyperparathyroidism of renal origin: Secondary | ICD-10-CM | POA: Diagnosis not present

## 2020-10-24 DIAGNOSIS — N186 End stage renal disease: Secondary | ICD-10-CM | POA: Diagnosis not present

## 2020-10-24 DIAGNOSIS — Z992 Dependence on renal dialysis: Secondary | ICD-10-CM | POA: Diagnosis not present

## 2020-10-25 DIAGNOSIS — Z992 Dependence on renal dialysis: Secondary | ICD-10-CM | POA: Diagnosis not present

## 2020-10-25 DIAGNOSIS — E785 Hyperlipidemia, unspecified: Secondary | ICD-10-CM | POA: Diagnosis not present

## 2020-10-25 DIAGNOSIS — Z01818 Encounter for other preprocedural examination: Secondary | ICD-10-CM | POA: Diagnosis not present

## 2020-10-25 DIAGNOSIS — I129 Hypertensive chronic kidney disease with stage 1 through stage 4 chronic kidney disease, or unspecified chronic kidney disease: Secondary | ICD-10-CM | POA: Diagnosis not present

## 2020-10-25 DIAGNOSIS — Z124 Encounter for screening for malignant neoplasm of cervix: Secondary | ICD-10-CM | POA: Diagnosis not present

## 2020-10-25 DIAGNOSIS — I10 Essential (primary) hypertension: Secondary | ICD-10-CM | POA: Diagnosis not present

## 2020-10-25 DIAGNOSIS — N186 End stage renal disease: Secondary | ICD-10-CM | POA: Diagnosis not present

## 2020-10-26 DIAGNOSIS — N186 End stage renal disease: Secondary | ICD-10-CM | POA: Diagnosis not present

## 2020-10-26 DIAGNOSIS — N2581 Secondary hyperparathyroidism of renal origin: Secondary | ICD-10-CM | POA: Diagnosis not present

## 2020-10-26 DIAGNOSIS — Z992 Dependence on renal dialysis: Secondary | ICD-10-CM | POA: Diagnosis not present

## 2020-10-29 DIAGNOSIS — Z992 Dependence on renal dialysis: Secondary | ICD-10-CM | POA: Diagnosis not present

## 2020-10-29 DIAGNOSIS — N186 End stage renal disease: Secondary | ICD-10-CM | POA: Diagnosis not present

## 2020-10-29 DIAGNOSIS — N2581 Secondary hyperparathyroidism of renal origin: Secondary | ICD-10-CM | POA: Diagnosis not present

## 2020-10-29 DIAGNOSIS — R197 Diarrhea, unspecified: Secondary | ICD-10-CM | POA: Diagnosis not present

## 2020-10-31 DIAGNOSIS — N2581 Secondary hyperparathyroidism of renal origin: Secondary | ICD-10-CM | POA: Diagnosis not present

## 2020-10-31 DIAGNOSIS — Z992 Dependence on renal dialysis: Secondary | ICD-10-CM | POA: Diagnosis not present

## 2020-10-31 DIAGNOSIS — N186 End stage renal disease: Secondary | ICD-10-CM | POA: Diagnosis not present

## 2020-10-31 DIAGNOSIS — R197 Diarrhea, unspecified: Secondary | ICD-10-CM | POA: Diagnosis not present

## 2020-11-02 DIAGNOSIS — N186 End stage renal disease: Secondary | ICD-10-CM | POA: Diagnosis not present

## 2020-11-02 DIAGNOSIS — Z992 Dependence on renal dialysis: Secondary | ICD-10-CM | POA: Diagnosis not present

## 2020-11-02 DIAGNOSIS — R197 Diarrhea, unspecified: Secondary | ICD-10-CM | POA: Diagnosis not present

## 2020-11-02 DIAGNOSIS — N2581 Secondary hyperparathyroidism of renal origin: Secondary | ICD-10-CM | POA: Diagnosis not present

## 2020-11-05 DIAGNOSIS — Z992 Dependence on renal dialysis: Secondary | ICD-10-CM | POA: Diagnosis not present

## 2020-11-05 DIAGNOSIS — N186 End stage renal disease: Secondary | ICD-10-CM | POA: Diagnosis not present

## 2020-11-05 DIAGNOSIS — N2581 Secondary hyperparathyroidism of renal origin: Secondary | ICD-10-CM | POA: Diagnosis not present

## 2020-11-07 DIAGNOSIS — N2581 Secondary hyperparathyroidism of renal origin: Secondary | ICD-10-CM | POA: Diagnosis not present

## 2020-11-07 DIAGNOSIS — Z992 Dependence on renal dialysis: Secondary | ICD-10-CM | POA: Diagnosis not present

## 2020-11-07 DIAGNOSIS — N186 End stage renal disease: Secondary | ICD-10-CM | POA: Diagnosis not present

## 2020-11-09 DIAGNOSIS — N186 End stage renal disease: Secondary | ICD-10-CM | POA: Diagnosis not present

## 2020-11-09 DIAGNOSIS — Z992 Dependence on renal dialysis: Secondary | ICD-10-CM | POA: Diagnosis not present

## 2020-11-09 DIAGNOSIS — N2581 Secondary hyperparathyroidism of renal origin: Secondary | ICD-10-CM | POA: Diagnosis not present

## 2020-11-12 DIAGNOSIS — Z992 Dependence on renal dialysis: Secondary | ICD-10-CM | POA: Diagnosis not present

## 2020-11-12 DIAGNOSIS — N186 End stage renal disease: Secondary | ICD-10-CM | POA: Diagnosis not present

## 2020-11-12 DIAGNOSIS — N2581 Secondary hyperparathyroidism of renal origin: Secondary | ICD-10-CM | POA: Diagnosis not present

## 2020-11-13 DIAGNOSIS — Z01810 Encounter for preprocedural cardiovascular examination: Secondary | ICD-10-CM | POA: Diagnosis not present

## 2020-11-13 DIAGNOSIS — Z01818 Encounter for other preprocedural examination: Secondary | ICD-10-CM | POA: Diagnosis not present

## 2020-11-14 DIAGNOSIS — N2581 Secondary hyperparathyroidism of renal origin: Secondary | ICD-10-CM | POA: Diagnosis not present

## 2020-11-14 DIAGNOSIS — Z992 Dependence on renal dialysis: Secondary | ICD-10-CM | POA: Diagnosis not present

## 2020-11-14 DIAGNOSIS — N186 End stage renal disease: Secondary | ICD-10-CM | POA: Diagnosis not present

## 2020-11-16 DIAGNOSIS — N2581 Secondary hyperparathyroidism of renal origin: Secondary | ICD-10-CM | POA: Diagnosis not present

## 2020-11-16 DIAGNOSIS — N186 End stage renal disease: Secondary | ICD-10-CM | POA: Diagnosis not present

## 2020-11-16 DIAGNOSIS — Z992 Dependence on renal dialysis: Secondary | ICD-10-CM | POA: Diagnosis not present

## 2020-11-19 DIAGNOSIS — N2581 Secondary hyperparathyroidism of renal origin: Secondary | ICD-10-CM | POA: Diagnosis not present

## 2020-11-19 DIAGNOSIS — Z992 Dependence on renal dialysis: Secondary | ICD-10-CM | POA: Diagnosis not present

## 2020-11-19 DIAGNOSIS — N186 End stage renal disease: Secondary | ICD-10-CM | POA: Diagnosis not present

## 2020-11-21 DIAGNOSIS — N2581 Secondary hyperparathyroidism of renal origin: Secondary | ICD-10-CM | POA: Diagnosis not present

## 2020-11-21 DIAGNOSIS — N186 End stage renal disease: Secondary | ICD-10-CM | POA: Diagnosis not present

## 2020-11-21 DIAGNOSIS — Z992 Dependence on renal dialysis: Secondary | ICD-10-CM | POA: Diagnosis not present

## 2020-11-23 DIAGNOSIS — N186 End stage renal disease: Secondary | ICD-10-CM | POA: Diagnosis not present

## 2020-11-23 DIAGNOSIS — Z992 Dependence on renal dialysis: Secondary | ICD-10-CM | POA: Diagnosis not present

## 2020-11-23 DIAGNOSIS — N2581 Secondary hyperparathyroidism of renal origin: Secondary | ICD-10-CM | POA: Diagnosis not present

## 2020-11-25 DIAGNOSIS — I129 Hypertensive chronic kidney disease with stage 1 through stage 4 chronic kidney disease, or unspecified chronic kidney disease: Secondary | ICD-10-CM | POA: Diagnosis not present

## 2020-11-25 DIAGNOSIS — Z992 Dependence on renal dialysis: Secondary | ICD-10-CM | POA: Diagnosis not present

## 2020-11-25 DIAGNOSIS — N186 End stage renal disease: Secondary | ICD-10-CM | POA: Diagnosis not present

## 2020-11-26 DIAGNOSIS — N186 End stage renal disease: Secondary | ICD-10-CM | POA: Diagnosis not present

## 2020-11-26 DIAGNOSIS — N2581 Secondary hyperparathyroidism of renal origin: Secondary | ICD-10-CM | POA: Diagnosis not present

## 2020-11-26 DIAGNOSIS — Z992 Dependence on renal dialysis: Secondary | ICD-10-CM | POA: Diagnosis not present

## 2020-11-28 DIAGNOSIS — N186 End stage renal disease: Secondary | ICD-10-CM | POA: Diagnosis not present

## 2020-11-28 DIAGNOSIS — Z992 Dependence on renal dialysis: Secondary | ICD-10-CM | POA: Diagnosis not present

## 2020-11-28 DIAGNOSIS — N2581 Secondary hyperparathyroidism of renal origin: Secondary | ICD-10-CM | POA: Diagnosis not present

## 2020-11-30 DIAGNOSIS — Z992 Dependence on renal dialysis: Secondary | ICD-10-CM | POA: Diagnosis not present

## 2020-11-30 DIAGNOSIS — N2581 Secondary hyperparathyroidism of renal origin: Secondary | ICD-10-CM | POA: Diagnosis not present

## 2020-11-30 DIAGNOSIS — N186 End stage renal disease: Secondary | ICD-10-CM | POA: Diagnosis not present

## 2020-12-03 DIAGNOSIS — N186 End stage renal disease: Secondary | ICD-10-CM | POA: Diagnosis not present

## 2020-12-03 DIAGNOSIS — R197 Diarrhea, unspecified: Secondary | ICD-10-CM | POA: Diagnosis not present

## 2020-12-03 DIAGNOSIS — N2581 Secondary hyperparathyroidism of renal origin: Secondary | ICD-10-CM | POA: Diagnosis not present

## 2020-12-03 DIAGNOSIS — Z992 Dependence on renal dialysis: Secondary | ICD-10-CM | POA: Diagnosis not present

## 2020-12-05 DIAGNOSIS — R197 Diarrhea, unspecified: Secondary | ICD-10-CM | POA: Diagnosis not present

## 2020-12-05 DIAGNOSIS — Z992 Dependence on renal dialysis: Secondary | ICD-10-CM | POA: Diagnosis not present

## 2020-12-05 DIAGNOSIS — N186 End stage renal disease: Secondary | ICD-10-CM | POA: Diagnosis not present

## 2020-12-05 DIAGNOSIS — N2581 Secondary hyperparathyroidism of renal origin: Secondary | ICD-10-CM | POA: Diagnosis not present

## 2020-12-07 DIAGNOSIS — N2581 Secondary hyperparathyroidism of renal origin: Secondary | ICD-10-CM | POA: Diagnosis not present

## 2020-12-07 DIAGNOSIS — N186 End stage renal disease: Secondary | ICD-10-CM | POA: Diagnosis not present

## 2020-12-07 DIAGNOSIS — Z992 Dependence on renal dialysis: Secondary | ICD-10-CM | POA: Diagnosis not present

## 2020-12-07 DIAGNOSIS — R197 Diarrhea, unspecified: Secondary | ICD-10-CM | POA: Diagnosis not present

## 2020-12-10 DIAGNOSIS — Z992 Dependence on renal dialysis: Secondary | ICD-10-CM | POA: Diagnosis not present

## 2020-12-10 DIAGNOSIS — N2581 Secondary hyperparathyroidism of renal origin: Secondary | ICD-10-CM | POA: Diagnosis not present

## 2020-12-10 DIAGNOSIS — N186 End stage renal disease: Secondary | ICD-10-CM | POA: Diagnosis not present

## 2020-12-11 DIAGNOSIS — E113593 Type 2 diabetes mellitus with proliferative diabetic retinopathy without macular edema, bilateral: Secondary | ICD-10-CM | POA: Diagnosis not present

## 2020-12-12 DIAGNOSIS — N2581 Secondary hyperparathyroidism of renal origin: Secondary | ICD-10-CM | POA: Diagnosis not present

## 2020-12-12 DIAGNOSIS — Z992 Dependence on renal dialysis: Secondary | ICD-10-CM | POA: Diagnosis not present

## 2020-12-12 DIAGNOSIS — N186 End stage renal disease: Secondary | ICD-10-CM | POA: Diagnosis not present

## 2020-12-14 DIAGNOSIS — Z992 Dependence on renal dialysis: Secondary | ICD-10-CM | POA: Diagnosis not present

## 2020-12-14 DIAGNOSIS — N2581 Secondary hyperparathyroidism of renal origin: Secondary | ICD-10-CM | POA: Diagnosis not present

## 2020-12-14 DIAGNOSIS — N186 End stage renal disease: Secondary | ICD-10-CM | POA: Diagnosis not present

## 2020-12-17 DIAGNOSIS — N186 End stage renal disease: Secondary | ICD-10-CM | POA: Diagnosis not present

## 2020-12-17 DIAGNOSIS — N2581 Secondary hyperparathyroidism of renal origin: Secondary | ICD-10-CM | POA: Diagnosis not present

## 2020-12-17 DIAGNOSIS — Z992 Dependence on renal dialysis: Secondary | ICD-10-CM | POA: Diagnosis not present

## 2020-12-19 DIAGNOSIS — Z992 Dependence on renal dialysis: Secondary | ICD-10-CM | POA: Diagnosis not present

## 2020-12-19 DIAGNOSIS — N2581 Secondary hyperparathyroidism of renal origin: Secondary | ICD-10-CM | POA: Diagnosis not present

## 2020-12-19 DIAGNOSIS — N186 End stage renal disease: Secondary | ICD-10-CM | POA: Diagnosis not present

## 2020-12-21 DIAGNOSIS — Z992 Dependence on renal dialysis: Secondary | ICD-10-CM | POA: Diagnosis not present

## 2020-12-21 DIAGNOSIS — N186 End stage renal disease: Secondary | ICD-10-CM | POA: Diagnosis not present

## 2020-12-21 DIAGNOSIS — N2581 Secondary hyperparathyroidism of renal origin: Secondary | ICD-10-CM | POA: Diagnosis not present

## 2020-12-24 DIAGNOSIS — Z992 Dependence on renal dialysis: Secondary | ICD-10-CM | POA: Diagnosis not present

## 2020-12-24 DIAGNOSIS — N2581 Secondary hyperparathyroidism of renal origin: Secondary | ICD-10-CM | POA: Diagnosis not present

## 2020-12-24 DIAGNOSIS — N186 End stage renal disease: Secondary | ICD-10-CM | POA: Diagnosis not present

## 2020-12-26 DIAGNOSIS — I129 Hypertensive chronic kidney disease with stage 1 through stage 4 chronic kidney disease, or unspecified chronic kidney disease: Secondary | ICD-10-CM | POA: Diagnosis not present

## 2020-12-26 DIAGNOSIS — Z992 Dependence on renal dialysis: Secondary | ICD-10-CM | POA: Diagnosis not present

## 2020-12-26 DIAGNOSIS — N186 End stage renal disease: Secondary | ICD-10-CM | POA: Diagnosis not present

## 2020-12-26 DIAGNOSIS — N2581 Secondary hyperparathyroidism of renal origin: Secondary | ICD-10-CM | POA: Diagnosis not present

## 2020-12-29 DIAGNOSIS — N186 End stage renal disease: Secondary | ICD-10-CM | POA: Diagnosis not present

## 2020-12-29 DIAGNOSIS — N2581 Secondary hyperparathyroidism of renal origin: Secondary | ICD-10-CM | POA: Diagnosis not present

## 2020-12-29 DIAGNOSIS — Z992 Dependence on renal dialysis: Secondary | ICD-10-CM | POA: Diagnosis not present

## 2020-12-31 DIAGNOSIS — N2581 Secondary hyperparathyroidism of renal origin: Secondary | ICD-10-CM | POA: Diagnosis not present

## 2020-12-31 DIAGNOSIS — Z992 Dependence on renal dialysis: Secondary | ICD-10-CM | POA: Diagnosis not present

## 2020-12-31 DIAGNOSIS — R52 Pain, unspecified: Secondary | ICD-10-CM | POA: Diagnosis not present

## 2020-12-31 DIAGNOSIS — N186 End stage renal disease: Secondary | ICD-10-CM | POA: Diagnosis not present

## 2021-01-03 DIAGNOSIS — Z992 Dependence on renal dialysis: Secondary | ICD-10-CM | POA: Diagnosis not present

## 2021-01-03 DIAGNOSIS — J329 Chronic sinusitis, unspecified: Secondary | ICD-10-CM | POA: Diagnosis not present

## 2021-01-03 DIAGNOSIS — B373 Candidiasis of vulva and vagina: Secondary | ICD-10-CM | POA: Diagnosis not present

## 2021-01-03 DIAGNOSIS — R591 Generalized enlarged lymph nodes: Secondary | ICD-10-CM | POA: Diagnosis not present

## 2021-01-04 DIAGNOSIS — N186 End stage renal disease: Secondary | ICD-10-CM | POA: Diagnosis not present

## 2021-01-04 DIAGNOSIS — R0981 Nasal congestion: Secondary | ICD-10-CM | POA: Diagnosis not present

## 2021-01-04 DIAGNOSIS — N2581 Secondary hyperparathyroidism of renal origin: Secondary | ICD-10-CM | POA: Diagnosis not present

## 2021-01-04 DIAGNOSIS — R52 Pain, unspecified: Secondary | ICD-10-CM | POA: Diagnosis not present

## 2021-01-04 DIAGNOSIS — Z992 Dependence on renal dialysis: Secondary | ICD-10-CM | POA: Diagnosis not present

## 2021-01-05 DIAGNOSIS — N2581 Secondary hyperparathyroidism of renal origin: Secondary | ICD-10-CM | POA: Diagnosis not present

## 2021-01-05 DIAGNOSIS — N186 End stage renal disease: Secondary | ICD-10-CM | POA: Diagnosis not present

## 2021-01-05 DIAGNOSIS — Z992 Dependence on renal dialysis: Secondary | ICD-10-CM | POA: Diagnosis not present

## 2021-01-05 DIAGNOSIS — R52 Pain, unspecified: Secondary | ICD-10-CM | POA: Diagnosis not present

## 2021-01-07 DIAGNOSIS — N2581 Secondary hyperparathyroidism of renal origin: Secondary | ICD-10-CM | POA: Diagnosis not present

## 2021-01-07 DIAGNOSIS — Z992 Dependence on renal dialysis: Secondary | ICD-10-CM | POA: Diagnosis not present

## 2021-01-07 DIAGNOSIS — N186 End stage renal disease: Secondary | ICD-10-CM | POA: Diagnosis not present

## 2021-01-09 DIAGNOSIS — N186 End stage renal disease: Secondary | ICD-10-CM | POA: Diagnosis not present

## 2021-01-09 DIAGNOSIS — Z992 Dependence on renal dialysis: Secondary | ICD-10-CM | POA: Diagnosis not present

## 2021-01-09 DIAGNOSIS — N2581 Secondary hyperparathyroidism of renal origin: Secondary | ICD-10-CM | POA: Diagnosis not present

## 2021-01-11 DIAGNOSIS — N186 End stage renal disease: Secondary | ICD-10-CM | POA: Diagnosis not present

## 2021-01-11 DIAGNOSIS — Z992 Dependence on renal dialysis: Secondary | ICD-10-CM | POA: Diagnosis not present

## 2021-01-11 DIAGNOSIS — N2581 Secondary hyperparathyroidism of renal origin: Secondary | ICD-10-CM | POA: Diagnosis not present

## 2021-01-14 DIAGNOSIS — N186 End stage renal disease: Secondary | ICD-10-CM | POA: Diagnosis not present

## 2021-01-14 DIAGNOSIS — N2581 Secondary hyperparathyroidism of renal origin: Secondary | ICD-10-CM | POA: Diagnosis not present

## 2021-01-14 DIAGNOSIS — Z992 Dependence on renal dialysis: Secondary | ICD-10-CM | POA: Diagnosis not present

## 2021-01-14 DIAGNOSIS — Z23 Encounter for immunization: Secondary | ICD-10-CM | POA: Diagnosis not present

## 2021-01-16 DIAGNOSIS — N2581 Secondary hyperparathyroidism of renal origin: Secondary | ICD-10-CM | POA: Diagnosis not present

## 2021-01-16 DIAGNOSIS — Z992 Dependence on renal dialysis: Secondary | ICD-10-CM | POA: Diagnosis not present

## 2021-01-16 DIAGNOSIS — Z23 Encounter for immunization: Secondary | ICD-10-CM | POA: Diagnosis not present

## 2021-01-16 DIAGNOSIS — N186 End stage renal disease: Secondary | ICD-10-CM | POA: Diagnosis not present

## 2021-01-18 DIAGNOSIS — Z23 Encounter for immunization: Secondary | ICD-10-CM | POA: Diagnosis not present

## 2021-01-18 DIAGNOSIS — Z992 Dependence on renal dialysis: Secondary | ICD-10-CM | POA: Diagnosis not present

## 2021-01-18 DIAGNOSIS — N2581 Secondary hyperparathyroidism of renal origin: Secondary | ICD-10-CM | POA: Diagnosis not present

## 2021-01-18 DIAGNOSIS — N186 End stage renal disease: Secondary | ICD-10-CM | POA: Diagnosis not present

## 2021-01-21 DIAGNOSIS — Z992 Dependence on renal dialysis: Secondary | ICD-10-CM | POA: Diagnosis not present

## 2021-01-21 DIAGNOSIS — N186 End stage renal disease: Secondary | ICD-10-CM | POA: Diagnosis not present

## 2021-01-21 DIAGNOSIS — N2581 Secondary hyperparathyroidism of renal origin: Secondary | ICD-10-CM | POA: Diagnosis not present

## 2021-01-23 DIAGNOSIS — N2581 Secondary hyperparathyroidism of renal origin: Secondary | ICD-10-CM | POA: Diagnosis not present

## 2021-01-23 DIAGNOSIS — Z992 Dependence on renal dialysis: Secondary | ICD-10-CM | POA: Diagnosis not present

## 2021-01-23 DIAGNOSIS — N186 End stage renal disease: Secondary | ICD-10-CM | POA: Diagnosis not present

## 2021-01-25 DIAGNOSIS — N186 End stage renal disease: Secondary | ICD-10-CM | POA: Diagnosis not present

## 2021-01-25 DIAGNOSIS — Z992 Dependence on renal dialysis: Secondary | ICD-10-CM | POA: Diagnosis not present

## 2021-01-25 DIAGNOSIS — N2581 Secondary hyperparathyroidism of renal origin: Secondary | ICD-10-CM | POA: Diagnosis not present

## 2021-01-25 DIAGNOSIS — I129 Hypertensive chronic kidney disease with stage 1 through stage 4 chronic kidney disease, or unspecified chronic kidney disease: Secondary | ICD-10-CM | POA: Diagnosis not present

## 2021-01-28 DIAGNOSIS — N186 End stage renal disease: Secondary | ICD-10-CM | POA: Diagnosis not present

## 2021-01-28 DIAGNOSIS — Z992 Dependence on renal dialysis: Secondary | ICD-10-CM | POA: Diagnosis not present

## 2021-01-28 DIAGNOSIS — N2581 Secondary hyperparathyroidism of renal origin: Secondary | ICD-10-CM | POA: Diagnosis not present

## 2021-01-30 DIAGNOSIS — N186 End stage renal disease: Secondary | ICD-10-CM | POA: Diagnosis not present

## 2021-01-30 DIAGNOSIS — Z992 Dependence on renal dialysis: Secondary | ICD-10-CM | POA: Diagnosis not present

## 2021-01-30 DIAGNOSIS — N2581 Secondary hyperparathyroidism of renal origin: Secondary | ICD-10-CM | POA: Diagnosis not present

## 2021-02-01 DIAGNOSIS — Z992 Dependence on renal dialysis: Secondary | ICD-10-CM | POA: Diagnosis not present

## 2021-02-01 DIAGNOSIS — N186 End stage renal disease: Secondary | ICD-10-CM | POA: Diagnosis not present

## 2021-02-01 DIAGNOSIS — N2581 Secondary hyperparathyroidism of renal origin: Secondary | ICD-10-CM | POA: Diagnosis not present

## 2021-02-04 DIAGNOSIS — Z992 Dependence on renal dialysis: Secondary | ICD-10-CM | POA: Diagnosis not present

## 2021-02-04 DIAGNOSIS — N2581 Secondary hyperparathyroidism of renal origin: Secondary | ICD-10-CM | POA: Diagnosis not present

## 2021-02-04 DIAGNOSIS — N186 End stage renal disease: Secondary | ICD-10-CM | POA: Diagnosis not present

## 2021-02-06 DIAGNOSIS — Z992 Dependence on renal dialysis: Secondary | ICD-10-CM | POA: Diagnosis not present

## 2021-02-06 DIAGNOSIS — N186 End stage renal disease: Secondary | ICD-10-CM | POA: Diagnosis not present

## 2021-02-06 DIAGNOSIS — N2581 Secondary hyperparathyroidism of renal origin: Secondary | ICD-10-CM | POA: Diagnosis not present

## 2021-02-08 DIAGNOSIS — Z992 Dependence on renal dialysis: Secondary | ICD-10-CM | POA: Diagnosis not present

## 2021-02-08 DIAGNOSIS — N186 End stage renal disease: Secondary | ICD-10-CM | POA: Diagnosis not present

## 2021-02-08 DIAGNOSIS — N2581 Secondary hyperparathyroidism of renal origin: Secondary | ICD-10-CM | POA: Diagnosis not present

## 2021-02-11 DIAGNOSIS — N186 End stage renal disease: Secondary | ICD-10-CM | POA: Diagnosis not present

## 2021-02-11 DIAGNOSIS — R197 Diarrhea, unspecified: Secondary | ICD-10-CM | POA: Diagnosis not present

## 2021-02-11 DIAGNOSIS — N2581 Secondary hyperparathyroidism of renal origin: Secondary | ICD-10-CM | POA: Diagnosis not present

## 2021-02-11 DIAGNOSIS — Z992 Dependence on renal dialysis: Secondary | ICD-10-CM | POA: Diagnosis not present

## 2021-02-13 DIAGNOSIS — Z992 Dependence on renal dialysis: Secondary | ICD-10-CM | POA: Diagnosis not present

## 2021-02-13 DIAGNOSIS — N2581 Secondary hyperparathyroidism of renal origin: Secondary | ICD-10-CM | POA: Diagnosis not present

## 2021-02-13 DIAGNOSIS — N186 End stage renal disease: Secondary | ICD-10-CM | POA: Diagnosis not present

## 2021-02-13 DIAGNOSIS — R197 Diarrhea, unspecified: Secondary | ICD-10-CM | POA: Diagnosis not present

## 2021-02-15 DIAGNOSIS — N2581 Secondary hyperparathyroidism of renal origin: Secondary | ICD-10-CM | POA: Diagnosis not present

## 2021-02-15 DIAGNOSIS — R197 Diarrhea, unspecified: Secondary | ICD-10-CM | POA: Diagnosis not present

## 2021-02-15 DIAGNOSIS — N186 End stage renal disease: Secondary | ICD-10-CM | POA: Diagnosis not present

## 2021-02-15 DIAGNOSIS — Z992 Dependence on renal dialysis: Secondary | ICD-10-CM | POA: Diagnosis not present

## 2021-02-18 DIAGNOSIS — N186 End stage renal disease: Secondary | ICD-10-CM | POA: Diagnosis not present

## 2021-02-18 DIAGNOSIS — N2581 Secondary hyperparathyroidism of renal origin: Secondary | ICD-10-CM | POA: Diagnosis not present

## 2021-02-18 DIAGNOSIS — R197 Diarrhea, unspecified: Secondary | ICD-10-CM | POA: Diagnosis not present

## 2021-02-18 DIAGNOSIS — Z992 Dependence on renal dialysis: Secondary | ICD-10-CM | POA: Diagnosis not present

## 2021-02-20 DIAGNOSIS — Z992 Dependence on renal dialysis: Secondary | ICD-10-CM | POA: Diagnosis not present

## 2021-02-20 DIAGNOSIS — N186 End stage renal disease: Secondary | ICD-10-CM | POA: Diagnosis not present

## 2021-02-20 DIAGNOSIS — N2581 Secondary hyperparathyroidism of renal origin: Secondary | ICD-10-CM | POA: Diagnosis not present

## 2021-02-22 DIAGNOSIS — N2581 Secondary hyperparathyroidism of renal origin: Secondary | ICD-10-CM | POA: Diagnosis not present

## 2021-02-22 DIAGNOSIS — N186 End stage renal disease: Secondary | ICD-10-CM | POA: Diagnosis not present

## 2021-02-22 DIAGNOSIS — Z992 Dependence on renal dialysis: Secondary | ICD-10-CM | POA: Diagnosis not present

## 2021-02-25 DIAGNOSIS — N186 End stage renal disease: Secondary | ICD-10-CM | POA: Diagnosis not present

## 2021-02-25 DIAGNOSIS — I129 Hypertensive chronic kidney disease with stage 1 through stage 4 chronic kidney disease, or unspecified chronic kidney disease: Secondary | ICD-10-CM | POA: Diagnosis not present

## 2021-02-25 DIAGNOSIS — Z992 Dependence on renal dialysis: Secondary | ICD-10-CM | POA: Diagnosis not present

## 2021-02-25 DIAGNOSIS — N2581 Secondary hyperparathyroidism of renal origin: Secondary | ICD-10-CM | POA: Diagnosis not present

## 2021-02-27 DIAGNOSIS — Z992 Dependence on renal dialysis: Secondary | ICD-10-CM | POA: Diagnosis not present

## 2021-02-27 DIAGNOSIS — N2581 Secondary hyperparathyroidism of renal origin: Secondary | ICD-10-CM | POA: Diagnosis not present

## 2021-02-27 DIAGNOSIS — N186 End stage renal disease: Secondary | ICD-10-CM | POA: Diagnosis not present

## 2021-02-28 DIAGNOSIS — N186 End stage renal disease: Secondary | ICD-10-CM | POA: Diagnosis not present

## 2021-02-28 DIAGNOSIS — Z992 Dependence on renal dialysis: Secondary | ICD-10-CM | POA: Diagnosis not present

## 2021-02-28 DIAGNOSIS — T82858A Stenosis of vascular prosthetic devices, implants and grafts, initial encounter: Secondary | ICD-10-CM | POA: Diagnosis not present

## 2021-02-28 DIAGNOSIS — I871 Compression of vein: Secondary | ICD-10-CM | POA: Diagnosis not present

## 2021-03-01 DIAGNOSIS — N2581 Secondary hyperparathyroidism of renal origin: Secondary | ICD-10-CM | POA: Diagnosis not present

## 2021-03-01 DIAGNOSIS — Z992 Dependence on renal dialysis: Secondary | ICD-10-CM | POA: Diagnosis not present

## 2021-03-01 DIAGNOSIS — N186 End stage renal disease: Secondary | ICD-10-CM | POA: Diagnosis not present

## 2021-03-04 DIAGNOSIS — Z992 Dependence on renal dialysis: Secondary | ICD-10-CM | POA: Diagnosis not present

## 2021-03-04 DIAGNOSIS — N186 End stage renal disease: Secondary | ICD-10-CM | POA: Diagnosis not present

## 2021-03-04 DIAGNOSIS — N2581 Secondary hyperparathyroidism of renal origin: Secondary | ICD-10-CM | POA: Diagnosis not present

## 2021-03-06 DIAGNOSIS — Z992 Dependence on renal dialysis: Secondary | ICD-10-CM | POA: Diagnosis not present

## 2021-03-06 DIAGNOSIS — N186 End stage renal disease: Secondary | ICD-10-CM | POA: Diagnosis not present

## 2021-03-06 DIAGNOSIS — N2581 Secondary hyperparathyroidism of renal origin: Secondary | ICD-10-CM | POA: Diagnosis not present

## 2021-03-07 DIAGNOSIS — E113493 Type 2 diabetes mellitus with severe nonproliferative diabetic retinopathy without macular edema, bilateral: Secondary | ICD-10-CM | POA: Diagnosis not present

## 2021-03-08 DIAGNOSIS — N2581 Secondary hyperparathyroidism of renal origin: Secondary | ICD-10-CM | POA: Diagnosis not present

## 2021-03-08 DIAGNOSIS — Z992 Dependence on renal dialysis: Secondary | ICD-10-CM | POA: Diagnosis not present

## 2021-03-08 DIAGNOSIS — N186 End stage renal disease: Secondary | ICD-10-CM | POA: Diagnosis not present

## 2021-03-11 DIAGNOSIS — R197 Diarrhea, unspecified: Secondary | ICD-10-CM | POA: Diagnosis not present

## 2021-03-11 DIAGNOSIS — N2581 Secondary hyperparathyroidism of renal origin: Secondary | ICD-10-CM | POA: Diagnosis not present

## 2021-03-11 DIAGNOSIS — N186 End stage renal disease: Secondary | ICD-10-CM | POA: Diagnosis not present

## 2021-03-11 DIAGNOSIS — Z992 Dependence on renal dialysis: Secondary | ICD-10-CM | POA: Diagnosis not present

## 2021-03-13 DIAGNOSIS — Z992 Dependence on renal dialysis: Secondary | ICD-10-CM | POA: Diagnosis not present

## 2021-03-13 DIAGNOSIS — N2581 Secondary hyperparathyroidism of renal origin: Secondary | ICD-10-CM | POA: Diagnosis not present

## 2021-03-13 DIAGNOSIS — R197 Diarrhea, unspecified: Secondary | ICD-10-CM | POA: Diagnosis not present

## 2021-03-13 DIAGNOSIS — N186 End stage renal disease: Secondary | ICD-10-CM | POA: Diagnosis not present

## 2021-03-15 DIAGNOSIS — N2581 Secondary hyperparathyroidism of renal origin: Secondary | ICD-10-CM | POA: Diagnosis not present

## 2021-03-15 DIAGNOSIS — Z992 Dependence on renal dialysis: Secondary | ICD-10-CM | POA: Diagnosis not present

## 2021-03-15 DIAGNOSIS — R197 Diarrhea, unspecified: Secondary | ICD-10-CM | POA: Diagnosis not present

## 2021-03-15 DIAGNOSIS — N186 End stage renal disease: Secondary | ICD-10-CM | POA: Diagnosis not present

## 2021-03-18 DIAGNOSIS — N186 End stage renal disease: Secondary | ICD-10-CM | POA: Diagnosis not present

## 2021-03-18 DIAGNOSIS — Z992 Dependence on renal dialysis: Secondary | ICD-10-CM | POA: Diagnosis not present

## 2021-03-18 DIAGNOSIS — N2581 Secondary hyperparathyroidism of renal origin: Secondary | ICD-10-CM | POA: Diagnosis not present

## 2021-03-18 DIAGNOSIS — R197 Diarrhea, unspecified: Secondary | ICD-10-CM | POA: Diagnosis not present

## 2021-03-20 DIAGNOSIS — R197 Diarrhea, unspecified: Secondary | ICD-10-CM | POA: Diagnosis not present

## 2021-03-20 DIAGNOSIS — N186 End stage renal disease: Secondary | ICD-10-CM | POA: Diagnosis not present

## 2021-03-20 DIAGNOSIS — N2581 Secondary hyperparathyroidism of renal origin: Secondary | ICD-10-CM | POA: Diagnosis not present

## 2021-03-20 DIAGNOSIS — Z992 Dependence on renal dialysis: Secondary | ICD-10-CM | POA: Diagnosis not present

## 2021-03-23 DIAGNOSIS — N186 End stage renal disease: Secondary | ICD-10-CM | POA: Diagnosis not present

## 2021-03-23 DIAGNOSIS — N2581 Secondary hyperparathyroidism of renal origin: Secondary | ICD-10-CM | POA: Diagnosis not present

## 2021-03-23 DIAGNOSIS — R197 Diarrhea, unspecified: Secondary | ICD-10-CM | POA: Diagnosis not present

## 2021-03-23 DIAGNOSIS — Z992 Dependence on renal dialysis: Secondary | ICD-10-CM | POA: Diagnosis not present

## 2021-03-25 DIAGNOSIS — Z992 Dependence on renal dialysis: Secondary | ICD-10-CM | POA: Diagnosis not present

## 2021-03-25 DIAGNOSIS — N2581 Secondary hyperparathyroidism of renal origin: Secondary | ICD-10-CM | POA: Diagnosis not present

## 2021-03-25 DIAGNOSIS — N186 End stage renal disease: Secondary | ICD-10-CM | POA: Diagnosis not present

## 2021-03-27 DIAGNOSIS — I129 Hypertensive chronic kidney disease with stage 1 through stage 4 chronic kidney disease, or unspecified chronic kidney disease: Secondary | ICD-10-CM | POA: Diagnosis not present

## 2021-03-27 DIAGNOSIS — Z992 Dependence on renal dialysis: Secondary | ICD-10-CM | POA: Diagnosis not present

## 2021-03-27 DIAGNOSIS — N186 End stage renal disease: Secondary | ICD-10-CM | POA: Diagnosis not present

## 2021-03-27 DIAGNOSIS — N2581 Secondary hyperparathyroidism of renal origin: Secondary | ICD-10-CM | POA: Diagnosis not present

## 2021-03-28 DIAGNOSIS — Z01810 Encounter for preprocedural cardiovascular examination: Secondary | ICD-10-CM | POA: Diagnosis not present

## 2021-03-28 DIAGNOSIS — N189 Chronic kidney disease, unspecified: Secondary | ICD-10-CM | POA: Diagnosis not present

## 2021-03-28 DIAGNOSIS — N186 End stage renal disease: Secondary | ICD-10-CM | POA: Diagnosis not present

## 2021-03-30 DIAGNOSIS — N186 End stage renal disease: Secondary | ICD-10-CM | POA: Diagnosis not present

## 2021-03-30 DIAGNOSIS — N2581 Secondary hyperparathyroidism of renal origin: Secondary | ICD-10-CM | POA: Diagnosis not present

## 2021-03-30 DIAGNOSIS — Z992 Dependence on renal dialysis: Secondary | ICD-10-CM | POA: Diagnosis not present

## 2021-04-01 DIAGNOSIS — N2581 Secondary hyperparathyroidism of renal origin: Secondary | ICD-10-CM | POA: Diagnosis not present

## 2021-04-01 DIAGNOSIS — Z992 Dependence on renal dialysis: Secondary | ICD-10-CM | POA: Diagnosis not present

## 2021-04-01 DIAGNOSIS — N186 End stage renal disease: Secondary | ICD-10-CM | POA: Diagnosis not present

## 2021-04-03 DIAGNOSIS — N186 End stage renal disease: Secondary | ICD-10-CM | POA: Diagnosis not present

## 2021-04-03 DIAGNOSIS — N2581 Secondary hyperparathyroidism of renal origin: Secondary | ICD-10-CM | POA: Diagnosis not present

## 2021-04-03 DIAGNOSIS — Z992 Dependence on renal dialysis: Secondary | ICD-10-CM | POA: Diagnosis not present

## 2021-04-05 DIAGNOSIS — N186 End stage renal disease: Secondary | ICD-10-CM | POA: Diagnosis not present

## 2021-04-05 DIAGNOSIS — N2581 Secondary hyperparathyroidism of renal origin: Secondary | ICD-10-CM | POA: Diagnosis not present

## 2021-04-05 DIAGNOSIS — Z992 Dependence on renal dialysis: Secondary | ICD-10-CM | POA: Diagnosis not present

## 2021-04-08 DIAGNOSIS — N186 End stage renal disease: Secondary | ICD-10-CM | POA: Diagnosis not present

## 2021-04-08 DIAGNOSIS — N2581 Secondary hyperparathyroidism of renal origin: Secondary | ICD-10-CM | POA: Diagnosis not present

## 2021-04-08 DIAGNOSIS — Z992 Dependence on renal dialysis: Secondary | ICD-10-CM | POA: Diagnosis not present

## 2021-04-09 DIAGNOSIS — E113593 Type 2 diabetes mellitus with proliferative diabetic retinopathy without macular edema, bilateral: Secondary | ICD-10-CM | POA: Diagnosis not present

## 2021-04-10 DIAGNOSIS — N186 End stage renal disease: Secondary | ICD-10-CM | POA: Diagnosis not present

## 2021-04-10 DIAGNOSIS — Z992 Dependence on renal dialysis: Secondary | ICD-10-CM | POA: Diagnosis not present

## 2021-04-10 DIAGNOSIS — N2581 Secondary hyperparathyroidism of renal origin: Secondary | ICD-10-CM | POA: Diagnosis not present

## 2021-04-12 DIAGNOSIS — N2581 Secondary hyperparathyroidism of renal origin: Secondary | ICD-10-CM | POA: Diagnosis not present

## 2021-04-12 DIAGNOSIS — N186 End stage renal disease: Secondary | ICD-10-CM | POA: Diagnosis not present

## 2021-04-12 DIAGNOSIS — Z992 Dependence on renal dialysis: Secondary | ICD-10-CM | POA: Diagnosis not present

## 2021-04-15 DIAGNOSIS — N2581 Secondary hyperparathyroidism of renal origin: Secondary | ICD-10-CM | POA: Diagnosis not present

## 2021-04-15 DIAGNOSIS — N186 End stage renal disease: Secondary | ICD-10-CM | POA: Diagnosis not present

## 2021-04-15 DIAGNOSIS — Z992 Dependence on renal dialysis: Secondary | ICD-10-CM | POA: Diagnosis not present

## 2021-04-17 DIAGNOSIS — Z992 Dependence on renal dialysis: Secondary | ICD-10-CM | POA: Diagnosis not present

## 2021-04-17 DIAGNOSIS — N186 End stage renal disease: Secondary | ICD-10-CM | POA: Diagnosis not present

## 2021-04-17 DIAGNOSIS — N2581 Secondary hyperparathyroidism of renal origin: Secondary | ICD-10-CM | POA: Diagnosis not present

## 2021-04-19 DIAGNOSIS — Z992 Dependence on renal dialysis: Secondary | ICD-10-CM | POA: Diagnosis not present

## 2021-04-19 DIAGNOSIS — N2581 Secondary hyperparathyroidism of renal origin: Secondary | ICD-10-CM | POA: Diagnosis not present

## 2021-04-19 DIAGNOSIS — N186 End stage renal disease: Secondary | ICD-10-CM | POA: Diagnosis not present

## 2021-04-22 DIAGNOSIS — N186 End stage renal disease: Secondary | ICD-10-CM | POA: Diagnosis not present

## 2021-04-22 DIAGNOSIS — N2581 Secondary hyperparathyroidism of renal origin: Secondary | ICD-10-CM | POA: Diagnosis not present

## 2021-04-22 DIAGNOSIS — R197 Diarrhea, unspecified: Secondary | ICD-10-CM | POA: Diagnosis not present

## 2021-04-22 DIAGNOSIS — Z992 Dependence on renal dialysis: Secondary | ICD-10-CM | POA: Diagnosis not present

## 2021-04-23 DIAGNOSIS — J019 Acute sinusitis, unspecified: Secondary | ICD-10-CM | POA: Diagnosis not present

## 2021-04-26 DIAGNOSIS — N186 End stage renal disease: Secondary | ICD-10-CM | POA: Diagnosis not present

## 2021-04-26 DIAGNOSIS — Z992 Dependence on renal dialysis: Secondary | ICD-10-CM | POA: Diagnosis not present

## 2021-04-26 DIAGNOSIS — N2581 Secondary hyperparathyroidism of renal origin: Secondary | ICD-10-CM | POA: Diagnosis not present

## 2021-04-26 DIAGNOSIS — R197 Diarrhea, unspecified: Secondary | ICD-10-CM | POA: Diagnosis not present

## 2021-04-27 DIAGNOSIS — I129 Hypertensive chronic kidney disease with stage 1 through stage 4 chronic kidney disease, or unspecified chronic kidney disease: Secondary | ICD-10-CM | POA: Diagnosis not present

## 2021-04-27 DIAGNOSIS — Z992 Dependence on renal dialysis: Secondary | ICD-10-CM | POA: Diagnosis not present

## 2021-04-27 DIAGNOSIS — N186 End stage renal disease: Secondary | ICD-10-CM | POA: Diagnosis not present

## 2021-04-29 DIAGNOSIS — N2581 Secondary hyperparathyroidism of renal origin: Secondary | ICD-10-CM | POA: Diagnosis not present

## 2021-04-29 DIAGNOSIS — N186 End stage renal disease: Secondary | ICD-10-CM | POA: Diagnosis not present

## 2021-04-29 DIAGNOSIS — Z992 Dependence on renal dialysis: Secondary | ICD-10-CM | POA: Diagnosis not present

## 2021-05-02 DIAGNOSIS — K219 Gastro-esophageal reflux disease without esophagitis: Secondary | ICD-10-CM | POA: Diagnosis not present

## 2021-05-02 DIAGNOSIS — N185 Chronic kidney disease, stage 5: Secondary | ICD-10-CM | POA: Diagnosis not present

## 2021-05-02 DIAGNOSIS — R1110 Vomiting, unspecified: Secondary | ICD-10-CM | POA: Diagnosis not present

## 2021-05-02 DIAGNOSIS — J329 Chronic sinusitis, unspecified: Secondary | ICD-10-CM | POA: Diagnosis not present

## 2021-05-02 DIAGNOSIS — I10 Essential (primary) hypertension: Secondary | ICD-10-CM | POA: Diagnosis not present

## 2021-05-02 DIAGNOSIS — Z1331 Encounter for screening for depression: Secondary | ICD-10-CM | POA: Diagnosis not present

## 2021-05-02 DIAGNOSIS — E785 Hyperlipidemia, unspecified: Secondary | ICD-10-CM | POA: Diagnosis not present

## 2021-05-03 DIAGNOSIS — N186 End stage renal disease: Secondary | ICD-10-CM | POA: Diagnosis not present

## 2021-05-03 DIAGNOSIS — Z992 Dependence on renal dialysis: Secondary | ICD-10-CM | POA: Diagnosis not present

## 2021-05-03 DIAGNOSIS — N2581 Secondary hyperparathyroidism of renal origin: Secondary | ICD-10-CM | POA: Diagnosis not present

## 2021-05-06 DIAGNOSIS — N186 End stage renal disease: Secondary | ICD-10-CM | POA: Diagnosis not present

## 2021-05-06 DIAGNOSIS — Z992 Dependence on renal dialysis: Secondary | ICD-10-CM | POA: Diagnosis not present

## 2021-05-06 DIAGNOSIS — N2581 Secondary hyperparathyroidism of renal origin: Secondary | ICD-10-CM | POA: Diagnosis not present

## 2021-05-08 ENCOUNTER — Telehealth: Payer: Self-pay | Admitting: Internal Medicine

## 2021-05-08 DIAGNOSIS — N2581 Secondary hyperparathyroidism of renal origin: Secondary | ICD-10-CM | POA: Diagnosis not present

## 2021-05-08 DIAGNOSIS — N186 End stage renal disease: Secondary | ICD-10-CM | POA: Diagnosis not present

## 2021-05-08 DIAGNOSIS — Z992 Dependence on renal dialysis: Secondary | ICD-10-CM | POA: Diagnosis not present

## 2021-05-08 NOTE — Telephone Encounter (Signed)
Sure

## 2021-05-08 NOTE — Telephone Encounter (Signed)
Hi Dr. Silverio Decamp,  Would you approve the request below?

## 2021-05-08 NOTE — Telephone Encounter (Signed)
No problem.

## 2021-05-08 NOTE — Telephone Encounter (Signed)
Hi Dr. Henrene Pastor,  This patient is requesting a transfer of care from you to Dr. Silverio Decamp said she prefers a female provider.   Please advise on scheduling.   Thanks

## 2021-05-10 ENCOUNTER — Encounter: Payer: Self-pay | Admitting: Gastroenterology

## 2021-05-10 DIAGNOSIS — Z992 Dependence on renal dialysis: Secondary | ICD-10-CM | POA: Diagnosis not present

## 2021-05-10 DIAGNOSIS — N2581 Secondary hyperparathyroidism of renal origin: Secondary | ICD-10-CM | POA: Diagnosis not present

## 2021-05-10 DIAGNOSIS — N186 End stage renal disease: Secondary | ICD-10-CM | POA: Diagnosis not present

## 2021-05-13 DIAGNOSIS — Z992 Dependence on renal dialysis: Secondary | ICD-10-CM | POA: Diagnosis not present

## 2021-05-13 DIAGNOSIS — N2581 Secondary hyperparathyroidism of renal origin: Secondary | ICD-10-CM | POA: Diagnosis not present

## 2021-05-13 DIAGNOSIS — N186 End stage renal disease: Secondary | ICD-10-CM | POA: Diagnosis not present

## 2021-05-14 ENCOUNTER — Other Ambulatory Visit: Payer: Self-pay

## 2021-05-14 ENCOUNTER — Encounter: Payer: Self-pay | Admitting: Internal Medicine

## 2021-05-14 ENCOUNTER — Ambulatory Visit: Payer: BC Managed Care – PPO | Admitting: Internal Medicine

## 2021-05-14 VITALS — BP 176/82 | HR 70 | Resp 20 | Ht 63.0 in | Wt 172.0 lb

## 2021-05-14 DIAGNOSIS — R0989 Other specified symptoms and signs involving the circulatory and respiratory systems: Secondary | ICD-10-CM | POA: Diagnosis not present

## 2021-05-14 MED ORDER — ATORVASTATIN CALCIUM 40 MG PO TABS: 40.0000 mg | ORAL_TABLET | Freq: Every day | ORAL | 3 refills | Status: DC

## 2021-05-14 NOTE — Patient Instructions (Addendum)
Medication Instructions:  START: ATORVASTATIN 40mg  DAILY  *If you need a refill on your cardiac medications before your next appointment, please call your pharmacy*  Testing/Procedures: Your physician has requested that you have a carotid duplex. This test is an ultrasound of the carotid arteries in your neck. It looks at blood flow through these arteries that supply the brain with blood. Allow one hour for this exam. There are no restrictions or special instructions.  Your physician has requested that you have a cardiac catheterization. Cardiac catheterization is used to diagnose and/or treat various heart conditions. Doctors may recommend this procedure for a number of different reasons. The most common reason is to evaluate chest pain. Chest pain can be a symptom of coronary artery disease (CAD), and cardiac catheterization can show whether plaque is narrowing or blocking your hearts arteries. This procedure is also used to evaluate the valves, as well as measure the blood flow and oxygen levels in different parts of your heart. For further information please visit [email or web address]. Please follow instruction sheet, as given. PLEASE CALL USE [phone] TO DISCUSS SCHEDULING HEART CATH  Follow-Up: At [facility], you and your health needs are our priority.  As part of our continuing mission to provide you with exceptional heart care, we have created designated Provider Care Teams.  These Care Teams include your primary Cardiologist (physician) and Advanced Practice Providers (APPs -  Physician Assistants and Nurse Practitioners) who all work together to provide you with the care you need, when you need it.  We recommend signing up for the patient portal called "MyChart".  Sign up information is provided on this After Visit Summary.  MyChart is used to connect with patients for Virtual Visits (Telemedicine).  Patients are able to view lab/test results, encounter notes, upcoming appointments,  etc.  Non-urgent messages can be sent to your provider as well.   To learn more about what you can do with MyChart, go to [email or web address].    Your next appointment:   3 month(s)  The format for your next appointment:   In Person  Provider:   Janina Mayo, MD

## 2021-05-14 NOTE — Progress Notes (Signed)
Cardiology Office Note:    Date:  05/14/2021   ID:  Lynda Wanninger, DOB 1955/07/29, MRN [id number]  PCP:  Renaldo Reel, PA   [facility] Providers Cardiologist:  Janina Mayo, MD     Referring MD: Justin Mend, MD   No chief complaint on file. Renal Transplant CVD risk assessment  History of Present Illness:    Alison Baker is a 66 y.o. female with a hx of  ESRD MWF started in 2018, former smoker, no known cardiac dx hx, prediabetes, referral for renal Transplant CVD risk assessment  Per the records from [facility]:  She had a stress echo in July 2022 that was non diagnostic because she did not reach max predicted HR (only 48%) with high doses of labetalol with significant hypertension. There was normal wall motion. Considering study was non diagnostic she underwent lexiscan.  The EKG portion of her lexiscan stress was non diagnostic due to baseline ST depressions on 03/28/2021.  Further she was noted to have profound blood pressure drop during the stress portion of the lexiscan from 204/82 mmHg to 122/60 mmHg. She had transient chest burning, flushing, abdominal pain as well as nausea and vomiting. The SPECT was read as EF 65%, EDV 129 cc. Mild inferolateral attenuation v scar (worse on stress then rest images). Normal wall motion.  Echo at this time showed EF 55-60%. No diastolic dysfunction.  No valve dx  Her echo was normal in 2018. There was c/f possible PFO  She was referred here due to non diagnostic studies and significant hypotension during lexiscan.  Today, she denies chest pain or SOB. She gets dialysis MWF. She smoked for 20 years in the past. No known cardiac dx. Kidney dx she was told 2/2 diabetes but A1c have not met criteria. She has poorly controlled HTN.   Past Medical History:  Diagnosis Date   Anemia    Anxiety    Complication of anesthesia    slow to awaken, sentive to medications rarely even takes a Tylenol.   Diarrhea    End-stage kidney disease  ([place])    M/W/F- [place]   Gastric ulcer with hemorrhage    GERD (gastroesophageal reflux disease)    Hyperlipidemia    Hypertension    Pneumonia    PONV (postoperative nausea and vomiting)    after hysterectomy- 1980's   Pre-diabetes    Umbilical hernia     Past Surgical History:  Procedure Laterality Date   APPENDECTOMY     BASCILIC VEIN TRANSPOSITION Left 07/15/2016   Procedure: BRACHIAL VEIN TRANSPOSITION FIRST STAGE;  Surgeon: Conrad Springdale, MD;  Location: [facility];  Service: Vascular;  Laterality: Left;   [place] Left 11/04/2016   Procedure: SECOND STAGE BRACHIAL VEIN TRANSPOSITION;  Surgeon: Conrad Buttonwillow, MD;  Location: [facility];  Service: Vascular;  Laterality: Left;   CHOLECYSTECTOMY     partial hepatectomy   COLONOSCOPY W/ POLYPECTOMY     EYE SURGERY Right    catarct   EYE SURGERY Left 08/2016   cataract   INSERTION OF DIALYSIS CATHETER     IR GENERIC HISTORICAL  06/23/2016   IR US GUIDE VASC ACCESS RIGHT 06/23/2016 [facility], [facility]   IR GENERIC HISTORICAL  06/23/2016   IR FLUORO GUIDE CV LINE RIGHT 06/23/2016 [facility], [facility]   TOTAL ABDOMINAL HYSTERECTOMY W/ BILATERAL SALPINGOOPHORECTOMY     TRABECULECTOMY Bilateral     Current Medications: Current Meds  Medication Sig  acetaminophen (TYLENOL) 500 MG tablet Take 500 mg by mouth 3 (three) times daily as needed for headache.   amLODipine (NORVASC) 10 MG tablet Take 10 mg by mouth See admin instructions. Takes on non dialysis days, Sunday, Tuesday, Thursday and Saturday takes at bedtime   atorvastatin (LIPITOR) 40 MG tablet Take 1 tablet (40 mg total) by mouth daily.   fluticasone (FLONASE) 50 MCG/ACT nasal spray Place into both nostrils daily.   folic acid-vitamin b complex-vitamin c-selenium-zinc (DIALYVITE) 3 MG TABS tablet Take 1 tablet by mouth daily.   labetalol (NORMODYNE) 100 MG tablet Take 1 tablet by mouth daily.   LOPERAMIDE HCL PO Take 2 mg by mouth as needed.    meclizine (ANTIVERT) 25 MG tablet Take 25 mg by mouth 3 (three) times daily as needed for dizziness.   omeprazole (PRILOSEC) 40 MG capsule Take 1 capsule (40 mg total) by mouth daily.   ondansetron (ZOFRAN-ODT) 8 MG disintegrating tablet Take 8 mg by mouth 3 (three) times daily.   [DISCONTINUED] omeprazole (PRILOSEC) 40 MG capsule Take 1 capsule (40 mg total) by mouth daily.     Allergies:   Nsaids, Penicillins, Strawberry extract, Clonidine, and Sulfa antibiotics   Social History   Socioeconomic History   Marital status: Married    Spouse name: Not on file   Number of children: Not on file   Years of education: Not on file   Highest education level: Not on file  Occupational History   Not on file  Tobacco Use   Smoking status: Former    Types: Cigarettes    Quit date: 06/27/1995    Years since quitting: 25.8   Smokeless tobacco: Never  Vaping Use   Vaping Use: Never used  Substance and Sexual Activity   Alcohol use: No   Drug use: No   Sexual activity: Not on file  Other Topics Concern   Not on file  Social History Narrative   Not on file   Social Determinants of Health   Financial Resource Strain: Not on file  Food Insecurity: Not on file  Transportation Needs: Not on file  Physical Activity: Not on file  Stress: Not on file  Social Connections: Not on file     Family History: The patient's family history includes Cancer in her sister; Hypertension in her mother. There is no history of Colon cancer, Esophageal cancer, Stomach cancer, Pancreatic cancer, or Liver disease.  ROS:   Please see the history of present illness.     All other systems reviewed and are negative.  EKGs/Labs/Other Studies Reviewed:    The following studies were reviewed today:   EKG:  EKG is  ordered today.  The ekg ordered today demonstrates   NSR, no ischemic changes, QTc 490 ms  Recent Labs: No results found for requested labs within last 8760 hours.  Recent Lipid Panel No  results found for: CHOL, TRIG, HDL, CHOLHDL, VLDL, LDLCALC, LDLDIRECT   Risk Assessment/Calculations:           ASCVD 29%  Physical Exam:    VS:  BP (!) 176/82 (BP Location: Right Arm, Patient Position: Sitting, Cuff Size: Normal)    Pulse 70    Resp 20    Ht _0  (1.6 m)    Wt 172 lb (78 kg)    SpO2 99%    BMI 30.47 kg/m     Wt Readings from Last 3 Encounters:  05/14/21 172 lb (78 kg)  03/28/20 178 lb 9.6 oz (81  kg)  02/25/18 177 lb 9.6 oz (80.6 kg)     GEN:  Well nourished, well developed in no acute distress HEENT: Normal NECK: No JVD; ++ carotid worse on L then R LYMPHATICS: No lymphadenopathy CARDIAC: RRR, no murmurs, rubs, gallops RESPIRATORY:  Clear to auscultation without rales, wheezing or rhonchi  ABDOMEN: Soft, non-tender, non-distended MUSCULOSKELETAL:  No edema; No deformity ; LUE fistula SKIN: Warm and dry NEUROLOGIC:  Alert and oriented x 3 PSYCHIATRIC:  Normal affect   ASSESSMENT:    #Abnormal Stress Test: She had significant BP drop with lexiscan from 200s to 120. Nuclear study showed possible artifact inferorolaterally v scar per the records. She has carotid bruit and a former smoker. CTA would likely include diffuse calcification with renal dx. With risk and plan for possible renal tx will plan for LHC.  -- She is ESRD MWF. Hgb stable. LUE A-V fistula. Right radial.  - patient prefers to think about LHC since her friend died on the cath table years ago. Will touch base in a week - cont labetalol 100 mg BID  The patient understands that risks included but are not limited to stroke (1 in 1000), death (1 in 1000), kidney failure [usually temporary] (1 in 500), bleeding (1 in 200), allergic reaction [possibly serious] (1 in 200).     #Hypertension: per nephrology at [facility].  Cont BB per above. Continue norvasc 10 mg daily  #Carotid Bruit: has bruit, increased CVD risk. Will start statin medication. - carotid duplex BL - started atorvastatin 40 - if  has significant lesion can start antiplatelet - can repeat lipid levels on FU  PLAN:    In order of problems listed above:  Carotid duplex BL Start Atorvastatin 40 mg  LHC/RHC renal transplant eval ( patient prefers to think about yet, plan to touch base with her in about a week) Follow up in 3 months      Shared Decision Making/Informed Consent The risks [stroke (1 in 1000), death (1 in 1000), kidney failure [usually temporary] (1 in 500), bleeding (1 in 200), allergic reaction [possibly serious] (1 in 200)], benefits (diagnostic support and management of coronary artery disease) and alternatives of a cardiac catheterization were discussed in detail with Ms. Higham and she is willing to proceed.    Medication Adjustments/Labs and Tests Ordered: Current medicines are reviewed at length with the patient today.  Concerns regarding medicines are outlined above.  Orders Placed This Encounter  Procedures   EKG 12-Lead   VAS US CAROTID   Meds ordered this encounter  Medications   atorvastatin (LIPITOR) 40 MG tablet    Sig: Take 1 tablet (40 mg total) by mouth daily.    Dispense:  90 tablet    Refill:  3    Patient Instructions  Medication Instructions:  START: ATORVASTATIN 90m DAILY  *If you need a refill on your cardiac medications before your next appointment, please call your pharmacy*  Testing/Procedures: Your physician has requested that you have a carotid duplex. This test is an ultrasound of the carotid arteries in your neck. It looks at blood flow through these arteries that supply the brain with blood. Allow one hour for this exam. There are no restrictions or special instructions.  Your physician has requested that you have a cardiac catheterization. Cardiac catheterization is used to diagnose and/or treat various heart conditions. Doctors may recommend this procedure for a number of different reasons. The most common reason is to evaluate chest pain. Chest pain can be  a  symptom of coronary artery disease (CAD), and cardiac catheterization can show whether plaque is narrowing or blocking your hearts arteries. This procedure is also used to evaluate the valves, as well as measure the blood flow and oxygen levels in different parts of your heart. For further information please visit [email or web address]. Please follow instruction sheet, as given. PLEASE CALL USE [phone] TO DISCUSS SCHEDULING HEART CATH  Follow-Up: At [facility], you and your health needs are our priority.  As part of our continuing mission to provide you with exceptional heart care, we have created designated Provider Care Teams.  These Care Teams include your primary Cardiologist (physician) and Advanced Practice Providers (APPs -  Physician Assistants and Nurse Practitioners) who all work together to provide you with the care you need, when you need it.  We recommend signing up for the patient portal called "MyChart".  Sign up information is provided on this After Visit Summary.  MyChart is used to connect with patients for Virtual Visits (Telemedicine).  Patients are able to view lab/test results, encounter notes, upcoming appointments, etc.  Non-urgent messages can be sent to your provider as well.   To learn more about what you can do with MyChart, go to [email or web address].    Your next appointment:   3 month(s)  The format for your next appointment:   In Person  Provider:   Janina Mayo, MD       Signed, Janina Mayo, MD  05/14/2021 4:57 PM    [place]

## 2021-05-15 DIAGNOSIS — N186 End stage renal disease: Secondary | ICD-10-CM | POA: Diagnosis not present

## 2021-05-15 DIAGNOSIS — Z992 Dependence on renal dialysis: Secondary | ICD-10-CM | POA: Diagnosis not present

## 2021-05-15 DIAGNOSIS — N2581 Secondary hyperparathyroidism of renal origin: Secondary | ICD-10-CM | POA: Diagnosis not present

## 2021-05-17 DIAGNOSIS — N186 End stage renal disease: Secondary | ICD-10-CM | POA: Diagnosis not present

## 2021-05-17 DIAGNOSIS — N2581 Secondary hyperparathyroidism of renal origin: Secondary | ICD-10-CM | POA: Diagnosis not present

## 2021-05-17 DIAGNOSIS — Z992 Dependence on renal dialysis: Secondary | ICD-10-CM | POA: Diagnosis not present

## 2021-05-20 DIAGNOSIS — Z992 Dependence on renal dialysis: Secondary | ICD-10-CM | POA: Diagnosis not present

## 2021-05-20 DIAGNOSIS — N186 End stage renal disease: Secondary | ICD-10-CM | POA: Diagnosis not present

## 2021-05-20 DIAGNOSIS — N2581 Secondary hyperparathyroidism of renal origin: Secondary | ICD-10-CM | POA: Diagnosis not present

## 2021-05-20 DIAGNOSIS — R197 Diarrhea, unspecified: Secondary | ICD-10-CM | POA: Diagnosis not present

## 2021-05-21 ENCOUNTER — Telehealth: Payer: Self-pay

## 2021-05-21 ENCOUNTER — Ambulatory Visit (HOSPITAL_COMMUNITY)
Admission: RE | Admit: 2021-05-21 | Discharge: 2021-05-21 | Disposition: A | Discharge status: Home / Self Care | Payer: BC Managed Care – PPO | Source: Ambulatory Visit | Attending: Cardiology | Admitting: Cardiology

## 2021-05-21 ENCOUNTER — Other Ambulatory Visit: Payer: Self-pay

## 2021-05-21 DIAGNOSIS — R0989 Other specified symptoms and signs involving the circulatory and respiratory systems: Secondary | ICD-10-CM | POA: Insufficient documentation

## 2021-05-21 DIAGNOSIS — R9439 Abnormal result of other cardiovascular function study: Secondary | ICD-10-CM

## 2021-05-21 NOTE — Telephone Encounter (Signed)
Patient walked into office for her Carotid Duplex today and asked to speak with this RN regarding her heart cath that was discussed at [facility] on 1/17. Patient states that she spoke with her Nephrology team who states that she can have a CT done if she wanted to instead of the heart cath if using less dye could be an option, or if she could schedule it to have dialysis the next day. Patient states she is still skeptical and apprehensive about which test she would like to have done, and would like to know what Dr. Harl Bowie thinks would be best. Advised patient I would forward her message on to Dr. Harl Bowie for her to review and advise. Patient also states she was told by her Nephrologist to not take the statin medication that was prescribed due to her dialysis. Advised patient I would forward message to Dr. Harl Bowie.   Advised patient I would be in contact with her when I hear back from Dr. Harl Bowie, patient very grateful for conversation and verbalized understanding.

## 2021-05-22 DIAGNOSIS — N2581 Secondary hyperparathyroidism of renal origin: Secondary | ICD-10-CM | POA: Diagnosis not present

## 2021-05-22 DIAGNOSIS — N186 End stage renal disease: Secondary | ICD-10-CM | POA: Diagnosis not present

## 2021-05-22 DIAGNOSIS — R197 Diarrhea, unspecified: Secondary | ICD-10-CM | POA: Diagnosis not present

## 2021-05-22 DIAGNOSIS — Z992 Dependence on renal dialysis: Secondary | ICD-10-CM | POA: Diagnosis not present

## 2021-05-23 ENCOUNTER — Other Ambulatory Visit: Payer: Self-pay

## 2021-05-23 DIAGNOSIS — R9439 Abnormal result of other cardiovascular function study: Secondary | ICD-10-CM

## 2021-05-23 NOTE — Telephone Encounter (Signed)
Spoke with Mrs. Regal. Notified her that coronary CTA is reasonable to do. She was told that starting statin was not indicated by her dialysis center. We discussed doing coronary CT to risk stratify her as well as determine if she has any flow limiting lesions that may benefit from [facility]

## 2021-05-24 DIAGNOSIS — N2581 Secondary hyperparathyroidism of renal origin: Secondary | ICD-10-CM | POA: Diagnosis not present

## 2021-05-24 DIAGNOSIS — Z992 Dependence on renal dialysis: Secondary | ICD-10-CM | POA: Diagnosis not present

## 2021-05-24 DIAGNOSIS — R197 Diarrhea, unspecified: Secondary | ICD-10-CM | POA: Diagnosis not present

## 2021-05-24 DIAGNOSIS — N186 End stage renal disease: Secondary | ICD-10-CM | POA: Diagnosis not present

## 2021-05-24 MED ORDER — METOPROLOL TARTRATE 100 MG PO TABS: 100.0000 mg | ORAL_TABLET | Freq: Once | ORAL | 0 refills | Status: DC

## 2021-05-24 NOTE — Telephone Encounter (Signed)
Please see other telephone encounter.

## 2021-05-24 NOTE — Addendum Note (Signed)
Addended by: Rexanne Mano B on: 05/24/2021 12:59 PM   Modules accepted: Orders

## 2021-05-24 NOTE — Telephone Encounter (Signed)
Alison Mayo, MD  You 3 minutes ago (1:42 PM)   MB Yes please    You  Alison Mayo, MD 46 minutes ago (12:59 PM)   Did you still want me to order the metop 100mg ? Thank you!!    Spoke with patient and advised her that Metoprolol Tartrate 100mg  2 hours prior to CCTA scan has been sent in to patient preferred pharmacy. Patient verbalized understanding.

## 2021-05-24 NOTE — Telephone Encounter (Signed)
Returned call to patient, advised patient that order for CTA scan has been placed. Patient aware of instructions.   Patient does take 100mg  labetalol daily patient states she's had trouble with bradycardia in the past with increase BB dosage- will send to MD to see if additional BB is needed before scan.   BMET order placed and patient aware to come for labs 1 week prior to CCTA scan.

## 2021-05-24 NOTE — Addendum Note (Signed)
Addended by: Rexanne Mano B on: 05/24/2021 01:52 PM   Modules accepted: Orders

## 2021-05-27 DIAGNOSIS — N186 End stage renal disease: Secondary | ICD-10-CM | POA: Diagnosis not present

## 2021-05-27 DIAGNOSIS — Z992 Dependence on renal dialysis: Secondary | ICD-10-CM | POA: Diagnosis not present

## 2021-05-27 DIAGNOSIS — N2581 Secondary hyperparathyroidism of renal origin: Secondary | ICD-10-CM | POA: Diagnosis not present

## 2021-05-28 DIAGNOSIS — Z992 Dependence on renal dialysis: Secondary | ICD-10-CM | POA: Diagnosis not present

## 2021-05-28 DIAGNOSIS — R9439 Abnormal result of other cardiovascular function study: Secondary | ICD-10-CM | POA: Diagnosis not present

## 2021-05-28 DIAGNOSIS — N186 End stage renal disease: Secondary | ICD-10-CM | POA: Diagnosis not present

## 2021-05-28 DIAGNOSIS — I129 Hypertensive chronic kidney disease with stage 1 through stage 4 chronic kidney disease, or unspecified chronic kidney disease: Secondary | ICD-10-CM | POA: Diagnosis not present

## 2021-05-29 DIAGNOSIS — N2581 Secondary hyperparathyroidism of renal origin: Secondary | ICD-10-CM | POA: Diagnosis not present

## 2021-05-29 DIAGNOSIS — N186 End stage renal disease: Secondary | ICD-10-CM | POA: Diagnosis not present

## 2021-05-29 DIAGNOSIS — R197 Diarrhea, unspecified: Secondary | ICD-10-CM | POA: Diagnosis not present

## 2021-05-29 DIAGNOSIS — Z992 Dependence on renal dialysis: Secondary | ICD-10-CM | POA: Diagnosis not present

## 2021-05-29 LAB — BASIC METABOLIC PANEL
BUN/Creatinine Ratio: 5 — ABNORMAL LOW (ref 12–28)
BUN: 29 mg/dL — ABNORMAL HIGH (ref 8–27)
CO2: 24 mmol/L (ref 20–29)
Calcium: 9 mg/dL (ref 8.7–10.3)
Chloride: 97 mmol/L (ref 96–106)
Creatinine, Ser: 6.4 mg/dL — ABNORMAL HIGH (ref 0.57–1.00)
Glucose: 86 mg/dL (ref 70–99)
Potassium: 4.6 mmol/L (ref 3.5–5.2)
Sodium: 142 mmol/L (ref 134–144)
eGFR: 7 mL/min/{1.73_m2} — ABNORMAL LOW (ref >59)

## 2021-05-31 DIAGNOSIS — N186 End stage renal disease: Secondary | ICD-10-CM | POA: Diagnosis not present

## 2021-05-31 DIAGNOSIS — N2581 Secondary hyperparathyroidism of renal origin: Secondary | ICD-10-CM | POA: Diagnosis not present

## 2021-05-31 DIAGNOSIS — R197 Diarrhea, unspecified: Secondary | ICD-10-CM | POA: Diagnosis not present

## 2021-05-31 DIAGNOSIS — Z992 Dependence on renal dialysis: Secondary | ICD-10-CM | POA: Diagnosis not present

## 2021-06-03 DIAGNOSIS — R197 Diarrhea, unspecified: Secondary | ICD-10-CM | POA: Diagnosis not present

## 2021-06-03 DIAGNOSIS — N186 End stage renal disease: Secondary | ICD-10-CM | POA: Diagnosis not present

## 2021-06-03 DIAGNOSIS — Z992 Dependence on renal dialysis: Secondary | ICD-10-CM | POA: Diagnosis not present

## 2021-06-03 DIAGNOSIS — N2581 Secondary hyperparathyroidism of renal origin: Secondary | ICD-10-CM | POA: Diagnosis not present

## 2021-06-04 ENCOUNTER — Telehealth (HOSPITAL_COMMUNITY): Payer: Self-pay | Admitting: Emergency Medicine

## 2021-06-04 NOTE — Telephone Encounter (Signed)
Reaching out to patient to offer assistance regarding upcoming cardiac imaging study; pt verbalizes understanding of appt date/time, parking situation and where to check in, pre-test NPO status and medications ordered, and verified current allergies; name and call back number provided for further questions should they arise Marchia Bond RN Navigator Cardiac Imaging [facility] [phone] office [phone] cell  R [facility] ONLY FOR IV 100mg  metoprolol tartrate  Arrival 1100

## 2021-06-05 DIAGNOSIS — Z992 Dependence on renal dialysis: Secondary | ICD-10-CM | POA: Diagnosis not present

## 2021-06-05 DIAGNOSIS — N2581 Secondary hyperparathyroidism of renal origin: Secondary | ICD-10-CM | POA: Diagnosis not present

## 2021-06-05 DIAGNOSIS — R197 Diarrhea, unspecified: Secondary | ICD-10-CM | POA: Diagnosis not present

## 2021-06-05 DIAGNOSIS — N186 End stage renal disease: Secondary | ICD-10-CM | POA: Diagnosis not present

## 2021-06-06 ENCOUNTER — Ambulatory Visit (HOSPITAL_COMMUNITY)
Admission: RE | Admit: 2021-06-06 | Discharge: 2021-06-06 | Disposition: A | Discharge status: Home / Self Care | Payer: BC Managed Care – PPO | Source: Ambulatory Visit | Attending: Internal Medicine | Admitting: Internal Medicine

## 2021-06-06 ENCOUNTER — Encounter (HOSPITAL_COMMUNITY): Payer: Self-pay

## 2021-06-06 ENCOUNTER — Other Ambulatory Visit: Payer: Self-pay

## 2021-06-06 DIAGNOSIS — R9439 Abnormal result of other cardiovascular function study: Secondary | ICD-10-CM | POA: Insufficient documentation

## 2021-06-06 DIAGNOSIS — I700 Atherosclerosis of aorta: Secondary | ICD-10-CM | POA: Diagnosis not present

## 2021-06-06 MED ORDER — IOHEXOL 350 MG/ML SOLN
95.0000 mL | Freq: Once | INTRAVENOUS | Status: AC | PRN
  Administered 2021-06-06: 95 mL via INTRAVENOUS

## 2021-06-06 MED ORDER — NITROGLYCERIN 0.4 MG SL SUBL
SUBLINGUAL_TABLET | SUBLINGUAL | Status: AC
  Filled 2021-06-06: qty 2

## 2021-06-06 MED ORDER — NITROGLYCERIN 0.4 MG SL SUBL
0.8000 mg | SUBLINGUAL_TABLET | Freq: Once | SUBLINGUAL | Status: AC
  Administered 2021-06-06: 0.8 mg via SUBLINGUAL

## 2021-06-06 NOTE — Progress Notes (Signed)
CT scan completed. Tolerated well. D/C home ambulatory with sister. Awake and alert. In no distress

## 2021-06-07 DIAGNOSIS — Z992 Dependence on renal dialysis: Secondary | ICD-10-CM | POA: Diagnosis not present

## 2021-06-07 DIAGNOSIS — N2581 Secondary hyperparathyroidism of renal origin: Secondary | ICD-10-CM | POA: Diagnosis not present

## 2021-06-07 DIAGNOSIS — R197 Diarrhea, unspecified: Secondary | ICD-10-CM | POA: Diagnosis not present

## 2021-06-07 DIAGNOSIS — N186 End stage renal disease: Secondary | ICD-10-CM | POA: Diagnosis not present

## 2021-06-10 DIAGNOSIS — N186 End stage renal disease: Secondary | ICD-10-CM | POA: Diagnosis not present

## 2021-06-10 DIAGNOSIS — N2581 Secondary hyperparathyroidism of renal origin: Secondary | ICD-10-CM | POA: Diagnosis not present

## 2021-06-10 DIAGNOSIS — Z992 Dependence on renal dialysis: Secondary | ICD-10-CM | POA: Diagnosis not present

## 2021-06-12 DIAGNOSIS — N186 End stage renal disease: Secondary | ICD-10-CM | POA: Diagnosis not present

## 2021-06-12 DIAGNOSIS — N2581 Secondary hyperparathyroidism of renal origin: Secondary | ICD-10-CM | POA: Diagnosis not present

## 2021-06-12 DIAGNOSIS — Z992 Dependence on renal dialysis: Secondary | ICD-10-CM | POA: Diagnosis not present

## 2021-06-14 DIAGNOSIS — N186 End stage renal disease: Secondary | ICD-10-CM | POA: Diagnosis not present

## 2021-06-14 DIAGNOSIS — Z992 Dependence on renal dialysis: Secondary | ICD-10-CM | POA: Diagnosis not present

## 2021-06-14 DIAGNOSIS — N2581 Secondary hyperparathyroidism of renal origin: Secondary | ICD-10-CM | POA: Diagnosis not present

## 2021-06-17 DIAGNOSIS — N186 End stage renal disease: Secondary | ICD-10-CM | POA: Diagnosis not present

## 2021-06-17 DIAGNOSIS — Z992 Dependence on renal dialysis: Secondary | ICD-10-CM | POA: Diagnosis not present

## 2021-06-17 DIAGNOSIS — N2581 Secondary hyperparathyroidism of renal origin: Secondary | ICD-10-CM | POA: Diagnosis not present

## 2021-06-18 ENCOUNTER — Ambulatory Visit: Payer: BC Managed Care – PPO | Admitting: Gastroenterology

## 2021-06-18 ENCOUNTER — Encounter: Payer: Self-pay | Admitting: Gastroenterology

## 2021-06-18 VITALS — BP 174/72 | HR 70 | Ht 63.0 in | Wt 170.0 lb

## 2021-06-18 DIAGNOSIS — R1310 Dysphagia, unspecified: Secondary | ICD-10-CM

## 2021-06-18 DIAGNOSIS — K219 Gastro-esophageal reflux disease without esophagitis: Secondary | ICD-10-CM | POA: Diagnosis not present

## 2021-06-18 DIAGNOSIS — K582 Mixed irritable bowel syndrome: Secondary | ICD-10-CM

## 2021-06-18 DIAGNOSIS — R112 Nausea with vomiting, unspecified: Secondary | ICD-10-CM

## 2021-06-18 MED ORDER — DEXLANSOPRAZOLE 60 MG PO CPDR: 60.0000 mg | DELAYED_RELEASE_CAPSULE | Freq: Every day | ORAL | 3 refills | Status: DC

## 2021-06-18 NOTE — Progress Notes (Signed)
Alison Baker    [id number]    1956-02-12  Primary Care Physician:Yates, Remigio Eisenmenger, PA  Referring Physician: Renaldo Reel, PA [place],  [place] [id number]   Chief complaint: Diarrhea, nausea, dysphagia  HPI: 66 year old very pleasant female with history of end-stage renal disease on dialysis, previously followed by Dr. Henrene Pastor is here for new patient visit for second opinion  She is no longer having diarrhea after she was on a course of antibiotics.  She did not tolerate Metamucil.  She developed constipation with Questran and has since stopped taking it.  She uses Imodium daily as needed specially when she goes in for her dialysis to prevent any episodes of fecal incontinence or diarrhea.  She is experiencing more difficulty swallowing, feels the food gets hung up in her chest and she has to regurgitate it back, had an episode recently where she felt a piece of steak getting hung up when she was in a restaurant and had to vomit it back up She has trouble mostly with meat and bread.  No difficulty swallowing liquids or pills.  She is taking daily omeprazole.  She has breakthrough heartburn intermittently and indigestion.  She is s/p cholecystectomy in 2018.  She is currently undergoing work-up for renal transplant  EGD 05/07/2018 - One benign-appearing, intrinsic moderate stenosis was found 38 cm from the incisors. This stenosis measured 1.5 cm (inner diameter). There was also associated esophagitis as manifested by erythema, mild friability, and edema. The scope was withdrawn. Dilation was performed with a Maloney dilator with no resistance at 77 Fr.  Colonoscopy 05/07/2018 - One 5 mm polyp in the cecum, removed with a cold snare. Resected and retrieved. - Diverticulosis in the sigmoid colon. - The examination was otherwise normal on direct and retroflexion views.   CTA coronary June 06, 2021 1. 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  2. Coronary calcium score of 146. This was 88th percentile for age and sex matched control. 3. Normal coronary origin with right dominance. 4. Dilated main pulmonary artery at 28 mm, suggestive of pulmonary hypertension. 5. Aortic atherosclerosis and aortic valve annular calcification. 6. Aggressive cardiovascular risk factor modification is recommended.  Outpatient Encounter Medications as of 06/18/2021  Medication Sig   acetaminophen (TYLENOL) 500 MG tablet Take 500 mg by mouth 3 (three) times daily as needed for headache.   amLODipine (NORVASC) 10 MG tablet Take 10 mg by mouth See admin instructions. Takes on non dialysis days, Sunday, Tuesday, Thursday and Saturday takes at bedtime   atorvastatin (LIPITOR) 40 MG tablet Take 1 tablet (40 mg total) by mouth daily.   fluticasone (FLONASE) 50 MCG/ACT nasal spray Place into both nostrils daily.   folic acid-vitamin b complex-vitamin c-selenium-zinc (DIALYVITE) 3 MG TABS tablet Take 1 tablet by mouth daily.   labetalol (NORMODYNE) 100 MG tablet Take 1 tablet by mouth daily.   meclizine (ANTIVERT) 25 MG tablet Take 25 mg by mouth 3 (three) times daily as needed for dizziness.   metoprolol tartrate (LOPRESSOR) 100 MG tablet Take 1 tablet (100 mg total) by mouth once for 1 dose. PLEASE TAKE METOPROLOL 2  HOURS PRIOR TO CTA SCAN.   omeprazole (PRILOSEC) 40 MG capsule Take 1 capsule (40 mg total) by mouth daily.   ondansetron (ZOFRAN-ODT) 8 MG disintegrating tablet Take 8 mg by mouth 3 (three) times daily.   No facility-administered encounter medications  on file as of 06/18/2021.    Allergies as of 06/18/2021 - Review Complete 06/06/2021  Allergen Reaction Noted   Nsaids Other (See Comments) 05/23/2015   Penicillins Rash and Other (See Comments) 08/29/2015   Strawberry extract Swelling 09/07/2015   Clonidine Nausea And Vomiting 05/23/2015   Sulfa antibiotics Rash and Other (See Comments) 07/02/2016    Past Medical  History:  Diagnosis Date   Anemia    Anxiety    Complication of anesthesia    slow to awaken, sentive to medications rarely even takes a Tylenol.   Diarrhea    End-stage kidney disease ([place])    M/W/F- [place]   Gastric ulcer with hemorrhage    GERD (gastroesophageal reflux disease)    Hyperlipidemia    Hypertension    Pneumonia    PONV (postoperative nausea and vomiting)    after hysterectomy- 1980's   Pre-diabetes    Umbilical hernia     Past Surgical History:  Procedure Laterality Date   APPENDECTOMY     BASCILIC VEIN TRANSPOSITION Left 07/15/2016   Procedure: BRACHIAL VEIN TRANSPOSITION FIRST STAGE;  Surgeon: Conrad Washington [place], MD;  Location: [facility];  Service: Vascular;  Laterality: Left;   [place] Left 11/04/2016   Procedure: SECOND STAGE BRACHIAL VEIN TRANSPOSITION;  Surgeon: Conrad [place], MD;  Location: [facility];  Service: Vascular;  Laterality: Left;   CHOLECYSTECTOMY     partial hepatectomy   COLONOSCOPY W/ POLYPECTOMY     EYE SURGERY Right    catarct   EYE SURGERY Left 08/2016   cataract   INSERTION OF DIALYSIS CATHETER     IR GENERIC HISTORICAL  06/23/2016   IR US GUIDE VASC ACCESS RIGHT 06/23/2016 [facility], [facility]   IR GENERIC HISTORICAL  06/23/2016   IR FLUORO GUIDE CV LINE RIGHT 06/23/2016 [facility], [facility]   TOTAL ABDOMINAL HYSTERECTOMY W/ BILATERAL SALPINGOOPHORECTOMY     TRABECULECTOMY Bilateral     Family History  Problem Relation Age of Onset   Hypertension Mother    Cancer Sister        vulvular cancer -then mets to brain, lung   Colon cancer Neg Hx    Esophageal cancer Neg Hx    Stomach cancer Neg Hx    Pancreatic cancer Neg Hx    Liver disease Neg Hx     Social History   Socioeconomic History   Marital status: Married    Spouse name: Not on file   Number of children: Not on file   Years of education: Not on file   Highest education level: Not on file  Occupational History   Not on file  Tobacco  Use   Smoking status: Former    Types: Cigarettes    Quit date: 06/27/1995    Years since quitting: 25.9   Smokeless tobacco: Never  Vaping Use   Vaping Use: Never used  Substance and Sexual Activity   Alcohol use: No   Drug use: No   Sexual activity: Not on file  Other Topics Concern   Not on file  Social History Narrative   Not on file   Social Determinants of Health   Financial Resource Strain: Not on file  Food Insecurity: Not on file  Transportation Needs: Not on file  Physical Activity: Not on file  Stress: Not on file  Social Connections: Not on file  Intimate Partner Violence: Not on file      Review of systems: All other review  of systems negative except as mentioned in the HPI.   Physical Exam: Vitals:   06/18/21 0852  BP: (!) 174/72  Pulse: 70   Body mass index is 30.11 kg/m. Gen:      No acute distress HEENT:  sclera anicteric Abd:      soft, large ventral hernia, reducible, non-tender; no palpable masses, no distension Ext:    No edema Neuro: alert and oriented x 3 Psych: normal mood and affect  Data Reviewed:  Reviewed labs, radiology imaging, old records and pertinent past GI work up   Assessment and Plan/Recommendations:  66 year old very pleasant female with history of end-stage renal disease on dialysis, hypertension, chronic GERD with complaints of solid dysphagia.  Patient wants to hold off invasive procedure or EGD at this point  Schedule for barium esophagram to further evaluate dysphagia symptoms, exclude peptic stricture, erosive esophagitis or mass lesion  Uncontrolled GERD and nausea Switch to Dexilant 60 mg daily for better control of acid reflux and improve erosive esophagitis if present Stop omeprazole Antireflux measures  Reviewed EGD from 3 years ago, had esophageal peptic stricture that was dilated with Venia Minks  If continues to have symptoms of dysphagia, will plan to proceed with EGD based on findings on barium  esophagram  IBS with constipation and diarrhea Use Colace 1 capsule at bedtime as needed, advised patient to skip it the night before dialysis to prevent episodes of diarrhea and it is okay to use Imodium as needed but advised patient to avoid using it regularly  Large abdominal wall/ventral hernia: Use abdominal wall binder during activity and follow-up with surgery  Return in 1 to 2 months  This visit required 60 minutes of patient care (this includes precharting, chart review, review of results, face-to-face time used for counseling as well as treatment plan and follow-up. The patient was provided an opportunity to ask questions and all were answered. The patient agreed with the plan and demonstrated an understanding of the instructions.  Damaris Hippo , MD    CC: Renaldo Reel, PA

## 2021-06-18 NOTE — Patient Instructions (Signed)
You have been scheduled for a Barium Esophogram at [facility] (1st floor of the hospital) on 06/24/2021 at 11:00 am . Please arrive 30  minutes prior to your appointment for registration. Make certain not to have anything to eat or drink 3 hours prior to your test. If you need to reschedule for any reason, please contact radiology at [phone] to do so. __________________________________________________________________ A barium swallow is an examination that concentrates on views of the esophagus. This tends to be a double contrast exam (barium and two liquids which, when combined, create a gas to distend the wall of the oesophagus) or single contrast (non-ionic iodine based). The study is usually tailored to your symptoms so a good history is essential. Attention is paid during the study to the form, structure and configuration of the esophagus, looking for functional disorders (such as aspiration, dysphagia, achalasia, motility and reflux) EXAMINATION You may be asked to change into a gown, depending on the type of swallow being performed. A radiologist and radiographer will perform the procedure. The radiologist will advise you of the type of contrast selected for your procedure and direct you during the exam. You will be asked to stand, sit or lie in several different positions and to hold a small amount of fluid in your mouth before being asked to swallow while the imaging is performed .In some instances you may be asked to swallow barium coated marshmallows to assess the motility of a solid food bolus. The exam can be recorded as a digital or video fluoroscopy procedure. POST PROCEDURE It will take 1-2 days for the barium to pass through your system. To facilitate this, it is important, unless otherwise directed, to increase your fluids for the next 24-48hrs and to resume your normal diet.  This test typically takes about 30 minutes to  perform. __________________________________________________________________________________   Use Abdominal binder  Use Colace (Docusate) 1 capsule at bedtime as needed (Skip taking it the night before dialysis)  We will send Dexilant to your pharmacy   STOP Omeprazole  Due to recent changes in healthcare laws, you may see the results of your imaging and laboratory studies on MyChart before your provider has had a chance to review them.  We understand that in some cases there may be results that are confusing or concerning to you. Not all laboratory results come back in the same time frame and the provider may be waiting for multiple results in order to interpret others.  Please give [place] 48 hours in order for your provider to thoroughly review all the results before contacting the office for clarification of your results.    If you are age 59 or older, your body mass index should be between 23-30. Your Body mass index is 30.11 kg/m. If this is out of the aforementioned range listed, please consider follow up with your Primary Care Provider.  If you are age 53 or younger, your body mass index should be between 19-25. Your Body mass index is 30.11 kg/m. If this is out of the aformentioned range listed, please consider follow up with your Primary Care Provider.   ________________________________________________________  The [place] [facility] providers would like to encourage you to use [facility] to communicate with providers for non-urgent requests or questions.  Due to long hold times on the telephone, sending your provider a message by [facility] may be a faster and more efficient way to get a response.  Please allow 48 business hours for a response.  Please remember that this  is for non-urgent requests.  _______________________________________________________   Thank you for choosing [facility]  Kavitha Nandigam,MD

## 2021-06-19 DIAGNOSIS — N186 End stage renal disease: Secondary | ICD-10-CM | POA: Diagnosis not present

## 2021-06-19 DIAGNOSIS — N2581 Secondary hyperparathyroidism of renal origin: Secondary | ICD-10-CM | POA: Diagnosis not present

## 2021-06-19 DIAGNOSIS — Z992 Dependence on renal dialysis: Secondary | ICD-10-CM | POA: Diagnosis not present

## 2021-06-22 DIAGNOSIS — Z992 Dependence on renal dialysis: Secondary | ICD-10-CM | POA: Diagnosis not present

## 2021-06-22 DIAGNOSIS — N186 End stage renal disease: Secondary | ICD-10-CM | POA: Diagnosis not present

## 2021-06-22 DIAGNOSIS — N2581 Secondary hyperparathyroidism of renal origin: Secondary | ICD-10-CM | POA: Diagnosis not present

## 2021-06-24 ENCOUNTER — Ambulatory Visit (HOSPITAL_COMMUNITY)
Admission: RE | Admit: 2021-06-24 | Discharge: 2021-06-24 | Disposition: A | Discharge status: Home / Self Care | Payer: BC Managed Care – PPO | Source: Ambulatory Visit | Attending: Gastroenterology | Admitting: Gastroenterology

## 2021-06-24 ENCOUNTER — Other Ambulatory Visit: Payer: Self-pay

## 2021-06-24 DIAGNOSIS — R112 Nausea with vomiting, unspecified: Secondary | ICD-10-CM | POA: Diagnosis not present

## 2021-06-24 DIAGNOSIS — K219 Gastro-esophageal reflux disease without esophagitis: Secondary | ICD-10-CM | POA: Insufficient documentation

## 2021-06-24 DIAGNOSIS — K449 Diaphragmatic hernia without obstruction or gangrene: Secondary | ICD-10-CM | POA: Diagnosis not present

## 2021-06-24 DIAGNOSIS — K582 Mixed irritable bowel syndrome: Secondary | ICD-10-CM | POA: Insufficient documentation

## 2021-06-24 DIAGNOSIS — R1310 Dysphagia, unspecified: Secondary | ICD-10-CM | POA: Insufficient documentation

## 2021-06-25 DIAGNOSIS — I129 Hypertensive chronic kidney disease with stage 1 through stage 4 chronic kidney disease, or unspecified chronic kidney disease: Secondary | ICD-10-CM | POA: Diagnosis not present

## 2021-06-25 DIAGNOSIS — N2581 Secondary hyperparathyroidism of renal origin: Secondary | ICD-10-CM | POA: Diagnosis not present

## 2021-06-25 DIAGNOSIS — Z992 Dependence on renal dialysis: Secondary | ICD-10-CM | POA: Diagnosis not present

## 2021-06-25 DIAGNOSIS — N186 End stage renal disease: Secondary | ICD-10-CM | POA: Diagnosis not present

## 2021-06-26 DIAGNOSIS — N186 End stage renal disease: Secondary | ICD-10-CM | POA: Diagnosis not present

## 2021-06-26 DIAGNOSIS — Z992 Dependence on renal dialysis: Secondary | ICD-10-CM | POA: Diagnosis not present

## 2021-06-26 DIAGNOSIS — N2581 Secondary hyperparathyroidism of renal origin: Secondary | ICD-10-CM | POA: Diagnosis not present

## 2021-06-28 DIAGNOSIS — N2581 Secondary hyperparathyroidism of renal origin: Secondary | ICD-10-CM | POA: Diagnosis not present

## 2021-06-28 DIAGNOSIS — Z992 Dependence on renal dialysis: Secondary | ICD-10-CM | POA: Diagnosis not present

## 2021-06-28 DIAGNOSIS — N186 End stage renal disease: Secondary | ICD-10-CM | POA: Diagnosis not present

## 2021-07-01 DIAGNOSIS — N2581 Secondary hyperparathyroidism of renal origin: Secondary | ICD-10-CM | POA: Diagnosis not present

## 2021-07-01 DIAGNOSIS — Z992 Dependence on renal dialysis: Secondary | ICD-10-CM | POA: Diagnosis not present

## 2021-07-01 DIAGNOSIS — N186 End stage renal disease: Secondary | ICD-10-CM | POA: Diagnosis not present

## 2021-07-01 DIAGNOSIS — R197 Diarrhea, unspecified: Secondary | ICD-10-CM | POA: Diagnosis not present

## 2021-07-03 DIAGNOSIS — Z992 Dependence on renal dialysis: Secondary | ICD-10-CM | POA: Diagnosis not present

## 2021-07-03 DIAGNOSIS — N186 End stage renal disease: Secondary | ICD-10-CM | POA: Diagnosis not present

## 2021-07-03 DIAGNOSIS — R197 Diarrhea, unspecified: Secondary | ICD-10-CM | POA: Diagnosis not present

## 2021-07-03 DIAGNOSIS — N2581 Secondary hyperparathyroidism of renal origin: Secondary | ICD-10-CM | POA: Diagnosis not present

## 2021-07-04 ENCOUNTER — Telehealth: Payer: Self-pay | Admitting: Gastroenterology

## 2021-07-04 NOTE — Telephone Encounter (Signed)
Inbound call from patient requesting barium swallow results please. ?

## 2021-07-04 NOTE — Telephone Encounter (Signed)
Called patient and gave Dr. Woodward Ku results from swallow study, and other recommendations. ?

## 2021-07-04 NOTE — Telephone Encounter (Signed)
Returned patient's call and let her know the Barium swallow study was normal, but Dr. Silverio Decamp has not had a chance to review it and give any recommendations. She wanted Dr. Silverio Decamp to know that every time she eats anything, her head brakes out in a sweat to the extent that it drips down her neck and face. This has been going on for a long time, but has gotten worse lately. She forgot to tell Dr. Silverio Decamp at her office visit. Patient wanted to know if this is GI related, since it only happens when she eats. Also if we would know the cause. Please advise. ?

## 2021-07-04 NOTE — Telephone Encounter (Signed)
Barium swallow study did not show any significant abnormality other than small hiatal hernia, overall was unremarkable exam. ?I do not believe excessive sweating is related to GI, please advise patient to discuss with PCP.  May consider appropriate referral for further evaluation if it is a persistent symptom. ?

## 2021-07-05 DIAGNOSIS — N186 End stage renal disease: Secondary | ICD-10-CM | POA: Diagnosis not present

## 2021-07-05 DIAGNOSIS — N2581 Secondary hyperparathyroidism of renal origin: Secondary | ICD-10-CM | POA: Diagnosis not present

## 2021-07-05 DIAGNOSIS — Z992 Dependence on renal dialysis: Secondary | ICD-10-CM | POA: Diagnosis not present

## 2021-07-05 DIAGNOSIS — R197 Diarrhea, unspecified: Secondary | ICD-10-CM | POA: Diagnosis not present

## 2021-07-08 DIAGNOSIS — Z992 Dependence on renal dialysis: Secondary | ICD-10-CM | POA: Diagnosis not present

## 2021-07-08 DIAGNOSIS — N186 End stage renal disease: Secondary | ICD-10-CM | POA: Diagnosis not present

## 2021-07-08 DIAGNOSIS — N2581 Secondary hyperparathyroidism of renal origin: Secondary | ICD-10-CM | POA: Diagnosis not present

## 2021-07-10 DIAGNOSIS — N2581 Secondary hyperparathyroidism of renal origin: Secondary | ICD-10-CM | POA: Diagnosis not present

## 2021-07-10 DIAGNOSIS — N186 End stage renal disease: Secondary | ICD-10-CM | POA: Diagnosis not present

## 2021-07-10 DIAGNOSIS — Z992 Dependence on renal dialysis: Secondary | ICD-10-CM | POA: Diagnosis not present

## 2021-07-12 DIAGNOSIS — N2581 Secondary hyperparathyroidism of renal origin: Secondary | ICD-10-CM | POA: Diagnosis not present

## 2021-07-12 DIAGNOSIS — Z992 Dependence on renal dialysis: Secondary | ICD-10-CM | POA: Diagnosis not present

## 2021-07-12 DIAGNOSIS — N186 End stage renal disease: Secondary | ICD-10-CM | POA: Diagnosis not present

## 2021-07-15 DIAGNOSIS — Z992 Dependence on renal dialysis: Secondary | ICD-10-CM | POA: Diagnosis not present

## 2021-07-15 DIAGNOSIS — N186 End stage renal disease: Secondary | ICD-10-CM | POA: Diagnosis not present

## 2021-07-15 DIAGNOSIS — N2581 Secondary hyperparathyroidism of renal origin: Secondary | ICD-10-CM | POA: Diagnosis not present

## 2021-07-17 DIAGNOSIS — Z992 Dependence on renal dialysis: Secondary | ICD-10-CM | POA: Diagnosis not present

## 2021-07-17 DIAGNOSIS — N2581 Secondary hyperparathyroidism of renal origin: Secondary | ICD-10-CM | POA: Diagnosis not present

## 2021-07-17 DIAGNOSIS — N186 End stage renal disease: Secondary | ICD-10-CM | POA: Diagnosis not present

## 2021-07-19 DIAGNOSIS — N2581 Secondary hyperparathyroidism of renal origin: Secondary | ICD-10-CM | POA: Diagnosis not present

## 2021-07-19 DIAGNOSIS — N186 End stage renal disease: Secondary | ICD-10-CM | POA: Diagnosis not present

## 2021-07-19 DIAGNOSIS — Z992 Dependence on renal dialysis: Secondary | ICD-10-CM | POA: Diagnosis not present

## 2021-07-22 DIAGNOSIS — N2581 Secondary hyperparathyroidism of renal origin: Secondary | ICD-10-CM | POA: Diagnosis not present

## 2021-07-22 DIAGNOSIS — N186 End stage renal disease: Secondary | ICD-10-CM | POA: Diagnosis not present

## 2021-07-22 DIAGNOSIS — Z992 Dependence on renal dialysis: Secondary | ICD-10-CM | POA: Diagnosis not present

## 2021-07-24 DIAGNOSIS — N2581 Secondary hyperparathyroidism of renal origin: Secondary | ICD-10-CM | POA: Diagnosis not present

## 2021-07-24 DIAGNOSIS — N186 End stage renal disease: Secondary | ICD-10-CM | POA: Diagnosis not present

## 2021-07-24 DIAGNOSIS — Z992 Dependence on renal dialysis: Secondary | ICD-10-CM | POA: Diagnosis not present

## 2021-07-26 DIAGNOSIS — I129 Hypertensive chronic kidney disease with stage 1 through stage 4 chronic kidney disease, or unspecified chronic kidney disease: Secondary | ICD-10-CM | POA: Diagnosis not present

## 2021-07-26 DIAGNOSIS — N186 End stage renal disease: Secondary | ICD-10-CM | POA: Diagnosis not present

## 2021-07-26 DIAGNOSIS — Z992 Dependence on renal dialysis: Secondary | ICD-10-CM | POA: Diagnosis not present

## 2021-07-26 DIAGNOSIS — N2581 Secondary hyperparathyroidism of renal origin: Secondary | ICD-10-CM | POA: Diagnosis not present

## 2021-07-29 DIAGNOSIS — N186 End stage renal disease: Secondary | ICD-10-CM | POA: Diagnosis not present

## 2021-07-29 DIAGNOSIS — Z992 Dependence on renal dialysis: Secondary | ICD-10-CM | POA: Diagnosis not present

## 2021-07-29 DIAGNOSIS — N2581 Secondary hyperparathyroidism of renal origin: Secondary | ICD-10-CM | POA: Diagnosis not present

## 2021-07-31 DIAGNOSIS — N2581 Secondary hyperparathyroidism of renal origin: Secondary | ICD-10-CM | POA: Diagnosis not present

## 2021-07-31 DIAGNOSIS — Z992 Dependence on renal dialysis: Secondary | ICD-10-CM | POA: Diagnosis not present

## 2021-07-31 DIAGNOSIS — N186 End stage renal disease: Secondary | ICD-10-CM | POA: Diagnosis not present

## 2021-08-02 DIAGNOSIS — N2581 Secondary hyperparathyroidism of renal origin: Secondary | ICD-10-CM | POA: Diagnosis not present

## 2021-08-02 DIAGNOSIS — Z992 Dependence on renal dialysis: Secondary | ICD-10-CM | POA: Diagnosis not present

## 2021-08-02 DIAGNOSIS — N186 End stage renal disease: Secondary | ICD-10-CM | POA: Diagnosis not present

## 2021-08-05 DIAGNOSIS — Z992 Dependence on renal dialysis: Secondary | ICD-10-CM | POA: Diagnosis not present

## 2021-08-05 DIAGNOSIS — N2581 Secondary hyperparathyroidism of renal origin: Secondary | ICD-10-CM | POA: Diagnosis not present

## 2021-08-05 DIAGNOSIS — R197 Diarrhea, unspecified: Secondary | ICD-10-CM | POA: Diagnosis not present

## 2021-08-05 DIAGNOSIS — N186 End stage renal disease: Secondary | ICD-10-CM | POA: Diagnosis not present

## 2021-08-07 DIAGNOSIS — R197 Diarrhea, unspecified: Secondary | ICD-10-CM | POA: Diagnosis not present

## 2021-08-07 DIAGNOSIS — N186 End stage renal disease: Secondary | ICD-10-CM | POA: Diagnosis not present

## 2021-08-07 DIAGNOSIS — Z992 Dependence on renal dialysis: Secondary | ICD-10-CM | POA: Diagnosis not present

## 2021-08-07 DIAGNOSIS — N2581 Secondary hyperparathyroidism of renal origin: Secondary | ICD-10-CM | POA: Diagnosis not present

## 2021-08-09 DIAGNOSIS — N2581 Secondary hyperparathyroidism of renal origin: Secondary | ICD-10-CM | POA: Diagnosis not present

## 2021-08-09 DIAGNOSIS — N186 End stage renal disease: Secondary | ICD-10-CM | POA: Diagnosis not present

## 2021-08-09 DIAGNOSIS — Z992 Dependence on renal dialysis: Secondary | ICD-10-CM | POA: Diagnosis not present

## 2021-08-09 DIAGNOSIS — R197 Diarrhea, unspecified: Secondary | ICD-10-CM | POA: Diagnosis not present

## 2021-08-12 ENCOUNTER — Telehealth: Payer: Self-pay | Admitting: Gastroenterology

## 2021-08-12 ENCOUNTER — Telehealth: Payer: Self-pay | Admitting: Internal Medicine

## 2021-08-12 DIAGNOSIS — N186 End stage renal disease: Secondary | ICD-10-CM | POA: Diagnosis not present

## 2021-08-12 DIAGNOSIS — Z992 Dependence on renal dialysis: Secondary | ICD-10-CM | POA: Diagnosis not present

## 2021-08-12 DIAGNOSIS — N2581 Secondary hyperparathyroidism of renal origin: Secondary | ICD-10-CM | POA: Diagnosis not present

## 2021-08-12 NOTE — Telephone Encounter (Signed)
We can try to switch the appointment to a different date that does not conflict with her dialysis but do not recommend doing a telephone visit.  Thank you ?

## 2021-08-12 NOTE — Telephone Encounter (Signed)
Inbound call from patient stating that she wanted to see if there was anyway she could change her appointment on 4/21 at 10:40 to a virtual due to having dialysis on the same day. Patient is requesting a call back to discuss. Please advise.  ? ? ?[phone] ?

## 2021-08-12 NOTE — Telephone Encounter (Signed)
Spoke to patient. Patient states she would like to have a telephone call visit only - She states she does not have computer or  smartphone- can do phone calls. ? Patient states she is trying to get on the transplant list for a Kidney . ( 6 years on dialysis)  ? RN reviewed with patient the  direction on the process of virtual call. ?Please have vital sins a complete prior to appointment time. ? Patient verbalized understanding. ?

## 2021-08-12 NOTE — Telephone Encounter (Signed)
Patient calling to request her appointment 4/18 be virtual. She states she does not want to drive to [facility] if it's just a follow up, because she was told she was fine. She states she cannot do a video so it would have to be a phone call. ?

## 2021-08-13 ENCOUNTER — Telehealth (INDEPENDENT_AMBULATORY_CARE_PROVIDER_SITE_OTHER): Payer: BC Managed Care – PPO | Admitting: Internal Medicine

## 2021-08-13 ENCOUNTER — Ambulatory Visit: Payer: BC Managed Care – PPO | Admitting: Cardiology

## 2021-08-13 VITALS — BP 147/69 | HR 61 | Ht 63.0 in | Wt 169.0 lb

## 2021-08-13 DIAGNOSIS — Z01810 Encounter for preprocedural cardiovascular examination: Secondary | ICD-10-CM

## 2021-08-13 NOTE — Patient Instructions (Signed)
Medication Instructions:  ?Your Physician recommend you continue on your current medication as directed.   ? ?*If you need a refill on your cardiac medications before your next appointment, please call your pharmacy* ? ? ?Lab Work: ?None ordered today ? ? ?Testing/Procedures: ?None ordered today ? ? ?Follow-Up: ?At [facility], you and your health needs are our priority.  As part of our continuing mission to provide you with exceptional heart care, we have created designated Provider Care Teams.  These Care Teams include your primary Cardiologist (physician) and Advanced Practice Providers (APPs -  Physician Assistants and Nurse Practitioners) who all work together to provide you with the care you need, when you need it. ? ?We recommend signing up for the patient portal called "MyChart".  Sign up information is provided on this After Visit Summary.  MyChart is used to connect with patients for Virtual Visits (Telemedicine).  Patients are able to view lab/test results, encounter notes, upcoming appointments, etc.  Non-urgent messages can be sent to your provider as well.   ?To learn more about what you can do with MyChart, go to [email or web address].   ? ?Your next appointment:   ?As needed  ? ?The format for your next appointment:   ?In Person ? ?Provider:   ?Janina Mayo, MD { ? ? ?Important Information About Sugar ? ? ? ? ? ? ?

## 2021-08-13 NOTE — Progress Notes (Signed)
? ?Virtual Visit via Telephone Note  ? ?This visit type was conducted due to national recommendations for restrictions regarding the COVID-19 Pandemic (e.g. social distancing) in an effort to limit this patient's exposure and mitigate transmission in our community.  Due to her co-morbid illnesses, this patient is at least at moderate risk for complications without adequate follow up.  This format is felt to be most appropriate for this patient at this time.  The patient did not have access to video technology/had technical difficulties with video requiring transitioning to audio format only (telephone).  All issues noted in this document were discussed and addressed.  No physical exam could be performed with this format.  Please refer to the patient's chart for her  consent to telehealth for [facility].  ? ? ?Date:  08/13/2021  ? ?ID:  Alison Baker, DOB Jul 17, 1955, MRN [id number] ?The patient was identified using 2 identifiers. ? ?Patient Location: Home ? ? ? ?PCP:  Renaldo Reel, PA ?  ?[facility] Prov[facility] ?Cardiologist:  Janina Mayo, MD    ? ?Evaluation Performed:  Follow-Up Visit ? ?Chief Complaint:  Follow up ? ?History of Present Illness:   ? ?Alison Baker is a 66 y.o. female with a hx of  ESRD MWF started in 2018, former smoker, no known cardiac dx hx, prediabetes, referral for renal Transplant CVD risk assessment ?  ?Per the records from [place]: ?  ?She had a stress echo in July 2022 that was non diagnostic because she did not reach max predicted HR (only 48%) with high doses of labetalol with significant hypertension. There was normal wall motion. Considering study was non diagnostic she underwent lexiscan. ?  ?The EKG portion of her lexiscan stress was non diagnostic due to baseline ST depressions on 03/28/2021.  Further she was noted to have profound blood pressure drop during the stress portion of the lexiscan from 204/82 mmHg to 122/60 mmHg. She had transient chest burning, flushing,  abdominal pain as well as nausea and vomiting. The SPECT was read as EF 65%, EDV 129 cc. Mild inferolateral attenuation v scar (worse on stress then rest images). Normal wall motion. ?  ?Echo at this time showed EF 55-60%. No diastolic dysfunction.  No valve dx  Her echo was normal in 2018. There was c/f possible PFO ?  ?She was referred here due to non diagnostic studies and significant hypotension during lexiscan. ?  ?Today, she denies chest pain or SOB. She gets dialysis MWF. She smoked for 20 years in the past. No known cardiac dx. Kidney dx she was told 2/2 diabetes but A1c have not met criteria. She has poorly controlled HTN ? ?Interim Hx: ?Noted that she can hold of statin therapy with mixed data for IHD patients. CAC score not significantly high. She states the facility did not want to consider statin. Otherwise, she had no obstructive lesions. Noted bruit on exam but no significant stenosis. She wanted to do a telephone visit today. She is doing well with no issues. No chest pain or SOB. ? ?Coronary CTA 06/06/2021 ?IMPRESSION: ?1. 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 ?  ?2. Coronary calcium score of 146. This was 88th percentile for age ?and sex matched control. ?  ?3. Normal coronary origin with right dominance. ?  ?4. 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 ?  ?5. Aortic atherosclerosis and aortic valve annular calcification. ?  ?6. Aggressive cardiovascular risk factor modification  is ?recommended. ? ?Carotid US - no significant obstruction ? ?Past Medical History:  ?Diagnosis Date  ? Anemia   ? Anxiety   ? Complication of anesthesia   ? slow to awaken, sentive to medications rarely even takes a Tylenol.  ? Diarrhea   ? End-stage kidney disease ([place])   ? M/W/F- [place]  ? Gastric ulcer with hemorrhage   ? GERD (gastroesophageal reflux disease)   ? Hyperlipidemia   ? Hypertension   ? Pneumonia   ? PONV (postoperative nausea and  vomiting)   ? after hysterectomy- 1980's  ? Pre-diabetes   ? Umbilical hernia   ? ?Past Surgical History:  ?Procedure Laterality Date  ? APPENDECTOMY    ? BASCILIC VEIN TRANSPOSITION Left 07/15/2016  ? Procedure: BRACHIAL VEIN TRANSPOSITION FIRST STAGE;  Surgeon: Conrad Cunningham, MD;  Location: [facility];  Service: Vascular;  Laterality: Left;  ? BASCILIC VEIN TRANSPOSITION Left 11/04/2016  ? Procedure: SECOND STAGE BRACHIAL VEIN TRANSPOSITION;  Surgeon: Conrad , MD;  Location: [facility];  Service: Vascular;  Laterality: Left;  ? CHOLECYSTECTOMY    ? partial hepatectomy  ? COLONOSCOPY W/ POLYPECTOMY    ? EYE SURGERY Right   ? catarct  ? EYE SURGERY Left 08/2016  ? cataract  ? INSERTION OF DIALYSIS CATHETER    ? IR GENERIC HISTORICAL  06/23/2016  ? IR US GUIDE VASC ACCESS RIGHT 06/23/2016 Arne Cleveland, MD [facility]  ? IR GENERIC HISTORICAL  06/23/2016  ? IR FLUORO GUIDE CV LINE RIGHT 06/23/2016 Arne Cleveland, MD [facility]  ? TOTAL ABDOMINAL HYSTERECTOMY W/ BILATERAL SALPINGOOPHORECTOMY    ? TRABECULECTOMY Bilateral   ?  ? ?Current Meds  ?Medication Sig  ? acetaminophen (TYLENOL) 500 MG tablet Take 500 mg by mouth 3 (three) times daily as needed for headache.  ? amLODipine (NORVASC) 10 MG tablet Take 10 mg by mouth See admin instructions. Takes on non dialysis days, Sunday, Tuesday, Thursday and Saturday takes at bedtime  ? calcium carbonate (TUMS EX) 750 MG chewable tablet Chew 1 tablet by mouth daily.  ? fluticasone (FLONASE) 50 MCG/ACT nasal spray Place into both nostrils as needed.  ? folic acid-vitamin b complex-vitamin c-selenium-zinc (DIALYVITE) 3 MG TABS tablet Take 1 tablet by mouth daily.  ? labetalol (NORMODYNE) 100 MG tablet Take 1 tablet by mouth 2 (two) times daily.  ? meclizine (ANTIVERT) 25 MG tablet Take 25 mg by mouth 3 (three) times daily as needed for dizziness.  ? omeprazole (PRILOSEC) 40 MG capsule Take 40 mg by mouth daily.  ?  ? ?Allergies:   Nsaids, Penicillins, Strawberry extract, Clonidine,  and Sulfa antibiotics  ? ?Social History  ? ?Tobacco Use  ? Smoking status: Former  ?  Types: Cigarettes  ?  Quit date: 06/27/1995  ?  Years since quitting: 26.1  ? Smokeless tobacco: Never  ?Vaping Use  ? Vaping Use: Never used  ?Substance Use Topics  ? Alcohol use: No  ? Drug use: No  ?  ? ?Family Hx: ?The patient's family history includes Cancer in her sister; Hypertension in her mother. There is no history of Colon cancer, Esophageal cancer, Stomach cancer, Pancreatic cancer, or Liver disease. ? ?ROS:   ?Please see the history of present illness.    ? ?All other systems reviewed and are negative. ? ?Recent Labs: ?05/28/2021: BUN 29; Creatinine, Ser 6.40; Potassium 4.6; Sodium 142  ? ?Recent Lipid Panel ?No results found for: CHOL, TRIG, HDL, CHOLHDL, LDLCALC, LDLDIRECT ? ?Wt Readings from  Last 3 Encounters:  ?08/13/21 169 lb (76.7 kg)  ?06/18/21 170 lb (77.1 kg)  ?05/14/21 172 lb (78 kg)  ?  ? ?Objective:   ? ?Vital Signs:  BP (!) 147/69   Pulse 61   Ht '5\' 3"'  (1.6 m)   Wt 169 lb (76.7 kg)   BMI 29.94 kg/m?  NA ? ?ASSESSMENT & PLAN:   ? ?#Abnormal Stress Test: She had significant BP drop with lexiscan from 200s to 120. Nuclear study showed possible artifact inferorolaterally v scar per the records. She has carotid bruit and a former smoker. Completed CTA. No LM disease. No obstructive dx. CAC was 88th percentile. Discussed typically would consider statin however noted great hesitancy at dialysis center and there is mixed data on benefits for IHD patients. Decided to hold off. ? ?#Hypertension: per nephrology at [facility].   ?- cont labetalol 100 mg BID ?  Continue norvasc 10 mg daily ?  ?She is acceptable cardiac risk for kidney transplant. ? ?   ? ?Time:   ?Today, I have spent 15 minutes with the patient with telehealth technology discussing the above problems.   ? ? ?Medication Adjustments/Labs and Tests Ordered: ?Current medicines are reviewed at length with the patient today.  Concerns regarding medicines  are outlined above.  ? ?Tests Ordered: ?No orders of the defined types were placed in this encounter. ? ? ?Medication Changes: ?No orders of the defined types were placed in this encounter. ? ? ?Follow Up:  In Person

## 2021-08-13 NOTE — Telephone Encounter (Signed)
Noted  

## 2021-08-13 NOTE — Telephone Encounter (Signed)
Made patient aware of Dr. Woodward Ku recommendations. Patient stated that she would just move her dialysis to another day and she would come to her appointment on 4/21 at 10:40.   ?

## 2021-08-14 DIAGNOSIS — Z992 Dependence on renal dialysis: Secondary | ICD-10-CM | POA: Diagnosis not present

## 2021-08-14 DIAGNOSIS — N2581 Secondary hyperparathyroidism of renal origin: Secondary | ICD-10-CM | POA: Diagnosis not present

## 2021-08-14 DIAGNOSIS — N186 End stage renal disease: Secondary | ICD-10-CM | POA: Diagnosis not present

## 2021-08-16 ENCOUNTER — Encounter: Payer: Self-pay | Admitting: Gastroenterology

## 2021-08-16 ENCOUNTER — Ambulatory Visit: Payer: BC Managed Care – PPO | Admitting: Gastroenterology

## 2021-08-16 VITALS — BP 204/72 | HR 76 | Ht 63.0 in | Wt 174.2 lb

## 2021-08-16 DIAGNOSIS — K219 Gastro-esophageal reflux disease without esophagitis: Secondary | ICD-10-CM | POA: Diagnosis not present

## 2021-08-16 DIAGNOSIS — K224 Dyskinesia of esophagus: Secondary | ICD-10-CM | POA: Diagnosis not present

## 2021-08-16 DIAGNOSIS — R1310 Dysphagia, unspecified: Secondary | ICD-10-CM

## 2021-08-16 MED ORDER — HYOSCYAMINE SULFATE SL 0.125 MG SL SUBL: 1.0000 | SUBLINGUAL_TABLET | Freq: Every day | SUBLINGUAL | 3 refills | Status: AC | PRN

## 2021-08-16 MED ORDER — LANSOPRAZOLE 30 MG PO CPDR: 30.0000 mg | DELAYED_RELEASE_CAPSULE | Freq: Every day | ORAL | 3 refills | Status: AC

## 2021-08-16 NOTE — Progress Notes (Signed)
? ?       ? ?Alison Baker    [id number]    May 30, 1955 ? ?Primary Care Physician:Yates, Remigio Eisenmenger, PA ? ?Referring Physician: Renaldo Reel, PA ?[address] ?[address] ?[place],  [place] [address] ? ? ?Chief complaint:  Dysphagia, GERD ? ?HPI: ? ?66 year old very pleasant female with history of end-stage renal disease on dialysis here for follow-up for dysphagia and GERD ? ?She continues to have intermittent episodes of dysphagia, feels like spasm usually worse with heavy meals or when she drinks cold drinks.  She usually prefers not to drink anything cold as she feels it makes her stomach upset ? ?Barium esophagram June 24, 2021: ?1. Normal esophagram. ?2. Small hiatal hernia. ?13 mm barium tablet passed normally ?  ?She is taking daily omeprazole.  She has breakthrough heartburn intermittently and indigestion.  Dexilant was not covered by insurance ? ?Bowel habits have improved with daily Colace, she is having daily soft bowel movement.  Denies any constipation, diarrhea, rectal bleeding or mucus. ?  ?She is s/p cholecystectomy in 2018. ?  ?She is currently undergoing work-up for renal transplant ?  ?EGD 05/07/2018 ?- One benign-appearing, intrinsic moderate stenosis was found 38 cm from the incisors. This ?stenosis measured 1.5 cm (inner diameter). There was also associated esophagitis as ?manifested by erythema, mild friability, and edema. The scope was withdrawn. Dilation was ?performed with a Maloney dilator with no resistance at 62 Fr. ?  ?Colonoscopy 05/07/2018 ?- One 5 mm polyp in the cecum, removed with a cold snare. Resected and retrieved. ?- Diverticulosis in the sigmoid colon. ?- The examination was otherwise normal on direct and retroflexion views. ?  ?  ?CTA coronary June 06, 2021 ?1. 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  ?2. Coronary calcium score of 146. This was 88th percentile for age and sex matched control. ?3. Normal coronary origin with right dominance. ?4.  Dilated main pulmonary artery at 28 mm, suggestive of pulmonary hypertension. ?5. Aortic atherosclerosis and aortic valve annular calcification. ?6. Aggressive cardiovascular risk factor modification is recommended. ? ? ?Outpatient Encounter Medications as of 08/16/2021  ?Medication Sig  ? acetaminophen (TYLENOL) 500 MG tablet Take 500 mg by mouth 3 (three) times daily as needed for headache.  ? amLODipine (NORVASC) 10 MG tablet Take 10 mg by mouth See admin instructions. Takes on non dialysis days, Sunday, Tuesday, Thursday and Saturday takes at bedtime  ? calcium carbonate (TUMS EX) 750 MG chewable tablet Chew 1 tablet by mouth daily.  ? fluticasone (FLONASE) 50 MCG/ACT nasal spray Place into both nostrils as needed.  ? folic acid-vitamin b complex-vitamin c-selenium-zinc (DIALYVITE) 3 MG TABS tablet Take 1 tablet by mouth daily.  ? labetalol (NORMODYNE) 100 MG tablet Take 1 tablet by mouth 2 (two) times daily.  ? meclizine (ANTIVERT) 25 MG tablet Take 25 mg by mouth 3 (three) times daily as needed for dizziness.  ? omeprazole (PRILOSEC) 40 MG capsule Take 40 mg by mouth daily.  ? ?No facility-administered encounter medications on file as of 08/16/2021.  ? ? ?Allergies as of 08/16/2021 - Review Complete 08/13/2021  ?Allergen Reaction Noted  ? Nsaids Other (See Comments) 05/23/2015  ? Penicillins Rash and Other (See Comments) 08/29/2015  ? Strawberry extract Swelling 09/07/2015  ? Clonidine Nausea And Vomiting 05/23/2015  ? Sulfa antibiotics Rash and Other (See Comments) 07/02/2016  ? ? ?Past Medical History:  ?Diagnosis Date  ? Anemia   ? Anxiety   ?  Complication of anesthesia   ? slow to awaken, sentive to medications rarely even takes a Tylenol.  ? Diarrhea   ? End-stage kidney disease ([place])   ? M/W/F- [facility]  ? Gastric ulcer with hemorrhage   ? GERD (gastroesophageal reflux disease)   ? Hyperlipidemia   ? Hypertension   ? Pneumonia   ? PONV (postoperative nausea and vomiting)   ? after hysterectomy- 1980's  ?  Pre-diabetes   ? Umbilical hernia   ? ? ?Past Surgical History:  ?Procedure Laterality Date  ? APPENDECTOMY    ? BASCILIC VEIN TRANSPOSITION Left 07/15/2016  ? Procedure: BRACHIAL VEIN TRANSPOSITION FIRST STAGE;  Surgeon: Conrad Le Roy, MD;  Location: [facility];  Service: Vascular;  Laterality: Left;  ? BASCILIC VEIN TRANSPOSITION Left 11/04/2016  ? Procedure: SECOND STAGE BRACHIAL VEIN TRANSPOSITION;  Surgeon: Conrad Opp, MD;  Location: [facility];  Service: Vascular;  Laterality: Left;  ? CHOLECYSTECTOMY    ? partial hepatectomy  ? COLONOSCOPY W/ POLYPECTOMY    ? EYE SURGERY Right   ? catarct  ? EYE SURGERY Left 08/2016  ? cataract  ? INSERTION OF DIALYSIS CATHETER    ? IR GENERIC HISTORICAL  06/23/2016  ? IR US GUIDE VASC ACCESS RIGHT 06/23/2016 Arne Cleveland, MD [facility]  ? IR GENERIC HISTORICAL  06/23/2016  ? IR FLUORO GUIDE CV LINE RIGHT 06/23/2016 Arne Cleveland, MD [facility]  ? TOTAL ABDOMINAL HYSTERECTOMY W/ BILATERAL SALPINGOOPHORECTOMY    ? TRABECULECTOMY Bilateral   ? ? ?Family History  ?Problem Relation Age of Onset  ? Hypertension Mother   ? Cancer Sister   ?     vulvular cancer -then mets to brain, lung  ? Colon cancer Neg Hx   ? Esophageal cancer Neg Hx   ? Stomach cancer Neg Hx   ? Pancreatic cancer Neg Hx   ? Liver disease Neg Hx   ? ? ?Social History  ? ?Socioeconomic History  ? Marital status: Married  ?  Spouse name: Not on file  ? Number of children: Not on file  ? Years of education: Not on file  ? Highest education level: Not on file  ?Occupational History  ? Not on file  ?Tobacco Use  ? Smoking status: Former  ?  Types: Cigarettes  ?  Quit date: 06/27/1995  ?  Years since quitting: 26.1  ? Smokeless tobacco: Never  ?Vaping Use  ? Vaping Use: Never used  ?Substance and Sexual Activity  ? Alcohol use: No  ? Drug use: No  ? Sexual activity: Not on file  ?Other Topics Concern  ? Not on file  ?Social History Narrative  ? Not on file  ? ?Social Determinants of Health  ? ?Financial Resource Strain: Not  on file  ?Food Insecurity: Not on file  ?Transportation Needs: Not on file  ?Physical Activity: Not on file  ?Stress: Not on file  ?Social Connections: Not on file  ?Intimate Partner Violence: Not on file  ? ? ? ? ?Review of systems: ?All other review of systems negative except as mentioned in the HPI. ? ? ?Physical Exam: ?Vitals:  ? 08/16/21 1045  ?BP: (!) 204/72  ?Pulse: 76  ? ?Body mass index is 30.86 kg/m?. ?Gen:      No acute distress ?HEENT:  sclera anicteric ?Abd:      soft, non-tender; no palpable masses, no distension ?Ext:    No edema ?Neuro: alert and oriented x 3 ?Psych: normal mood and affect ? ?Data  Reviewed: ? ?Reviewed labs, radiology imaging, old records and pertinent past GI work up ? ? ?Assessment and Plan/Recommendations: ? ?66 year old very pleasant female with history of end-stage renal disease on dialysis, hypertension, chronic GERD with complaints of esophageal spasm and intermittent dysphagia. ?  ?Barium esophagram negative for significant motility disorder, peptic stricture, erosive esophagitis or mass lesion.  13 mm barium tablet passed through normally ? ?Esophageal spasms: Advised patient to avoid drinking any cold beverages along with meals ?Drink warm soup, warm water or peppermint tea prior to meals.  Can also use peppermint oil diluted in warm water if needed up to 3 times daily.  Patient was provided samples for IBgard to use up to 3 times daily as needed ?Use Levsin sublingual tablet 0.125 mg as needed for severe spasms ?  ?Uncontrolled GERD and nausea ?Dexilant was covered by insurance and had high out-of-pocket expense to the patient. ?She is having breakthrough symptoms on omeprazole.  We will switch to lansoprazole 30 mg daily, 30 minutes before breakfast ?Antireflux measures ?  ?IBS with constipation and diarrhea: Improved ?Continue Colace 1 capsule at bedtime as needed, advised patient to skip it the night before dialysis to prevent episodes of diarrhea and it is okay to use  Imodium as needed but advised patient to avoid using it regularly ?  ?Large abdominal wall/ventral hernia: Continue abdominal wall binder during activity and follow-up with surgery ?  ?Return in 6 mont

## 2021-08-16 NOTE — Patient Instructions (Addendum)
We have sent the following medications to your pharmacy for you to pick up at your convenience:  Lansoprazole 30 minutes before breakfast and Levsin ? ?STOP Omeprazole ? ?Drink warm water,soup or peppermint tea before meals ? ?Use IBgard three times as needed, Over the counter ? ?If you are age 64 or older, your body mass index should be between 23-30. Your Body mass index is 30.86 kg/m?Marland Kitchen If this is out of the aforementioned range listed, please consider follow up with your Primary Care Provider. ? ?If you are age 61 or younger, your body mass index should be between 19-25. Your Body mass index is 30.86 kg/m?Marland Kitchen If this is out of the aformentioned range listed, please consider follow up with your Primary Care Provider.  ? ?________________________________________________________ ? ?The [facility] providers would like to encourage you to use [facility] to communicate with providers for non-urgent requests or questions.  Due to long hold times on the telephone, sending your provider a message by [facility] may be a faster and more efficient way to get a response.  Please allow 48 business hours for a response.  Please remember that this is for non-urgent requests.  ?_______________________________________________________  ? ?I appreciate the  opportunity to care for you ? ?Thank You  ? ?Harl Bowie , MD  ? ? ?

## 2021-08-17 DIAGNOSIS — N2581 Secondary hyperparathyroidism of renal origin: Secondary | ICD-10-CM | POA: Diagnosis not present

## 2021-08-17 DIAGNOSIS — Z992 Dependence on renal dialysis: Secondary | ICD-10-CM | POA: Diagnosis not present

## 2021-08-17 DIAGNOSIS — N186 End stage renal disease: Secondary | ICD-10-CM | POA: Diagnosis not present

## 2021-08-19 DIAGNOSIS — Z992 Dependence on renal dialysis: Secondary | ICD-10-CM | POA: Diagnosis not present

## 2021-08-19 DIAGNOSIS — N2581 Secondary hyperparathyroidism of renal origin: Secondary | ICD-10-CM | POA: Diagnosis not present

## 2021-08-19 DIAGNOSIS — N186 End stage renal disease: Secondary | ICD-10-CM | POA: Diagnosis not present

## 2021-08-19 DIAGNOSIS — R197 Diarrhea, unspecified: Secondary | ICD-10-CM | POA: Diagnosis not present

## 2021-08-21 DIAGNOSIS — N186 End stage renal disease: Secondary | ICD-10-CM | POA: Diagnosis not present

## 2021-08-21 DIAGNOSIS — R197 Diarrhea, unspecified: Secondary | ICD-10-CM | POA: Diagnosis not present

## 2021-08-21 DIAGNOSIS — N2581 Secondary hyperparathyroidism of renal origin: Secondary | ICD-10-CM | POA: Diagnosis not present

## 2021-08-21 DIAGNOSIS — Z992 Dependence on renal dialysis: Secondary | ICD-10-CM | POA: Diagnosis not present

## 2021-08-23 ENCOUNTER — Encounter: Payer: Self-pay | Admitting: *Deleted

## 2021-08-23 DIAGNOSIS — N2581 Secondary hyperparathyroidism of renal origin: Secondary | ICD-10-CM | POA: Diagnosis not present

## 2021-08-23 DIAGNOSIS — K224 Dyskinesia of esophagus: Secondary | ICD-10-CM | POA: Insufficient documentation

## 2021-08-23 DIAGNOSIS — R197 Diarrhea, unspecified: Secondary | ICD-10-CM | POA: Diagnosis not present

## 2021-08-23 DIAGNOSIS — N186 End stage renal disease: Secondary | ICD-10-CM | POA: Diagnosis not present

## 2021-08-23 DIAGNOSIS — Z992 Dependence on renal dialysis: Secondary | ICD-10-CM | POA: Diagnosis not present

## 2021-08-25 DIAGNOSIS — Z992 Dependence on renal dialysis: Secondary | ICD-10-CM | POA: Diagnosis not present

## 2021-08-25 DIAGNOSIS — N186 End stage renal disease: Secondary | ICD-10-CM | POA: Diagnosis not present

## 2021-08-25 DIAGNOSIS — I129 Hypertensive chronic kidney disease with stage 1 through stage 4 chronic kidney disease, or unspecified chronic kidney disease: Secondary | ICD-10-CM | POA: Diagnosis not present

## 2021-08-26 DIAGNOSIS — N2581 Secondary hyperparathyroidism of renal origin: Secondary | ICD-10-CM | POA: Diagnosis not present

## 2021-08-26 DIAGNOSIS — Z992 Dependence on renal dialysis: Secondary | ICD-10-CM | POA: Diagnosis not present

## 2021-08-26 DIAGNOSIS — N186 End stage renal disease: Secondary | ICD-10-CM | POA: Diagnosis not present

## 2021-08-28 DIAGNOSIS — N186 End stage renal disease: Secondary | ICD-10-CM | POA: Diagnosis not present

## 2021-08-28 DIAGNOSIS — Z992 Dependence on renal dialysis: Secondary | ICD-10-CM | POA: Diagnosis not present

## 2021-08-28 DIAGNOSIS — N2581 Secondary hyperparathyroidism of renal origin: Secondary | ICD-10-CM | POA: Diagnosis not present

## 2021-08-30 DIAGNOSIS — N186 End stage renal disease: Secondary | ICD-10-CM | POA: Diagnosis not present

## 2021-08-30 DIAGNOSIS — N2581 Secondary hyperparathyroidism of renal origin: Secondary | ICD-10-CM | POA: Diagnosis not present

## 2021-08-30 DIAGNOSIS — Z992 Dependence on renal dialysis: Secondary | ICD-10-CM | POA: Diagnosis not present

## 2021-09-02 DIAGNOSIS — N2581 Secondary hyperparathyroidism of renal origin: Secondary | ICD-10-CM | POA: Diagnosis not present

## 2021-09-02 DIAGNOSIS — Z992 Dependence on renal dialysis: Secondary | ICD-10-CM | POA: Diagnosis not present

## 2021-09-02 DIAGNOSIS — N186 End stage renal disease: Secondary | ICD-10-CM | POA: Diagnosis not present

## 2021-09-04 DIAGNOSIS — N186 End stage renal disease: Secondary | ICD-10-CM | POA: Diagnosis not present

## 2021-09-04 DIAGNOSIS — N2581 Secondary hyperparathyroidism of renal origin: Secondary | ICD-10-CM | POA: Diagnosis not present

## 2021-09-04 DIAGNOSIS — Z992 Dependence on renal dialysis: Secondary | ICD-10-CM | POA: Diagnosis not present

## 2021-09-06 DIAGNOSIS — Z992 Dependence on renal dialysis: Secondary | ICD-10-CM | POA: Diagnosis not present

## 2021-09-06 DIAGNOSIS — N2581 Secondary hyperparathyroidism of renal origin: Secondary | ICD-10-CM | POA: Diagnosis not present

## 2021-09-06 DIAGNOSIS — N186 End stage renal disease: Secondary | ICD-10-CM | POA: Diagnosis not present

## 2021-09-09 DIAGNOSIS — N186 End stage renal disease: Secondary | ICD-10-CM | POA: Diagnosis not present

## 2021-09-09 DIAGNOSIS — Z992 Dependence on renal dialysis: Secondary | ICD-10-CM | POA: Diagnosis not present

## 2021-09-09 DIAGNOSIS — N2581 Secondary hyperparathyroidism of renal origin: Secondary | ICD-10-CM | POA: Diagnosis not present

## 2021-09-11 DIAGNOSIS — N186 End stage renal disease: Secondary | ICD-10-CM | POA: Diagnosis not present

## 2021-09-11 DIAGNOSIS — Z992 Dependence on renal dialysis: Secondary | ICD-10-CM | POA: Diagnosis not present

## 2021-09-11 DIAGNOSIS — N2581 Secondary hyperparathyroidism of renal origin: Secondary | ICD-10-CM | POA: Diagnosis not present

## 2021-09-13 DIAGNOSIS — N186 End stage renal disease: Secondary | ICD-10-CM | POA: Diagnosis not present

## 2021-09-13 DIAGNOSIS — N2581 Secondary hyperparathyroidism of renal origin: Secondary | ICD-10-CM | POA: Diagnosis not present

## 2021-09-13 DIAGNOSIS — Z992 Dependence on renal dialysis: Secondary | ICD-10-CM | POA: Diagnosis not present

## 2021-09-16 DIAGNOSIS — Z992 Dependence on renal dialysis: Secondary | ICD-10-CM | POA: Diagnosis not present

## 2021-09-16 DIAGNOSIS — N2581 Secondary hyperparathyroidism of renal origin: Secondary | ICD-10-CM | POA: Diagnosis not present

## 2021-09-16 DIAGNOSIS — N186 End stage renal disease: Secondary | ICD-10-CM | POA: Diagnosis not present

## 2021-09-18 DIAGNOSIS — N2581 Secondary hyperparathyroidism of renal origin: Secondary | ICD-10-CM | POA: Diagnosis not present

## 2021-09-18 DIAGNOSIS — N186 End stage renal disease: Secondary | ICD-10-CM | POA: Diagnosis not present

## 2021-09-18 DIAGNOSIS — Z992 Dependence on renal dialysis: Secondary | ICD-10-CM | POA: Diagnosis not present

## 2021-09-20 DIAGNOSIS — N2581 Secondary hyperparathyroidism of renal origin: Secondary | ICD-10-CM | POA: Diagnosis not present

## 2021-09-20 DIAGNOSIS — N186 End stage renal disease: Secondary | ICD-10-CM | POA: Diagnosis not present

## 2021-09-20 DIAGNOSIS — Z992 Dependence on renal dialysis: Secondary | ICD-10-CM | POA: Diagnosis not present

## 2021-09-23 DIAGNOSIS — N2581 Secondary hyperparathyroidism of renal origin: Secondary | ICD-10-CM | POA: Diagnosis not present

## 2021-09-23 DIAGNOSIS — Z992 Dependence on renal dialysis: Secondary | ICD-10-CM | POA: Diagnosis not present

## 2021-09-23 DIAGNOSIS — N186 End stage renal disease: Secondary | ICD-10-CM | POA: Diagnosis not present

## 2021-09-25 DIAGNOSIS — I129 Hypertensive chronic kidney disease with stage 1 through stage 4 chronic kidney disease, or unspecified chronic kidney disease: Secondary | ICD-10-CM | POA: Diagnosis not present

## 2021-09-25 DIAGNOSIS — N2581 Secondary hyperparathyroidism of renal origin: Secondary | ICD-10-CM | POA: Diagnosis not present

## 2021-09-25 DIAGNOSIS — Z992 Dependence on renal dialysis: Secondary | ICD-10-CM | POA: Diagnosis not present

## 2021-09-25 DIAGNOSIS — N186 End stage renal disease: Secondary | ICD-10-CM | POA: Diagnosis not present

## 2021-09-27 DIAGNOSIS — Z992 Dependence on renal dialysis: Secondary | ICD-10-CM | POA: Diagnosis not present

## 2021-09-27 DIAGNOSIS — N2581 Secondary hyperparathyroidism of renal origin: Secondary | ICD-10-CM | POA: Diagnosis not present

## 2021-09-27 DIAGNOSIS — N186 End stage renal disease: Secondary | ICD-10-CM | POA: Diagnosis not present

## 2021-09-30 DIAGNOSIS — Z992 Dependence on renal dialysis: Secondary | ICD-10-CM | POA: Diagnosis not present

## 2021-09-30 DIAGNOSIS — N2581 Secondary hyperparathyroidism of renal origin: Secondary | ICD-10-CM | POA: Diagnosis not present

## 2021-09-30 DIAGNOSIS — N186 End stage renal disease: Secondary | ICD-10-CM | POA: Diagnosis not present

## 2021-10-02 DIAGNOSIS — Z992 Dependence on renal dialysis: Secondary | ICD-10-CM | POA: Diagnosis not present

## 2021-10-02 DIAGNOSIS — N186 End stage renal disease: Secondary | ICD-10-CM | POA: Diagnosis not present

## 2021-10-02 DIAGNOSIS — N2581 Secondary hyperparathyroidism of renal origin: Secondary | ICD-10-CM | POA: Diagnosis not present

## 2021-10-04 DIAGNOSIS — N186 End stage renal disease: Secondary | ICD-10-CM | POA: Diagnosis not present

## 2021-10-04 DIAGNOSIS — N2581 Secondary hyperparathyroidism of renal origin: Secondary | ICD-10-CM | POA: Diagnosis not present

## 2021-10-04 DIAGNOSIS — Z992 Dependence on renal dialysis: Secondary | ICD-10-CM | POA: Diagnosis not present

## 2021-10-05 DIAGNOSIS — N186 End stage renal disease: Secondary | ICD-10-CM | POA: Diagnosis not present

## 2021-10-05 DIAGNOSIS — N2581 Secondary hyperparathyroidism of renal origin: Secondary | ICD-10-CM | POA: Diagnosis not present

## 2021-10-05 DIAGNOSIS — Z992 Dependence on renal dialysis: Secondary | ICD-10-CM | POA: Diagnosis not present

## 2021-10-07 DIAGNOSIS — N186 End stage renal disease: Secondary | ICD-10-CM | POA: Diagnosis not present

## 2021-10-07 DIAGNOSIS — N2581 Secondary hyperparathyroidism of renal origin: Secondary | ICD-10-CM | POA: Diagnosis not present

## 2021-10-07 DIAGNOSIS — Z992 Dependence on renal dialysis: Secondary | ICD-10-CM | POA: Diagnosis not present

## 2021-10-08 DIAGNOSIS — Z1231 Encounter for screening mammogram for malignant neoplasm of breast: Secondary | ICD-10-CM | POA: Diagnosis not present

## 2021-10-09 DIAGNOSIS — Z992 Dependence on renal dialysis: Secondary | ICD-10-CM | POA: Diagnosis not present

## 2021-10-09 DIAGNOSIS — N2581 Secondary hyperparathyroidism of renal origin: Secondary | ICD-10-CM | POA: Diagnosis not present

## 2021-10-09 DIAGNOSIS — N186 End stage renal disease: Secondary | ICD-10-CM | POA: Diagnosis not present

## 2021-10-11 DIAGNOSIS — Z992 Dependence on renal dialysis: Secondary | ICD-10-CM | POA: Diagnosis not present

## 2021-10-11 DIAGNOSIS — N2581 Secondary hyperparathyroidism of renal origin: Secondary | ICD-10-CM | POA: Diagnosis not present

## 2021-10-11 DIAGNOSIS — N186 End stage renal disease: Secondary | ICD-10-CM | POA: Diagnosis not present

## 2021-10-14 DIAGNOSIS — Z992 Dependence on renal dialysis: Secondary | ICD-10-CM | POA: Diagnosis not present

## 2021-10-14 DIAGNOSIS — N186 End stage renal disease: Secondary | ICD-10-CM | POA: Diagnosis not present

## 2021-10-14 DIAGNOSIS — N2581 Secondary hyperparathyroidism of renal origin: Secondary | ICD-10-CM | POA: Diagnosis not present

## 2021-10-16 DIAGNOSIS — Z992 Dependence on renal dialysis: Secondary | ICD-10-CM | POA: Diagnosis not present

## 2021-10-16 DIAGNOSIS — N186 End stage renal disease: Secondary | ICD-10-CM | POA: Diagnosis not present

## 2021-10-16 DIAGNOSIS — N2581 Secondary hyperparathyroidism of renal origin: Secondary | ICD-10-CM | POA: Diagnosis not present

## 2021-10-18 DIAGNOSIS — N2581 Secondary hyperparathyroidism of renal origin: Secondary | ICD-10-CM | POA: Diagnosis not present

## 2021-10-18 DIAGNOSIS — Z992 Dependence on renal dialysis: Secondary | ICD-10-CM | POA: Diagnosis not present

## 2021-10-18 DIAGNOSIS — N186 End stage renal disease: Secondary | ICD-10-CM | POA: Diagnosis not present

## 2021-10-21 DIAGNOSIS — N186 End stage renal disease: Secondary | ICD-10-CM | POA: Diagnosis not present

## 2021-10-21 DIAGNOSIS — N2581 Secondary hyperparathyroidism of renal origin: Secondary | ICD-10-CM | POA: Diagnosis not present

## 2021-10-21 DIAGNOSIS — Z992 Dependence on renal dialysis: Secondary | ICD-10-CM | POA: Diagnosis not present

## 2021-10-23 DIAGNOSIS — N2581 Secondary hyperparathyroidism of renal origin: Secondary | ICD-10-CM | POA: Diagnosis not present

## 2021-10-23 DIAGNOSIS — N186 End stage renal disease: Secondary | ICD-10-CM | POA: Diagnosis not present

## 2021-10-23 DIAGNOSIS — Z992 Dependence on renal dialysis: Secondary | ICD-10-CM | POA: Diagnosis not present

## 2021-10-25 DIAGNOSIS — N186 End stage renal disease: Secondary | ICD-10-CM | POA: Diagnosis not present

## 2021-10-25 DIAGNOSIS — Z992 Dependence on renal dialysis: Secondary | ICD-10-CM | POA: Diagnosis not present

## 2021-10-25 DIAGNOSIS — N2581 Secondary hyperparathyroidism of renal origin: Secondary | ICD-10-CM | POA: Diagnosis not present

## 2021-10-25 DIAGNOSIS — I129 Hypertensive chronic kidney disease with stage 1 through stage 4 chronic kidney disease, or unspecified chronic kidney disease: Secondary | ICD-10-CM | POA: Diagnosis not present

## 2021-10-28 DIAGNOSIS — Z992 Dependence on renal dialysis: Secondary | ICD-10-CM | POA: Diagnosis not present

## 2021-10-28 DIAGNOSIS — N186 End stage renal disease: Secondary | ICD-10-CM | POA: Diagnosis not present

## 2021-10-28 DIAGNOSIS — N2581 Secondary hyperparathyroidism of renal origin: Secondary | ICD-10-CM | POA: Diagnosis not present

## 2021-10-30 DIAGNOSIS — N186 End stage renal disease: Secondary | ICD-10-CM | POA: Diagnosis not present

## 2021-10-30 DIAGNOSIS — Z992 Dependence on renal dialysis: Secondary | ICD-10-CM | POA: Diagnosis not present

## 2021-10-30 DIAGNOSIS — N2581 Secondary hyperparathyroidism of renal origin: Secondary | ICD-10-CM | POA: Diagnosis not present

## 2021-10-31 DIAGNOSIS — K219 Gastro-esophageal reflux disease without esophagitis: Secondary | ICD-10-CM | POA: Diagnosis not present

## 2021-10-31 DIAGNOSIS — E785 Hyperlipidemia, unspecified: Secondary | ICD-10-CM | POA: Diagnosis not present

## 2021-10-31 DIAGNOSIS — I10 Essential (primary) hypertension: Secondary | ICD-10-CM | POA: Diagnosis not present

## 2021-10-31 DIAGNOSIS — Z992 Dependence on renal dialysis: Secondary | ICD-10-CM | POA: Diagnosis not present

## 2021-11-01 DIAGNOSIS — N186 End stage renal disease: Secondary | ICD-10-CM | POA: Diagnosis not present

## 2021-11-01 DIAGNOSIS — N2581 Secondary hyperparathyroidism of renal origin: Secondary | ICD-10-CM | POA: Diagnosis not present

## 2021-11-01 DIAGNOSIS — Z992 Dependence on renal dialysis: Secondary | ICD-10-CM | POA: Diagnosis not present

## 2021-11-04 DIAGNOSIS — N2581 Secondary hyperparathyroidism of renal origin: Secondary | ICD-10-CM | POA: Diagnosis not present

## 2021-11-04 DIAGNOSIS — Z992 Dependence on renal dialysis: Secondary | ICD-10-CM | POA: Diagnosis not present

## 2021-11-04 DIAGNOSIS — N186 End stage renal disease: Secondary | ICD-10-CM | POA: Diagnosis not present

## 2021-11-05 DIAGNOSIS — K219 Gastro-esophageal reflux disease without esophagitis: Secondary | ICD-10-CM | POA: Diagnosis not present

## 2021-11-05 DIAGNOSIS — Z7682 Awaiting organ transplant status: Secondary | ICD-10-CM | POA: Diagnosis not present

## 2021-11-05 DIAGNOSIS — I132 Hypertensive heart and chronic kidney disease with heart failure and with stage 5 chronic kidney disease, or end stage renal disease: Secondary | ICD-10-CM | POA: Diagnosis not present

## 2021-11-05 DIAGNOSIS — K429 Umbilical hernia without obstruction or gangrene: Secondary | ICD-10-CM | POA: Diagnosis not present

## 2021-11-05 DIAGNOSIS — K224 Dyskinesia of esophagus: Secondary | ICD-10-CM | POA: Diagnosis not present

## 2021-11-05 DIAGNOSIS — I708 Atherosclerosis of other arteries: Secondary | ICD-10-CM | POA: Diagnosis not present

## 2021-11-05 DIAGNOSIS — R935 Abnormal findings on diagnostic imaging of other abdominal regions, including retroperitoneum: Secondary | ICD-10-CM | POA: Diagnosis not present

## 2021-11-05 DIAGNOSIS — Z8711 Personal history of peptic ulcer disease: Secondary | ICD-10-CM | POA: Diagnosis not present

## 2021-11-05 DIAGNOSIS — R1319 Other dysphagia: Secondary | ICD-10-CM | POA: Diagnosis not present

## 2021-11-05 DIAGNOSIS — Z992 Dependence on renal dialysis: Secondary | ICD-10-CM | POA: Diagnosis not present

## 2021-11-05 DIAGNOSIS — I700 Atherosclerosis of aorta: Secondary | ICD-10-CM | POA: Diagnosis not present

## 2021-11-05 DIAGNOSIS — N186 End stage renal disease: Secondary | ICD-10-CM | POA: Diagnosis not present

## 2021-11-05 DIAGNOSIS — Z87891 Personal history of nicotine dependence: Secondary | ICD-10-CM | POA: Diagnosis not present

## 2021-11-05 DIAGNOSIS — I10 Essential (primary) hypertension: Secondary | ICD-10-CM | POA: Diagnosis not present

## 2021-11-05 DIAGNOSIS — N3289 Other specified disorders of bladder: Secondary | ICD-10-CM | POA: Diagnosis not present

## 2021-11-05 DIAGNOSIS — I509 Heart failure, unspecified: Secondary | ICD-10-CM | POA: Diagnosis not present

## 2021-11-05 DIAGNOSIS — E1122 Type 2 diabetes mellitus with diabetic chronic kidney disease: Secondary | ICD-10-CM | POA: Diagnosis not present

## 2021-11-05 DIAGNOSIS — H538 Other visual disturbances: Secondary | ICD-10-CM | POA: Diagnosis not present

## 2021-11-05 DIAGNOSIS — M438X6 Other specified deforming dorsopathies, lumbar region: Secondary | ICD-10-CM | POA: Diagnosis not present

## 2021-11-05 DIAGNOSIS — Z6829 Body mass index (BMI) 29.0-29.9, adult: Secondary | ICD-10-CM | POA: Diagnosis not present

## 2021-11-05 DIAGNOSIS — N261 Atrophy of kidney (terminal): Secondary | ICD-10-CM | POA: Diagnosis not present

## 2021-11-06 DIAGNOSIS — N186 End stage renal disease: Secondary | ICD-10-CM | POA: Diagnosis not present

## 2021-11-06 DIAGNOSIS — N2581 Secondary hyperparathyroidism of renal origin: Secondary | ICD-10-CM | POA: Diagnosis not present

## 2021-11-06 DIAGNOSIS — Z992 Dependence on renal dialysis: Secondary | ICD-10-CM | POA: Diagnosis not present

## 2021-11-08 DIAGNOSIS — Z992 Dependence on renal dialysis: Secondary | ICD-10-CM | POA: Diagnosis not present

## 2021-11-08 DIAGNOSIS — N2581 Secondary hyperparathyroidism of renal origin: Secondary | ICD-10-CM | POA: Diagnosis not present

## 2021-11-08 DIAGNOSIS — N186 End stage renal disease: Secondary | ICD-10-CM | POA: Diagnosis not present

## 2021-11-11 DIAGNOSIS — N186 End stage renal disease: Secondary | ICD-10-CM | POA: Diagnosis not present

## 2021-11-11 DIAGNOSIS — N2581 Secondary hyperparathyroidism of renal origin: Secondary | ICD-10-CM | POA: Diagnosis not present

## 2021-11-11 DIAGNOSIS — Z992 Dependence on renal dialysis: Secondary | ICD-10-CM | POA: Diagnosis not present

## 2021-11-13 DIAGNOSIS — Z992 Dependence on renal dialysis: Secondary | ICD-10-CM | POA: Diagnosis not present

## 2021-11-13 DIAGNOSIS — N2581 Secondary hyperparathyroidism of renal origin: Secondary | ICD-10-CM | POA: Diagnosis not present

## 2021-11-13 DIAGNOSIS — N186 End stage renal disease: Secondary | ICD-10-CM | POA: Diagnosis not present

## 2021-11-15 DIAGNOSIS — N186 End stage renal disease: Secondary | ICD-10-CM | POA: Diagnosis not present

## 2021-11-15 DIAGNOSIS — N2581 Secondary hyperparathyroidism of renal origin: Secondary | ICD-10-CM | POA: Diagnosis not present

## 2021-11-15 DIAGNOSIS — Z992 Dependence on renal dialysis: Secondary | ICD-10-CM | POA: Diagnosis not present

## 2021-11-18 DIAGNOSIS — N2581 Secondary hyperparathyroidism of renal origin: Secondary | ICD-10-CM | POA: Diagnosis not present

## 2021-11-18 DIAGNOSIS — Z992 Dependence on renal dialysis: Secondary | ICD-10-CM | POA: Diagnosis not present

## 2021-11-18 DIAGNOSIS — N186 End stage renal disease: Secondary | ICD-10-CM | POA: Diagnosis not present

## 2021-11-20 DIAGNOSIS — N186 End stage renal disease: Secondary | ICD-10-CM | POA: Diagnosis not present

## 2021-11-20 DIAGNOSIS — Z992 Dependence on renal dialysis: Secondary | ICD-10-CM | POA: Diagnosis not present

## 2021-11-20 DIAGNOSIS — N2581 Secondary hyperparathyroidism of renal origin: Secondary | ICD-10-CM | POA: Diagnosis not present

## 2021-11-22 DIAGNOSIS — N2581 Secondary hyperparathyroidism of renal origin: Secondary | ICD-10-CM | POA: Diagnosis not present

## 2021-11-22 DIAGNOSIS — N186 End stage renal disease: Secondary | ICD-10-CM | POA: Diagnosis not present

## 2021-11-22 DIAGNOSIS — Z992 Dependence on renal dialysis: Secondary | ICD-10-CM | POA: Diagnosis not present

## 2021-11-25 DIAGNOSIS — Z992 Dependence on renal dialysis: Secondary | ICD-10-CM | POA: Diagnosis not present

## 2021-11-25 DIAGNOSIS — I129 Hypertensive chronic kidney disease with stage 1 through stage 4 chronic kidney disease, or unspecified chronic kidney disease: Secondary | ICD-10-CM | POA: Diagnosis not present

## 2021-11-25 DIAGNOSIS — N186 End stage renal disease: Secondary | ICD-10-CM | POA: Diagnosis not present

## 2021-11-25 DIAGNOSIS — N2581 Secondary hyperparathyroidism of renal origin: Secondary | ICD-10-CM | POA: Diagnosis not present

## 2021-11-27 DIAGNOSIS — N2581 Secondary hyperparathyroidism of renal origin: Secondary | ICD-10-CM | POA: Diagnosis not present

## 2021-11-27 DIAGNOSIS — N186 End stage renal disease: Secondary | ICD-10-CM | POA: Diagnosis not present

## 2021-11-27 DIAGNOSIS — Z992 Dependence on renal dialysis: Secondary | ICD-10-CM | POA: Diagnosis not present

## 2021-11-29 DIAGNOSIS — N2581 Secondary hyperparathyroidism of renal origin: Secondary | ICD-10-CM | POA: Diagnosis not present

## 2021-11-29 DIAGNOSIS — N186 End stage renal disease: Secondary | ICD-10-CM | POA: Diagnosis not present

## 2021-11-29 DIAGNOSIS — Z992 Dependence on renal dialysis: Secondary | ICD-10-CM | POA: Diagnosis not present

## 2021-12-02 DIAGNOSIS — N2581 Secondary hyperparathyroidism of renal origin: Secondary | ICD-10-CM | POA: Diagnosis not present

## 2021-12-02 DIAGNOSIS — Z992 Dependence on renal dialysis: Secondary | ICD-10-CM | POA: Diagnosis not present

## 2021-12-02 DIAGNOSIS — N186 End stage renal disease: Secondary | ICD-10-CM | POA: Diagnosis not present

## 2021-12-02 DIAGNOSIS — R197 Diarrhea, unspecified: Secondary | ICD-10-CM | POA: Diagnosis not present

## 2021-12-04 DIAGNOSIS — N2581 Secondary hyperparathyroidism of renal origin: Secondary | ICD-10-CM | POA: Diagnosis not present

## 2021-12-04 DIAGNOSIS — R197 Diarrhea, unspecified: Secondary | ICD-10-CM | POA: Diagnosis not present

## 2021-12-04 DIAGNOSIS — N186 End stage renal disease: Secondary | ICD-10-CM | POA: Diagnosis not present

## 2021-12-04 DIAGNOSIS — Z992 Dependence on renal dialysis: Secondary | ICD-10-CM | POA: Diagnosis not present

## 2021-12-06 DIAGNOSIS — Z992 Dependence on renal dialysis: Secondary | ICD-10-CM | POA: Diagnosis not present

## 2021-12-06 DIAGNOSIS — R197 Diarrhea, unspecified: Secondary | ICD-10-CM | POA: Diagnosis not present

## 2021-12-06 DIAGNOSIS — N186 End stage renal disease: Secondary | ICD-10-CM | POA: Diagnosis not present

## 2021-12-06 DIAGNOSIS — N2581 Secondary hyperparathyroidism of renal origin: Secondary | ICD-10-CM | POA: Diagnosis not present

## 2021-12-09 DIAGNOSIS — Z992 Dependence on renal dialysis: Secondary | ICD-10-CM | POA: Diagnosis not present

## 2021-12-09 DIAGNOSIS — N2581 Secondary hyperparathyroidism of renal origin: Secondary | ICD-10-CM | POA: Diagnosis not present

## 2021-12-09 DIAGNOSIS — N186 End stage renal disease: Secondary | ICD-10-CM | POA: Diagnosis not present

## 2021-12-11 DIAGNOSIS — N2581 Secondary hyperparathyroidism of renal origin: Secondary | ICD-10-CM | POA: Diagnosis not present

## 2021-12-11 DIAGNOSIS — N186 End stage renal disease: Secondary | ICD-10-CM | POA: Diagnosis not present

## 2021-12-11 DIAGNOSIS — Z992 Dependence on renal dialysis: Secondary | ICD-10-CM | POA: Diagnosis not present

## 2021-12-13 DIAGNOSIS — N186 End stage renal disease: Secondary | ICD-10-CM | POA: Diagnosis not present

## 2021-12-13 DIAGNOSIS — N2581 Secondary hyperparathyroidism of renal origin: Secondary | ICD-10-CM | POA: Diagnosis not present

## 2021-12-13 DIAGNOSIS — Z992 Dependence on renal dialysis: Secondary | ICD-10-CM | POA: Diagnosis not present

## 2021-12-16 DIAGNOSIS — N186 End stage renal disease: Secondary | ICD-10-CM | POA: Diagnosis not present

## 2021-12-16 DIAGNOSIS — Z992 Dependence on renal dialysis: Secondary | ICD-10-CM | POA: Diagnosis not present

## 2021-12-16 DIAGNOSIS — N2581 Secondary hyperparathyroidism of renal origin: Secondary | ICD-10-CM | POA: Diagnosis not present

## 2021-12-18 DIAGNOSIS — Z992 Dependence on renal dialysis: Secondary | ICD-10-CM | POA: Diagnosis not present

## 2021-12-18 DIAGNOSIS — N2581 Secondary hyperparathyroidism of renal origin: Secondary | ICD-10-CM | POA: Diagnosis not present

## 2021-12-18 DIAGNOSIS — N186 End stage renal disease: Secondary | ICD-10-CM | POA: Diagnosis not present

## 2021-12-20 DIAGNOSIS — Z992 Dependence on renal dialysis: Secondary | ICD-10-CM | POA: Diagnosis not present

## 2021-12-20 DIAGNOSIS — N186 End stage renal disease: Secondary | ICD-10-CM | POA: Diagnosis not present

## 2021-12-20 DIAGNOSIS — N2581 Secondary hyperparathyroidism of renal origin: Secondary | ICD-10-CM | POA: Diagnosis not present

## 2021-12-23 DIAGNOSIS — Z992 Dependence on renal dialysis: Secondary | ICD-10-CM | POA: Diagnosis not present

## 2021-12-23 DIAGNOSIS — N2581 Secondary hyperparathyroidism of renal origin: Secondary | ICD-10-CM | POA: Diagnosis not present

## 2021-12-23 DIAGNOSIS — N186 End stage renal disease: Secondary | ICD-10-CM | POA: Diagnosis not present

## 2021-12-25 DIAGNOSIS — N2581 Secondary hyperparathyroidism of renal origin: Secondary | ICD-10-CM | POA: Diagnosis not present

## 2021-12-25 DIAGNOSIS — N186 End stage renal disease: Secondary | ICD-10-CM | POA: Diagnosis not present

## 2021-12-25 DIAGNOSIS — Z992 Dependence on renal dialysis: Secondary | ICD-10-CM | POA: Diagnosis not present

## 2021-12-26 DIAGNOSIS — Z992 Dependence on renal dialysis: Secondary | ICD-10-CM | POA: Diagnosis not present

## 2021-12-26 DIAGNOSIS — I129 Hypertensive chronic kidney disease with stage 1 through stage 4 chronic kidney disease, or unspecified chronic kidney disease: Secondary | ICD-10-CM | POA: Diagnosis not present

## 2021-12-26 DIAGNOSIS — N186 End stage renal disease: Secondary | ICD-10-CM | POA: Diagnosis not present

## 2021-12-27 DIAGNOSIS — N186 End stage renal disease: Secondary | ICD-10-CM | POA: Diagnosis not present

## 2021-12-27 DIAGNOSIS — Z992 Dependence on renal dialysis: Secondary | ICD-10-CM | POA: Diagnosis not present

## 2021-12-27 DIAGNOSIS — N2581 Secondary hyperparathyroidism of renal origin: Secondary | ICD-10-CM | POA: Diagnosis not present

## 2021-12-30 DIAGNOSIS — N2581 Secondary hyperparathyroidism of renal origin: Secondary | ICD-10-CM | POA: Diagnosis not present

## 2021-12-30 DIAGNOSIS — N186 End stage renal disease: Secondary | ICD-10-CM | POA: Diagnosis not present

## 2021-12-30 DIAGNOSIS — Z992 Dependence on renal dialysis: Secondary | ICD-10-CM | POA: Diagnosis not present

## 2022-01-01 DIAGNOSIS — Z992 Dependence on renal dialysis: Secondary | ICD-10-CM | POA: Diagnosis not present

## 2022-01-01 DIAGNOSIS — N2581 Secondary hyperparathyroidism of renal origin: Secondary | ICD-10-CM | POA: Diagnosis not present

## 2022-01-01 DIAGNOSIS — N186 End stage renal disease: Secondary | ICD-10-CM | POA: Diagnosis not present

## 2022-01-03 DIAGNOSIS — N186 End stage renal disease: Secondary | ICD-10-CM | POA: Diagnosis not present

## 2022-01-03 DIAGNOSIS — N2581 Secondary hyperparathyroidism of renal origin: Secondary | ICD-10-CM | POA: Diagnosis not present

## 2022-01-03 DIAGNOSIS — Z992 Dependence on renal dialysis: Secondary | ICD-10-CM | POA: Diagnosis not present

## 2022-01-06 DIAGNOSIS — N186 End stage renal disease: Secondary | ICD-10-CM | POA: Diagnosis not present

## 2022-01-06 DIAGNOSIS — N2581 Secondary hyperparathyroidism of renal origin: Secondary | ICD-10-CM | POA: Diagnosis not present

## 2022-01-06 DIAGNOSIS — Z992 Dependence on renal dialysis: Secondary | ICD-10-CM | POA: Diagnosis not present

## 2022-01-08 DIAGNOSIS — Z992 Dependence on renal dialysis: Secondary | ICD-10-CM | POA: Diagnosis not present

## 2022-01-08 DIAGNOSIS — N186 End stage renal disease: Secondary | ICD-10-CM | POA: Diagnosis not present

## 2022-01-08 DIAGNOSIS — N2581 Secondary hyperparathyroidism of renal origin: Secondary | ICD-10-CM | POA: Diagnosis not present

## 2022-01-10 DIAGNOSIS — N2581 Secondary hyperparathyroidism of renal origin: Secondary | ICD-10-CM | POA: Diagnosis not present

## 2022-01-10 DIAGNOSIS — Z992 Dependence on renal dialysis: Secondary | ICD-10-CM | POA: Diagnosis not present

## 2022-01-10 DIAGNOSIS — N186 End stage renal disease: Secondary | ICD-10-CM | POA: Diagnosis not present

## 2022-01-10 DIAGNOSIS — Z6829 Body mass index (BMI) 29.0-29.9, adult: Secondary | ICD-10-CM | POA: Diagnosis not present

## 2022-01-10 DIAGNOSIS — J069 Acute upper respiratory infection, unspecified: Secondary | ICD-10-CM | POA: Diagnosis not present

## 2022-01-13 DIAGNOSIS — R52 Pain, unspecified: Secondary | ICD-10-CM | POA: Diagnosis not present

## 2022-01-13 DIAGNOSIS — N186 End stage renal disease: Secondary | ICD-10-CM | POA: Diagnosis not present

## 2022-01-13 DIAGNOSIS — Z992 Dependence on renal dialysis: Secondary | ICD-10-CM | POA: Diagnosis not present

## 2022-01-13 DIAGNOSIS — N2581 Secondary hyperparathyroidism of renal origin: Secondary | ICD-10-CM | POA: Diagnosis not present

## 2022-01-14 DIAGNOSIS — J069 Acute upper respiratory infection, unspecified: Secondary | ICD-10-CM | POA: Diagnosis not present

## 2022-01-14 DIAGNOSIS — Z6829 Body mass index (BMI) 29.0-29.9, adult: Secondary | ICD-10-CM | POA: Diagnosis not present

## 2022-01-14 DIAGNOSIS — R059 Cough, unspecified: Secondary | ICD-10-CM | POA: Diagnosis not present

## 2022-01-14 DIAGNOSIS — R5381 Other malaise: Secondary | ICD-10-CM | POA: Diagnosis not present

## 2022-01-15 DIAGNOSIS — R52 Pain, unspecified: Secondary | ICD-10-CM | POA: Diagnosis not present

## 2022-01-15 DIAGNOSIS — Z992 Dependence on renal dialysis: Secondary | ICD-10-CM | POA: Diagnosis not present

## 2022-01-15 DIAGNOSIS — N186 End stage renal disease: Secondary | ICD-10-CM | POA: Diagnosis not present

## 2022-01-15 DIAGNOSIS — N2581 Secondary hyperparathyroidism of renal origin: Secondary | ICD-10-CM | POA: Diagnosis not present

## 2022-01-17 DIAGNOSIS — Z992 Dependence on renal dialysis: Secondary | ICD-10-CM | POA: Diagnosis not present

## 2022-01-17 DIAGNOSIS — N186 End stage renal disease: Secondary | ICD-10-CM | POA: Diagnosis not present

## 2022-01-17 DIAGNOSIS — N2581 Secondary hyperparathyroidism of renal origin: Secondary | ICD-10-CM | POA: Diagnosis not present

## 2022-01-17 DIAGNOSIS — R52 Pain, unspecified: Secondary | ICD-10-CM | POA: Diagnosis not present

## 2022-01-20 DIAGNOSIS — Z992 Dependence on renal dialysis: Secondary | ICD-10-CM | POA: Diagnosis not present

## 2022-01-20 DIAGNOSIS — N2581 Secondary hyperparathyroidism of renal origin: Secondary | ICD-10-CM | POA: Diagnosis not present

## 2022-01-20 DIAGNOSIS — N186 End stage renal disease: Secondary | ICD-10-CM | POA: Diagnosis not present

## 2022-01-22 DIAGNOSIS — N186 End stage renal disease: Secondary | ICD-10-CM | POA: Diagnosis not present

## 2022-01-22 DIAGNOSIS — Z992 Dependence on renal dialysis: Secondary | ICD-10-CM | POA: Diagnosis not present

## 2022-01-22 DIAGNOSIS — N2581 Secondary hyperparathyroidism of renal origin: Secondary | ICD-10-CM | POA: Diagnosis not present

## 2022-01-23 DIAGNOSIS — G629 Polyneuropathy, unspecified: Secondary | ICD-10-CM | POA: Diagnosis not present

## 2022-01-23 DIAGNOSIS — M19079 Primary osteoarthritis, unspecified ankle and foot: Secondary | ICD-10-CM | POA: Diagnosis not present

## 2022-01-23 DIAGNOSIS — M21621 Bunionette of right foot: Secondary | ICD-10-CM | POA: Diagnosis not present

## 2022-01-23 DIAGNOSIS — M19071 Primary osteoarthritis, right ankle and foot: Secondary | ICD-10-CM | POA: Diagnosis not present

## 2022-01-23 DIAGNOSIS — M19072 Primary osteoarthritis, left ankle and foot: Secondary | ICD-10-CM | POA: Diagnosis not present

## 2022-01-23 DIAGNOSIS — M21622 Bunionette of left foot: Secondary | ICD-10-CM | POA: Diagnosis not present

## 2022-01-24 DIAGNOSIS — N186 End stage renal disease: Secondary | ICD-10-CM | POA: Diagnosis not present

## 2022-01-24 DIAGNOSIS — Z992 Dependence on renal dialysis: Secondary | ICD-10-CM | POA: Diagnosis not present

## 2022-01-24 DIAGNOSIS — N2581 Secondary hyperparathyroidism of renal origin: Secondary | ICD-10-CM | POA: Diagnosis not present

## 2022-01-25 DIAGNOSIS — N186 End stage renal disease: Secondary | ICD-10-CM | POA: Diagnosis not present

## 2022-01-25 DIAGNOSIS — Z992 Dependence on renal dialysis: Secondary | ICD-10-CM | POA: Diagnosis not present

## 2022-01-25 DIAGNOSIS — I129 Hypertensive chronic kidney disease with stage 1 through stage 4 chronic kidney disease, or unspecified chronic kidney disease: Secondary | ICD-10-CM | POA: Diagnosis not present

## 2022-01-27 DIAGNOSIS — Z992 Dependence on renal dialysis: Secondary | ICD-10-CM | POA: Diagnosis not present

## 2022-01-27 DIAGNOSIS — Z23 Encounter for immunization: Secondary | ICD-10-CM | POA: Diagnosis not present

## 2022-01-27 DIAGNOSIS — N186 End stage renal disease: Secondary | ICD-10-CM | POA: Diagnosis not present

## 2022-01-27 DIAGNOSIS — N2581 Secondary hyperparathyroidism of renal origin: Secondary | ICD-10-CM | POA: Diagnosis not present

## 2022-01-29 DIAGNOSIS — N186 End stage renal disease: Secondary | ICD-10-CM | POA: Diagnosis not present

## 2022-01-29 DIAGNOSIS — Z992 Dependence on renal dialysis: Secondary | ICD-10-CM | POA: Diagnosis not present

## 2022-01-29 DIAGNOSIS — N2581 Secondary hyperparathyroidism of renal origin: Secondary | ICD-10-CM | POA: Diagnosis not present

## 2022-01-29 DIAGNOSIS — Z23 Encounter for immunization: Secondary | ICD-10-CM | POA: Diagnosis not present

## 2022-01-31 DIAGNOSIS — Z992 Dependence on renal dialysis: Secondary | ICD-10-CM | POA: Diagnosis not present

## 2022-01-31 DIAGNOSIS — Z23 Encounter for immunization: Secondary | ICD-10-CM | POA: Diagnosis not present

## 2022-01-31 DIAGNOSIS — N2581 Secondary hyperparathyroidism of renal origin: Secondary | ICD-10-CM | POA: Diagnosis not present

## 2022-01-31 DIAGNOSIS — N186 End stage renal disease: Secondary | ICD-10-CM | POA: Diagnosis not present

## 2022-02-03 DIAGNOSIS — N2581 Secondary hyperparathyroidism of renal origin: Secondary | ICD-10-CM | POA: Diagnosis not present

## 2022-02-03 DIAGNOSIS — Z992 Dependence on renal dialysis: Secondary | ICD-10-CM | POA: Diagnosis not present

## 2022-02-03 DIAGNOSIS — N186 End stage renal disease: Secondary | ICD-10-CM | POA: Diagnosis not present

## 2022-02-05 DIAGNOSIS — Z992 Dependence on renal dialysis: Secondary | ICD-10-CM | POA: Diagnosis not present

## 2022-02-05 DIAGNOSIS — N2581 Secondary hyperparathyroidism of renal origin: Secondary | ICD-10-CM | POA: Diagnosis not present

## 2022-02-05 DIAGNOSIS — N186 End stage renal disease: Secondary | ICD-10-CM | POA: Diagnosis not present

## 2022-02-07 DIAGNOSIS — N186 End stage renal disease: Secondary | ICD-10-CM | POA: Diagnosis not present

## 2022-02-07 DIAGNOSIS — Z992 Dependence on renal dialysis: Secondary | ICD-10-CM | POA: Diagnosis not present

## 2022-02-07 DIAGNOSIS — N2581 Secondary hyperparathyroidism of renal origin: Secondary | ICD-10-CM | POA: Diagnosis not present

## 2022-02-10 DIAGNOSIS — Z992 Dependence on renal dialysis: Secondary | ICD-10-CM | POA: Diagnosis not present

## 2022-02-10 DIAGNOSIS — N186 End stage renal disease: Secondary | ICD-10-CM | POA: Diagnosis not present

## 2022-02-10 DIAGNOSIS — N2581 Secondary hyperparathyroidism of renal origin: Secondary | ICD-10-CM | POA: Diagnosis not present

## 2022-02-12 DIAGNOSIS — Z992 Dependence on renal dialysis: Secondary | ICD-10-CM | POA: Diagnosis not present

## 2022-02-12 DIAGNOSIS — N2581 Secondary hyperparathyroidism of renal origin: Secondary | ICD-10-CM | POA: Diagnosis not present

## 2022-02-12 DIAGNOSIS — N186 End stage renal disease: Secondary | ICD-10-CM | POA: Diagnosis not present

## 2022-02-14 DIAGNOSIS — N2581 Secondary hyperparathyroidism of renal origin: Secondary | ICD-10-CM | POA: Diagnosis not present

## 2022-02-14 DIAGNOSIS — Z992 Dependence on renal dialysis: Secondary | ICD-10-CM | POA: Diagnosis not present

## 2022-02-14 DIAGNOSIS — N186 End stage renal disease: Secondary | ICD-10-CM | POA: Diagnosis not present

## 2022-02-17 DIAGNOSIS — N186 End stage renal disease: Secondary | ICD-10-CM | POA: Diagnosis not present

## 2022-02-17 DIAGNOSIS — Z992 Dependence on renal dialysis: Secondary | ICD-10-CM | POA: Diagnosis not present

## 2022-02-17 DIAGNOSIS — N2581 Secondary hyperparathyroidism of renal origin: Secondary | ICD-10-CM | POA: Diagnosis not present

## 2022-02-19 DIAGNOSIS — N186 End stage renal disease: Secondary | ICD-10-CM | POA: Diagnosis not present

## 2022-02-19 DIAGNOSIS — Z992 Dependence on renal dialysis: Secondary | ICD-10-CM | POA: Diagnosis not present

## 2022-02-19 DIAGNOSIS — N2581 Secondary hyperparathyroidism of renal origin: Secondary | ICD-10-CM | POA: Diagnosis not present

## 2022-02-21 DIAGNOSIS — N2581 Secondary hyperparathyroidism of renal origin: Secondary | ICD-10-CM | POA: Diagnosis not present

## 2022-02-21 DIAGNOSIS — N186 End stage renal disease: Secondary | ICD-10-CM | POA: Diagnosis not present

## 2022-02-21 DIAGNOSIS — Z992 Dependence on renal dialysis: Secondary | ICD-10-CM | POA: Diagnosis not present

## 2022-02-24 DIAGNOSIS — N186 End stage renal disease: Secondary | ICD-10-CM | POA: Diagnosis not present

## 2022-02-24 DIAGNOSIS — Z992 Dependence on renal dialysis: Secondary | ICD-10-CM | POA: Diagnosis not present

## 2022-02-24 DIAGNOSIS — N2581 Secondary hyperparathyroidism of renal origin: Secondary | ICD-10-CM | POA: Diagnosis not present

## 2022-02-25 DIAGNOSIS — I129 Hypertensive chronic kidney disease with stage 1 through stage 4 chronic kidney disease, or unspecified chronic kidney disease: Secondary | ICD-10-CM | POA: Diagnosis not present

## 2022-02-25 DIAGNOSIS — Z992 Dependence on renal dialysis: Secondary | ICD-10-CM | POA: Diagnosis not present

## 2022-02-25 DIAGNOSIS — N186 End stage renal disease: Secondary | ICD-10-CM | POA: Diagnosis not present

## 2022-02-28 DIAGNOSIS — Z992 Dependence on renal dialysis: Secondary | ICD-10-CM | POA: Diagnosis not present

## 2022-02-28 DIAGNOSIS — N186 End stage renal disease: Secondary | ICD-10-CM | POA: Diagnosis not present

## 2022-02-28 DIAGNOSIS — N2581 Secondary hyperparathyroidism of renal origin: Secondary | ICD-10-CM | POA: Diagnosis not present

## 2022-03-03 DIAGNOSIS — N186 End stage renal disease: Secondary | ICD-10-CM | POA: Diagnosis not present

## 2022-03-03 DIAGNOSIS — N2581 Secondary hyperparathyroidism of renal origin: Secondary | ICD-10-CM | POA: Diagnosis not present

## 2022-03-03 DIAGNOSIS — Z992 Dependence on renal dialysis: Secondary | ICD-10-CM | POA: Diagnosis not present

## 2022-03-03 DIAGNOSIS — Z6829 Body mass index (BMI) 29.0-29.9, adult: Secondary | ICD-10-CM | POA: Diagnosis not present

## 2022-03-03 DIAGNOSIS — R197 Diarrhea, unspecified: Secondary | ICD-10-CM | POA: Diagnosis not present

## 2022-03-03 DIAGNOSIS — R053 Chronic cough: Secondary | ICD-10-CM | POA: Diagnosis not present

## 2022-03-03 DIAGNOSIS — R059 Cough, unspecified: Secondary | ICD-10-CM | POA: Diagnosis not present

## 2022-03-05 DIAGNOSIS — Z992 Dependence on renal dialysis: Secondary | ICD-10-CM | POA: Diagnosis not present

## 2022-03-05 DIAGNOSIS — N2581 Secondary hyperparathyroidism of renal origin: Secondary | ICD-10-CM | POA: Diagnosis not present

## 2022-03-05 DIAGNOSIS — N186 End stage renal disease: Secondary | ICD-10-CM | POA: Diagnosis not present

## 2022-03-07 DIAGNOSIS — N186 End stage renal disease: Secondary | ICD-10-CM | POA: Diagnosis not present

## 2022-03-07 DIAGNOSIS — Z992 Dependence on renal dialysis: Secondary | ICD-10-CM | POA: Diagnosis not present

## 2022-03-07 DIAGNOSIS — N2581 Secondary hyperparathyroidism of renal origin: Secondary | ICD-10-CM | POA: Diagnosis not present

## 2022-03-10 DIAGNOSIS — N2581 Secondary hyperparathyroidism of renal origin: Secondary | ICD-10-CM | POA: Diagnosis not present

## 2022-03-10 DIAGNOSIS — Z992 Dependence on renal dialysis: Secondary | ICD-10-CM | POA: Diagnosis not present

## 2022-03-10 DIAGNOSIS — N186 End stage renal disease: Secondary | ICD-10-CM | POA: Diagnosis not present

## 2022-03-12 DIAGNOSIS — N186 End stage renal disease: Secondary | ICD-10-CM | POA: Diagnosis not present

## 2022-03-12 DIAGNOSIS — N2581 Secondary hyperparathyroidism of renal origin: Secondary | ICD-10-CM | POA: Diagnosis not present

## 2022-03-12 DIAGNOSIS — Z992 Dependence on renal dialysis: Secondary | ICD-10-CM | POA: Diagnosis not present

## 2022-03-14 DIAGNOSIS — Z992 Dependence on renal dialysis: Secondary | ICD-10-CM | POA: Diagnosis not present

## 2022-03-14 DIAGNOSIS — N186 End stage renal disease: Secondary | ICD-10-CM | POA: Diagnosis not present

## 2022-03-14 DIAGNOSIS — N2581 Secondary hyperparathyroidism of renal origin: Secondary | ICD-10-CM | POA: Diagnosis not present

## 2022-03-17 DIAGNOSIS — N2581 Secondary hyperparathyroidism of renal origin: Secondary | ICD-10-CM | POA: Diagnosis not present

## 2022-03-17 DIAGNOSIS — N186 End stage renal disease: Secondary | ICD-10-CM | POA: Diagnosis not present

## 2022-03-17 DIAGNOSIS — Z992 Dependence on renal dialysis: Secondary | ICD-10-CM | POA: Diagnosis not present

## 2022-03-17 DIAGNOSIS — R197 Diarrhea, unspecified: Secondary | ICD-10-CM | POA: Diagnosis not present

## 2022-03-19 DIAGNOSIS — Z992 Dependence on renal dialysis: Secondary | ICD-10-CM | POA: Diagnosis not present

## 2022-03-19 DIAGNOSIS — N186 End stage renal disease: Secondary | ICD-10-CM | POA: Diagnosis not present

## 2022-03-19 DIAGNOSIS — R197 Diarrhea, unspecified: Secondary | ICD-10-CM | POA: Diagnosis not present

## 2022-03-19 DIAGNOSIS — N2581 Secondary hyperparathyroidism of renal origin: Secondary | ICD-10-CM | POA: Diagnosis not present

## 2022-03-21 DIAGNOSIS — Z992 Dependence on renal dialysis: Secondary | ICD-10-CM | POA: Diagnosis not present

## 2022-03-21 DIAGNOSIS — N186 End stage renal disease: Secondary | ICD-10-CM | POA: Diagnosis not present

## 2022-03-21 DIAGNOSIS — R197 Diarrhea, unspecified: Secondary | ICD-10-CM | POA: Diagnosis not present

## 2022-03-21 DIAGNOSIS — N2581 Secondary hyperparathyroidism of renal origin: Secondary | ICD-10-CM | POA: Diagnosis not present

## 2022-03-24 DIAGNOSIS — R197 Diarrhea, unspecified: Secondary | ICD-10-CM | POA: Diagnosis not present

## 2022-03-24 DIAGNOSIS — N186 End stage renal disease: Secondary | ICD-10-CM | POA: Diagnosis not present

## 2022-03-24 DIAGNOSIS — N2581 Secondary hyperparathyroidism of renal origin: Secondary | ICD-10-CM | POA: Diagnosis not present

## 2022-03-24 DIAGNOSIS — Z992 Dependence on renal dialysis: Secondary | ICD-10-CM | POA: Diagnosis not present

## 2022-03-26 DIAGNOSIS — N2581 Secondary hyperparathyroidism of renal origin: Secondary | ICD-10-CM | POA: Diagnosis not present

## 2022-03-26 DIAGNOSIS — N186 End stage renal disease: Secondary | ICD-10-CM | POA: Diagnosis not present

## 2022-03-26 DIAGNOSIS — R197 Diarrhea, unspecified: Secondary | ICD-10-CM | POA: Diagnosis not present

## 2022-03-26 DIAGNOSIS — Z992 Dependence on renal dialysis: Secondary | ICD-10-CM | POA: Diagnosis not present

## 2022-03-27 DIAGNOSIS — I129 Hypertensive chronic kidney disease with stage 1 through stage 4 chronic kidney disease, or unspecified chronic kidney disease: Secondary | ICD-10-CM | POA: Diagnosis not present

## 2022-03-27 DIAGNOSIS — Z992 Dependence on renal dialysis: Secondary | ICD-10-CM | POA: Diagnosis not present

## 2022-03-27 DIAGNOSIS — N186 End stage renal disease: Secondary | ICD-10-CM | POA: Diagnosis not present

## 2022-03-27 DIAGNOSIS — I871 Compression of vein: Secondary | ICD-10-CM | POA: Diagnosis not present

## 2022-03-28 DIAGNOSIS — Z992 Dependence on renal dialysis: Secondary | ICD-10-CM | POA: Diagnosis not present

## 2022-03-28 DIAGNOSIS — N186 End stage renal disease: Secondary | ICD-10-CM | POA: Diagnosis not present

## 2022-03-28 DIAGNOSIS — N2581 Secondary hyperparathyroidism of renal origin: Secondary | ICD-10-CM | POA: Diagnosis not present

## 2022-04-01 DIAGNOSIS — N186 End stage renal disease: Secondary | ICD-10-CM | POA: Diagnosis not present

## 2022-04-01 DIAGNOSIS — Z992 Dependence on renal dialysis: Secondary | ICD-10-CM | POA: Diagnosis not present

## 2022-04-01 DIAGNOSIS — N2581 Secondary hyperparathyroidism of renal origin: Secondary | ICD-10-CM | POA: Diagnosis not present

## 2022-04-01 DIAGNOSIS — R197 Diarrhea, unspecified: Secondary | ICD-10-CM | POA: Diagnosis not present

## 2022-04-02 DIAGNOSIS — R197 Diarrhea, unspecified: Secondary | ICD-10-CM | POA: Diagnosis not present

## 2022-04-02 DIAGNOSIS — N186 End stage renal disease: Secondary | ICD-10-CM | POA: Diagnosis not present

## 2022-04-02 DIAGNOSIS — N2581 Secondary hyperparathyroidism of renal origin: Secondary | ICD-10-CM | POA: Diagnosis not present

## 2022-04-02 DIAGNOSIS — Z992 Dependence on renal dialysis: Secondary | ICD-10-CM | POA: Diagnosis not present

## 2022-04-04 DIAGNOSIS — Z992 Dependence on renal dialysis: Secondary | ICD-10-CM | POA: Diagnosis not present

## 2022-04-04 DIAGNOSIS — R197 Diarrhea, unspecified: Secondary | ICD-10-CM | POA: Diagnosis not present

## 2022-04-04 DIAGNOSIS — N186 End stage renal disease: Secondary | ICD-10-CM | POA: Diagnosis not present

## 2022-04-04 DIAGNOSIS — N2581 Secondary hyperparathyroidism of renal origin: Secondary | ICD-10-CM | POA: Diagnosis not present

## 2022-04-07 DIAGNOSIS — Z992 Dependence on renal dialysis: Secondary | ICD-10-CM | POA: Diagnosis not present

## 2022-04-07 DIAGNOSIS — N186 End stage renal disease: Secondary | ICD-10-CM | POA: Diagnosis not present

## 2022-04-07 DIAGNOSIS — N2581 Secondary hyperparathyroidism of renal origin: Secondary | ICD-10-CM | POA: Diagnosis not present

## 2022-04-09 DIAGNOSIS — Z992 Dependence on renal dialysis: Secondary | ICD-10-CM | POA: Diagnosis not present

## 2022-04-09 DIAGNOSIS — N2581 Secondary hyperparathyroidism of renal origin: Secondary | ICD-10-CM | POA: Diagnosis not present

## 2022-04-09 DIAGNOSIS — N186 End stage renal disease: Secondary | ICD-10-CM | POA: Diagnosis not present

## 2022-04-11 DIAGNOSIS — N186 End stage renal disease: Secondary | ICD-10-CM | POA: Diagnosis not present

## 2022-04-11 DIAGNOSIS — Z992 Dependence on renal dialysis: Secondary | ICD-10-CM | POA: Diagnosis not present

## 2022-04-11 DIAGNOSIS — N2581 Secondary hyperparathyroidism of renal origin: Secondary | ICD-10-CM | POA: Diagnosis not present

## 2022-04-14 DIAGNOSIS — N186 End stage renal disease: Secondary | ICD-10-CM | POA: Diagnosis not present

## 2022-04-14 DIAGNOSIS — Z992 Dependence on renal dialysis: Secondary | ICD-10-CM | POA: Diagnosis not present

## 2022-04-14 DIAGNOSIS — N2581 Secondary hyperparathyroidism of renal origin: Secondary | ICD-10-CM | POA: Diagnosis not present

## 2022-04-17 DIAGNOSIS — N2581 Secondary hyperparathyroidism of renal origin: Secondary | ICD-10-CM | POA: Diagnosis not present

## 2022-04-17 DIAGNOSIS — N186 End stage renal disease: Secondary | ICD-10-CM | POA: Diagnosis not present

## 2022-04-17 DIAGNOSIS — Z992 Dependence on renal dialysis: Secondary | ICD-10-CM | POA: Diagnosis not present

## 2022-04-18 DIAGNOSIS — N2581 Secondary hyperparathyroidism of renal origin: Secondary | ICD-10-CM | POA: Diagnosis not present

## 2022-04-18 DIAGNOSIS — Z992 Dependence on renal dialysis: Secondary | ICD-10-CM | POA: Diagnosis not present

## 2022-04-18 DIAGNOSIS — N186 End stage renal disease: Secondary | ICD-10-CM | POA: Diagnosis not present

## 2022-04-20 DIAGNOSIS — N186 End stage renal disease: Secondary | ICD-10-CM | POA: Diagnosis not present

## 2022-04-20 DIAGNOSIS — Z992 Dependence on renal dialysis: Secondary | ICD-10-CM | POA: Diagnosis not present

## 2022-04-20 DIAGNOSIS — R52 Pain, unspecified: Secondary | ICD-10-CM | POA: Diagnosis not present

## 2022-04-20 DIAGNOSIS — N2581 Secondary hyperparathyroidism of renal origin: Secondary | ICD-10-CM | POA: Diagnosis not present

## 2022-04-23 DIAGNOSIS — R52 Pain, unspecified: Secondary | ICD-10-CM | POA: Diagnosis not present

## 2022-04-23 DIAGNOSIS — Z992 Dependence on renal dialysis: Secondary | ICD-10-CM | POA: Diagnosis not present

## 2022-04-23 DIAGNOSIS — N2581 Secondary hyperparathyroidism of renal origin: Secondary | ICD-10-CM | POA: Diagnosis not present

## 2022-04-23 DIAGNOSIS — N186 End stage renal disease: Secondary | ICD-10-CM | POA: Diagnosis not present

## 2022-04-25 DIAGNOSIS — N186 End stage renal disease: Secondary | ICD-10-CM | POA: Diagnosis not present

## 2022-04-25 DIAGNOSIS — Z992 Dependence on renal dialysis: Secondary | ICD-10-CM | POA: Diagnosis not present

## 2022-04-25 DIAGNOSIS — N2581 Secondary hyperparathyroidism of renal origin: Secondary | ICD-10-CM | POA: Diagnosis not present

## 2022-04-25 DIAGNOSIS — R52 Pain, unspecified: Secondary | ICD-10-CM | POA: Diagnosis not present

## 2022-04-27 DIAGNOSIS — N2581 Secondary hyperparathyroidism of renal origin: Secondary | ICD-10-CM | POA: Diagnosis not present

## 2022-04-27 DIAGNOSIS — I129 Hypertensive chronic kidney disease with stage 1 through stage 4 chronic kidney disease, or unspecified chronic kidney disease: Secondary | ICD-10-CM | POA: Diagnosis not present

## 2022-04-27 DIAGNOSIS — N186 End stage renal disease: Secondary | ICD-10-CM | POA: Diagnosis not present

## 2022-04-27 DIAGNOSIS — Z992 Dependence on renal dialysis: Secondary | ICD-10-CM | POA: Diagnosis not present

## 2022-04-30 DIAGNOSIS — N186 End stage renal disease: Secondary | ICD-10-CM | POA: Diagnosis not present

## 2022-04-30 DIAGNOSIS — Z992 Dependence on renal dialysis: Secondary | ICD-10-CM | POA: Diagnosis not present

## 2022-04-30 DIAGNOSIS — N2581 Secondary hyperparathyroidism of renal origin: Secondary | ICD-10-CM | POA: Diagnosis not present

## 2022-05-01 DIAGNOSIS — E113493 Type 2 diabetes mellitus with severe nonproliferative diabetic retinopathy without macular edema, bilateral: Secondary | ICD-10-CM | POA: Diagnosis not present

## 2022-05-02 DIAGNOSIS — N186 End stage renal disease: Secondary | ICD-10-CM | POA: Diagnosis not present

## 2022-05-02 DIAGNOSIS — Z992 Dependence on renal dialysis: Secondary | ICD-10-CM | POA: Diagnosis not present

## 2022-05-02 DIAGNOSIS — N2581 Secondary hyperparathyroidism of renal origin: Secondary | ICD-10-CM | POA: Diagnosis not present

## 2022-05-06 DIAGNOSIS — Z992 Dependence on renal dialysis: Secondary | ICD-10-CM | POA: Diagnosis not present

## 2022-05-06 DIAGNOSIS — N2581 Secondary hyperparathyroidism of renal origin: Secondary | ICD-10-CM | POA: Diagnosis not present

## 2022-05-06 DIAGNOSIS — N186 End stage renal disease: Secondary | ICD-10-CM | POA: Diagnosis not present

## 2022-05-07 DIAGNOSIS — N186 End stage renal disease: Secondary | ICD-10-CM | POA: Diagnosis not present

## 2022-05-07 DIAGNOSIS — N2581 Secondary hyperparathyroidism of renal origin: Secondary | ICD-10-CM | POA: Diagnosis not present

## 2022-05-07 DIAGNOSIS — Z992 Dependence on renal dialysis: Secondary | ICD-10-CM | POA: Diagnosis not present

## 2022-05-09 DIAGNOSIS — N186 End stage renal disease: Secondary | ICD-10-CM | POA: Diagnosis not present

## 2022-05-09 DIAGNOSIS — N2581 Secondary hyperparathyroidism of renal origin: Secondary | ICD-10-CM | POA: Diagnosis not present

## 2022-05-09 DIAGNOSIS — Z992 Dependence on renal dialysis: Secondary | ICD-10-CM | POA: Diagnosis not present

## 2022-05-12 DIAGNOSIS — Z992 Dependence on renal dialysis: Secondary | ICD-10-CM | POA: Diagnosis not present

## 2022-05-12 DIAGNOSIS — N186 End stage renal disease: Secondary | ICD-10-CM | POA: Diagnosis not present

## 2022-05-12 DIAGNOSIS — N2581 Secondary hyperparathyroidism of renal origin: Secondary | ICD-10-CM | POA: Diagnosis not present

## 2022-05-14 DIAGNOSIS — N2581 Secondary hyperparathyroidism of renal origin: Secondary | ICD-10-CM | POA: Diagnosis not present

## 2022-05-14 DIAGNOSIS — N186 End stage renal disease: Secondary | ICD-10-CM | POA: Diagnosis not present

## 2022-05-14 DIAGNOSIS — Z992 Dependence on renal dialysis: Secondary | ICD-10-CM | POA: Diagnosis not present

## 2022-05-16 DIAGNOSIS — N186 End stage renal disease: Secondary | ICD-10-CM | POA: Diagnosis not present

## 2022-05-16 DIAGNOSIS — N2581 Secondary hyperparathyroidism of renal origin: Secondary | ICD-10-CM | POA: Diagnosis not present

## 2022-05-16 DIAGNOSIS — Z992 Dependence on renal dialysis: Secondary | ICD-10-CM | POA: Diagnosis not present

## 2022-05-19 DIAGNOSIS — R197 Diarrhea, unspecified: Secondary | ICD-10-CM | POA: Diagnosis not present

## 2022-05-19 DIAGNOSIS — Z992 Dependence on renal dialysis: Secondary | ICD-10-CM | POA: Diagnosis not present

## 2022-05-19 DIAGNOSIS — N186 End stage renal disease: Secondary | ICD-10-CM | POA: Diagnosis not present

## 2022-05-19 DIAGNOSIS — N2581 Secondary hyperparathyroidism of renal origin: Secondary | ICD-10-CM | POA: Diagnosis not present

## 2022-05-19 DIAGNOSIS — Z23 Encounter for immunization: Secondary | ICD-10-CM | POA: Diagnosis not present

## 2022-05-20 DIAGNOSIS — E785 Hyperlipidemia, unspecified: Secondary | ICD-10-CM | POA: Diagnosis not present

## 2022-05-20 DIAGNOSIS — K219 Gastro-esophageal reflux disease without esophagitis: Secondary | ICD-10-CM | POA: Diagnosis not present

## 2022-05-20 DIAGNOSIS — Z992 Dependence on renal dialysis: Secondary | ICD-10-CM | POA: Diagnosis not present

## 2022-05-20 DIAGNOSIS — I10 Essential (primary) hypertension: Secondary | ICD-10-CM | POA: Diagnosis not present

## 2022-05-21 DIAGNOSIS — N186 End stage renal disease: Secondary | ICD-10-CM | POA: Diagnosis not present

## 2022-05-21 DIAGNOSIS — N2581 Secondary hyperparathyroidism of renal origin: Secondary | ICD-10-CM | POA: Diagnosis not present

## 2022-05-21 DIAGNOSIS — Z23 Encounter for immunization: Secondary | ICD-10-CM | POA: Diagnosis not present

## 2022-05-21 DIAGNOSIS — Z992 Dependence on renal dialysis: Secondary | ICD-10-CM | POA: Diagnosis not present

## 2022-05-21 DIAGNOSIS — R197 Diarrhea, unspecified: Secondary | ICD-10-CM | POA: Diagnosis not present

## 2022-05-23 DIAGNOSIS — Z23 Encounter for immunization: Secondary | ICD-10-CM | POA: Diagnosis not present

## 2022-05-23 DIAGNOSIS — R197 Diarrhea, unspecified: Secondary | ICD-10-CM | POA: Diagnosis not present

## 2022-05-23 DIAGNOSIS — N186 End stage renal disease: Secondary | ICD-10-CM | POA: Diagnosis not present

## 2022-05-23 DIAGNOSIS — N2581 Secondary hyperparathyroidism of renal origin: Secondary | ICD-10-CM | POA: Diagnosis not present

## 2022-05-23 DIAGNOSIS — Z992 Dependence on renal dialysis: Secondary | ICD-10-CM | POA: Diagnosis not present

## 2022-05-26 DIAGNOSIS — N186 End stage renal disease: Secondary | ICD-10-CM | POA: Diagnosis not present

## 2022-05-26 DIAGNOSIS — N2581 Secondary hyperparathyroidism of renal origin: Secondary | ICD-10-CM | POA: Diagnosis not present

## 2022-05-26 DIAGNOSIS — Z992 Dependence on renal dialysis: Secondary | ICD-10-CM | POA: Diagnosis not present

## 2022-05-27 DIAGNOSIS — L821 Other seborrheic keratosis: Secondary | ICD-10-CM | POA: Diagnosis not present

## 2022-05-27 DIAGNOSIS — L820 Inflamed seborrheic keratosis: Secondary | ICD-10-CM | POA: Diagnosis not present

## 2022-05-27 DIAGNOSIS — L219 Seborrheic dermatitis, unspecified: Secondary | ICD-10-CM | POA: Diagnosis not present

## 2022-05-27 DIAGNOSIS — L304 Erythema intertrigo: Secondary | ICD-10-CM | POA: Diagnosis not present

## 2022-05-28 DIAGNOSIS — N186 End stage renal disease: Secondary | ICD-10-CM | POA: Diagnosis not present

## 2022-05-28 DIAGNOSIS — I129 Hypertensive chronic kidney disease with stage 1 through stage 4 chronic kidney disease, or unspecified chronic kidney disease: Secondary | ICD-10-CM | POA: Diagnosis not present

## 2022-05-28 DIAGNOSIS — N2581 Secondary hyperparathyroidism of renal origin: Secondary | ICD-10-CM | POA: Diagnosis not present

## 2022-05-28 DIAGNOSIS — Z992 Dependence on renal dialysis: Secondary | ICD-10-CM | POA: Diagnosis not present

## 2022-05-30 DIAGNOSIS — N186 End stage renal disease: Secondary | ICD-10-CM | POA: Diagnosis not present

## 2022-05-30 DIAGNOSIS — N2581 Secondary hyperparathyroidism of renal origin: Secondary | ICD-10-CM | POA: Diagnosis not present

## 2022-05-30 DIAGNOSIS — Z992 Dependence on renal dialysis: Secondary | ICD-10-CM | POA: Diagnosis not present

## 2022-06-02 DIAGNOSIS — N2581 Secondary hyperparathyroidism of renal origin: Secondary | ICD-10-CM | POA: Diagnosis not present

## 2022-06-02 DIAGNOSIS — Z992 Dependence on renal dialysis: Secondary | ICD-10-CM | POA: Diagnosis not present

## 2022-06-02 DIAGNOSIS — N186 End stage renal disease: Secondary | ICD-10-CM | POA: Diagnosis not present

## 2022-06-04 DIAGNOSIS — N186 End stage renal disease: Secondary | ICD-10-CM | POA: Diagnosis not present

## 2022-06-04 DIAGNOSIS — N2581 Secondary hyperparathyroidism of renal origin: Secondary | ICD-10-CM | POA: Diagnosis not present

## 2022-06-04 DIAGNOSIS — Z992 Dependence on renal dialysis: Secondary | ICD-10-CM | POA: Diagnosis not present

## 2022-06-06 DIAGNOSIS — N186 End stage renal disease: Secondary | ICD-10-CM | POA: Diagnosis not present

## 2022-06-06 DIAGNOSIS — N2581 Secondary hyperparathyroidism of renal origin: Secondary | ICD-10-CM | POA: Diagnosis not present

## 2022-06-06 DIAGNOSIS — Z992 Dependence on renal dialysis: Secondary | ICD-10-CM | POA: Diagnosis not present

## 2022-06-09 DIAGNOSIS — N186 End stage renal disease: Secondary | ICD-10-CM | POA: Diagnosis not present

## 2022-06-09 DIAGNOSIS — N2581 Secondary hyperparathyroidism of renal origin: Secondary | ICD-10-CM | POA: Diagnosis not present

## 2022-06-09 DIAGNOSIS — Z992 Dependence on renal dialysis: Secondary | ICD-10-CM | POA: Diagnosis not present

## 2022-06-11 DIAGNOSIS — Z992 Dependence on renal dialysis: Secondary | ICD-10-CM | POA: Diagnosis not present

## 2022-06-11 DIAGNOSIS — N186 End stage renal disease: Secondary | ICD-10-CM | POA: Diagnosis not present

## 2022-06-11 DIAGNOSIS — N2581 Secondary hyperparathyroidism of renal origin: Secondary | ICD-10-CM | POA: Diagnosis not present

## 2022-06-13 DIAGNOSIS — N186 End stage renal disease: Secondary | ICD-10-CM | POA: Diagnosis not present

## 2022-06-13 DIAGNOSIS — Z992 Dependence on renal dialysis: Secondary | ICD-10-CM | POA: Diagnosis not present

## 2022-06-13 DIAGNOSIS — N2581 Secondary hyperparathyroidism of renal origin: Secondary | ICD-10-CM | POA: Diagnosis not present

## 2022-06-16 DIAGNOSIS — Z992 Dependence on renal dialysis: Secondary | ICD-10-CM | POA: Diagnosis not present

## 2022-06-16 DIAGNOSIS — N2581 Secondary hyperparathyroidism of renal origin: Secondary | ICD-10-CM | POA: Diagnosis not present

## 2022-06-16 DIAGNOSIS — N186 End stage renal disease: Secondary | ICD-10-CM | POA: Diagnosis not present

## 2022-06-18 DIAGNOSIS — N186 End stage renal disease: Secondary | ICD-10-CM | POA: Diagnosis not present

## 2022-06-18 DIAGNOSIS — Z992 Dependence on renal dialysis: Secondary | ICD-10-CM | POA: Diagnosis not present

## 2022-06-18 DIAGNOSIS — N2581 Secondary hyperparathyroidism of renal origin: Secondary | ICD-10-CM | POA: Diagnosis not present

## 2022-06-20 DIAGNOSIS — N186 End stage renal disease: Secondary | ICD-10-CM | POA: Diagnosis not present

## 2022-06-20 DIAGNOSIS — N2581 Secondary hyperparathyroidism of renal origin: Secondary | ICD-10-CM | POA: Diagnosis not present

## 2022-06-20 DIAGNOSIS — Z992 Dependence on renal dialysis: Secondary | ICD-10-CM | POA: Diagnosis not present

## 2022-06-23 DIAGNOSIS — N2581 Secondary hyperparathyroidism of renal origin: Secondary | ICD-10-CM | POA: Diagnosis not present

## 2022-06-23 DIAGNOSIS — N186 End stage renal disease: Secondary | ICD-10-CM | POA: Diagnosis not present

## 2022-06-23 DIAGNOSIS — Z992 Dependence on renal dialysis: Secondary | ICD-10-CM | POA: Diagnosis not present

## 2022-06-25 DIAGNOSIS — Z992 Dependence on renal dialysis: Secondary | ICD-10-CM | POA: Diagnosis not present

## 2022-06-25 DIAGNOSIS — N2581 Secondary hyperparathyroidism of renal origin: Secondary | ICD-10-CM | POA: Diagnosis not present

## 2022-06-25 DIAGNOSIS — N186 End stage renal disease: Secondary | ICD-10-CM | POA: Diagnosis not present

## 2022-06-26 DIAGNOSIS — I129 Hypertensive chronic kidney disease with stage 1 through stage 4 chronic kidney disease, or unspecified chronic kidney disease: Secondary | ICD-10-CM | POA: Diagnosis not present

## 2022-06-26 DIAGNOSIS — N186 End stage renal disease: Secondary | ICD-10-CM | POA: Diagnosis not present

## 2022-06-26 DIAGNOSIS — Z992 Dependence on renal dialysis: Secondary | ICD-10-CM | POA: Diagnosis not present

## 2022-06-27 DIAGNOSIS — N2581 Secondary hyperparathyroidism of renal origin: Secondary | ICD-10-CM | POA: Diagnosis not present

## 2022-06-27 DIAGNOSIS — Z992 Dependence on renal dialysis: Secondary | ICD-10-CM | POA: Diagnosis not present

## 2022-06-27 DIAGNOSIS — N186 End stage renal disease: Secondary | ICD-10-CM | POA: Diagnosis not present

## 2022-06-30 DIAGNOSIS — Z992 Dependence on renal dialysis: Secondary | ICD-10-CM | POA: Diagnosis not present

## 2022-06-30 DIAGNOSIS — N186 End stage renal disease: Secondary | ICD-10-CM | POA: Diagnosis not present

## 2022-06-30 DIAGNOSIS — N2581 Secondary hyperparathyroidism of renal origin: Secondary | ICD-10-CM | POA: Diagnosis not present

## 2022-07-02 DIAGNOSIS — N186 End stage renal disease: Secondary | ICD-10-CM | POA: Diagnosis not present

## 2022-07-02 DIAGNOSIS — N2581 Secondary hyperparathyroidism of renal origin: Secondary | ICD-10-CM | POA: Diagnosis not present

## 2022-07-02 DIAGNOSIS — Z992 Dependence on renal dialysis: Secondary | ICD-10-CM | POA: Diagnosis not present

## 2022-07-04 DIAGNOSIS — N186 End stage renal disease: Secondary | ICD-10-CM | POA: Diagnosis not present

## 2022-07-04 DIAGNOSIS — N2581 Secondary hyperparathyroidism of renal origin: Secondary | ICD-10-CM | POA: Diagnosis not present

## 2022-07-04 DIAGNOSIS — Z992 Dependence on renal dialysis: Secondary | ICD-10-CM | POA: Diagnosis not present

## 2022-07-07 DIAGNOSIS — N2581 Secondary hyperparathyroidism of renal origin: Secondary | ICD-10-CM | POA: Diagnosis not present

## 2022-07-07 DIAGNOSIS — Z992 Dependence on renal dialysis: Secondary | ICD-10-CM | POA: Diagnosis not present

## 2022-07-07 DIAGNOSIS — N186 End stage renal disease: Secondary | ICD-10-CM | POA: Diagnosis not present

## 2022-07-08 DIAGNOSIS — M9904 Segmental and somatic dysfunction of sacral region: Secondary | ICD-10-CM | POA: Diagnosis not present

## 2022-07-08 DIAGNOSIS — M9903 Segmental and somatic dysfunction of lumbar region: Secondary | ICD-10-CM | POA: Diagnosis not present

## 2022-07-08 DIAGNOSIS — M7918 Myalgia, other site: Secondary | ICD-10-CM | POA: Diagnosis not present

## 2022-07-08 DIAGNOSIS — M9905 Segmental and somatic dysfunction of pelvic region: Secondary | ICD-10-CM | POA: Diagnosis not present

## 2022-07-09 DIAGNOSIS — Z992 Dependence on renal dialysis: Secondary | ICD-10-CM | POA: Diagnosis not present

## 2022-07-09 DIAGNOSIS — N2581 Secondary hyperparathyroidism of renal origin: Secondary | ICD-10-CM | POA: Diagnosis not present

## 2022-07-09 DIAGNOSIS — N186 End stage renal disease: Secondary | ICD-10-CM | POA: Diagnosis not present

## 2022-07-10 DIAGNOSIS — M9903 Segmental and somatic dysfunction of lumbar region: Secondary | ICD-10-CM | POA: Diagnosis not present

## 2022-07-10 DIAGNOSIS — M7918 Myalgia, other site: Secondary | ICD-10-CM | POA: Diagnosis not present

## 2022-07-10 DIAGNOSIS — M9904 Segmental and somatic dysfunction of sacral region: Secondary | ICD-10-CM | POA: Diagnosis not present

## 2022-07-10 DIAGNOSIS — M9905 Segmental and somatic dysfunction of pelvic region: Secondary | ICD-10-CM | POA: Diagnosis not present

## 2022-07-11 DIAGNOSIS — N2581 Secondary hyperparathyroidism of renal origin: Secondary | ICD-10-CM | POA: Diagnosis not present

## 2022-07-11 DIAGNOSIS — N186 End stage renal disease: Secondary | ICD-10-CM | POA: Diagnosis not present

## 2022-07-11 DIAGNOSIS — Z992 Dependence on renal dialysis: Secondary | ICD-10-CM | POA: Diagnosis not present

## 2022-07-14 DIAGNOSIS — Z992 Dependence on renal dialysis: Secondary | ICD-10-CM | POA: Diagnosis not present

## 2022-07-14 DIAGNOSIS — R197 Diarrhea, unspecified: Secondary | ICD-10-CM | POA: Diagnosis not present

## 2022-07-14 DIAGNOSIS — N2581 Secondary hyperparathyroidism of renal origin: Secondary | ICD-10-CM | POA: Diagnosis not present

## 2022-07-14 DIAGNOSIS — N186 End stage renal disease: Secondary | ICD-10-CM | POA: Diagnosis not present

## 2022-07-15 DIAGNOSIS — M9904 Segmental and somatic dysfunction of sacral region: Secondary | ICD-10-CM | POA: Diagnosis not present

## 2022-07-15 DIAGNOSIS — M9905 Segmental and somatic dysfunction of pelvic region: Secondary | ICD-10-CM | POA: Diagnosis not present

## 2022-07-15 DIAGNOSIS — M7918 Myalgia, other site: Secondary | ICD-10-CM | POA: Diagnosis not present

## 2022-07-15 DIAGNOSIS — M9903 Segmental and somatic dysfunction of lumbar region: Secondary | ICD-10-CM | POA: Diagnosis not present

## 2022-07-16 DIAGNOSIS — N186 End stage renal disease: Secondary | ICD-10-CM | POA: Diagnosis not present

## 2022-07-16 DIAGNOSIS — Z992 Dependence on renal dialysis: Secondary | ICD-10-CM | POA: Diagnosis not present

## 2022-07-16 DIAGNOSIS — N2581 Secondary hyperparathyroidism of renal origin: Secondary | ICD-10-CM | POA: Diagnosis not present

## 2022-07-16 DIAGNOSIS — R197 Diarrhea, unspecified: Secondary | ICD-10-CM | POA: Diagnosis not present

## 2022-07-17 DIAGNOSIS — M9905 Segmental and somatic dysfunction of pelvic region: Secondary | ICD-10-CM | POA: Diagnosis not present

## 2022-07-17 DIAGNOSIS — M9903 Segmental and somatic dysfunction of lumbar region: Secondary | ICD-10-CM | POA: Diagnosis not present

## 2022-07-17 DIAGNOSIS — M9904 Segmental and somatic dysfunction of sacral region: Secondary | ICD-10-CM | POA: Diagnosis not present

## 2022-07-17 DIAGNOSIS — M7918 Myalgia, other site: Secondary | ICD-10-CM | POA: Diagnosis not present

## 2022-07-18 DIAGNOSIS — N186 End stage renal disease: Secondary | ICD-10-CM | POA: Diagnosis not present

## 2022-07-18 DIAGNOSIS — N2581 Secondary hyperparathyroidism of renal origin: Secondary | ICD-10-CM | POA: Diagnosis not present

## 2022-07-18 DIAGNOSIS — R197 Diarrhea, unspecified: Secondary | ICD-10-CM | POA: Diagnosis not present

## 2022-07-18 DIAGNOSIS — Z992 Dependence on renal dialysis: Secondary | ICD-10-CM | POA: Diagnosis not present

## 2022-07-21 DIAGNOSIS — Z992 Dependence on renal dialysis: Secondary | ICD-10-CM | POA: Diagnosis not present

## 2022-07-21 DIAGNOSIS — N186 End stage renal disease: Secondary | ICD-10-CM | POA: Diagnosis not present

## 2022-07-21 DIAGNOSIS — N2581 Secondary hyperparathyroidism of renal origin: Secondary | ICD-10-CM | POA: Diagnosis not present

## 2022-07-22 DIAGNOSIS — M7918 Myalgia, other site: Secondary | ICD-10-CM | POA: Diagnosis not present

## 2022-07-22 DIAGNOSIS — M9904 Segmental and somatic dysfunction of sacral region: Secondary | ICD-10-CM | POA: Diagnosis not present

## 2022-07-22 DIAGNOSIS — M9905 Segmental and somatic dysfunction of pelvic region: Secondary | ICD-10-CM | POA: Diagnosis not present

## 2022-07-22 DIAGNOSIS — M9903 Segmental and somatic dysfunction of lumbar region: Secondary | ICD-10-CM | POA: Diagnosis not present

## 2022-07-23 DIAGNOSIS — N186 End stage renal disease: Secondary | ICD-10-CM | POA: Diagnosis not present

## 2022-07-23 DIAGNOSIS — N2581 Secondary hyperparathyroidism of renal origin: Secondary | ICD-10-CM | POA: Diagnosis not present

## 2022-07-23 DIAGNOSIS — Z992 Dependence on renal dialysis: Secondary | ICD-10-CM | POA: Diagnosis not present

## 2022-07-24 DIAGNOSIS — M7918 Myalgia, other site: Secondary | ICD-10-CM | POA: Diagnosis not present

## 2022-07-24 DIAGNOSIS — M9903 Segmental and somatic dysfunction of lumbar region: Secondary | ICD-10-CM | POA: Diagnosis not present

## 2022-07-24 DIAGNOSIS — M9904 Segmental and somatic dysfunction of sacral region: Secondary | ICD-10-CM | POA: Diagnosis not present

## 2022-07-24 DIAGNOSIS — M9905 Segmental and somatic dysfunction of pelvic region: Secondary | ICD-10-CM | POA: Diagnosis not present

## 2022-07-25 DIAGNOSIS — N2581 Secondary hyperparathyroidism of renal origin: Secondary | ICD-10-CM | POA: Diagnosis not present

## 2022-07-25 DIAGNOSIS — Z992 Dependence on renal dialysis: Secondary | ICD-10-CM | POA: Diagnosis not present

## 2022-07-25 DIAGNOSIS — N186 End stage renal disease: Secondary | ICD-10-CM | POA: Diagnosis not present

## 2022-07-27 DIAGNOSIS — Z992 Dependence on renal dialysis: Secondary | ICD-10-CM | POA: Diagnosis not present

## 2022-07-27 DIAGNOSIS — I129 Hypertensive chronic kidney disease with stage 1 through stage 4 chronic kidney disease, or unspecified chronic kidney disease: Secondary | ICD-10-CM | POA: Diagnosis not present

## 2022-07-27 DIAGNOSIS — N186 End stage renal disease: Secondary | ICD-10-CM | POA: Diagnosis not present

## 2022-07-28 DIAGNOSIS — N186 End stage renal disease: Secondary | ICD-10-CM | POA: Diagnosis not present

## 2022-07-28 DIAGNOSIS — N2581 Secondary hyperparathyroidism of renal origin: Secondary | ICD-10-CM | POA: Diagnosis not present

## 2022-07-28 DIAGNOSIS — Z992 Dependence on renal dialysis: Secondary | ICD-10-CM | POA: Diagnosis not present

## 2022-07-29 DIAGNOSIS — M9903 Segmental and somatic dysfunction of lumbar region: Secondary | ICD-10-CM | POA: Diagnosis not present

## 2022-07-29 DIAGNOSIS — M9905 Segmental and somatic dysfunction of pelvic region: Secondary | ICD-10-CM | POA: Diagnosis not present

## 2022-07-29 DIAGNOSIS — M9904 Segmental and somatic dysfunction of sacral region: Secondary | ICD-10-CM | POA: Diagnosis not present

## 2022-07-29 DIAGNOSIS — M7918 Myalgia, other site: Secondary | ICD-10-CM | POA: Diagnosis not present

## 2022-07-30 DIAGNOSIS — N186 End stage renal disease: Secondary | ICD-10-CM | POA: Diagnosis not present

## 2022-07-30 DIAGNOSIS — N2581 Secondary hyperparathyroidism of renal origin: Secondary | ICD-10-CM | POA: Diagnosis not present

## 2022-07-30 DIAGNOSIS — Z992 Dependence on renal dialysis: Secondary | ICD-10-CM | POA: Diagnosis not present

## 2022-07-31 DIAGNOSIS — M7918 Myalgia, other site: Secondary | ICD-10-CM | POA: Diagnosis not present

## 2022-07-31 DIAGNOSIS — M9905 Segmental and somatic dysfunction of pelvic region: Secondary | ICD-10-CM | POA: Diagnosis not present

## 2022-07-31 DIAGNOSIS — M9903 Segmental and somatic dysfunction of lumbar region: Secondary | ICD-10-CM | POA: Diagnosis not present

## 2022-07-31 DIAGNOSIS — M9904 Segmental and somatic dysfunction of sacral region: Secondary | ICD-10-CM | POA: Diagnosis not present

## 2022-08-01 DIAGNOSIS — Z992 Dependence on renal dialysis: Secondary | ICD-10-CM | POA: Diagnosis not present

## 2022-08-01 DIAGNOSIS — N186 End stage renal disease: Secondary | ICD-10-CM | POA: Diagnosis not present

## 2022-08-01 DIAGNOSIS — N2581 Secondary hyperparathyroidism of renal origin: Secondary | ICD-10-CM | POA: Diagnosis not present

## 2022-08-04 DIAGNOSIS — N186 End stage renal disease: Secondary | ICD-10-CM | POA: Diagnosis not present

## 2022-08-04 DIAGNOSIS — Z992 Dependence on renal dialysis: Secondary | ICD-10-CM | POA: Diagnosis not present

## 2022-08-04 DIAGNOSIS — N2581 Secondary hyperparathyroidism of renal origin: Secondary | ICD-10-CM | POA: Diagnosis not present

## 2022-08-06 DIAGNOSIS — J069 Acute upper respiratory infection, unspecified: Secondary | ICD-10-CM | POA: Diagnosis not present

## 2022-08-06 DIAGNOSIS — Z6829 Body mass index (BMI) 29.0-29.9, adult: Secondary | ICD-10-CM | POA: Diagnosis not present

## 2022-08-06 DIAGNOSIS — R059 Cough, unspecified: Secondary | ICD-10-CM | POA: Diagnosis not present

## 2022-08-07 DIAGNOSIS — N186 End stage renal disease: Secondary | ICD-10-CM | POA: Diagnosis not present

## 2022-08-07 DIAGNOSIS — M9904 Segmental and somatic dysfunction of sacral region: Secondary | ICD-10-CM | POA: Diagnosis not present

## 2022-08-07 DIAGNOSIS — Z992 Dependence on renal dialysis: Secondary | ICD-10-CM | POA: Diagnosis not present

## 2022-08-07 DIAGNOSIS — M9905 Segmental and somatic dysfunction of pelvic region: Secondary | ICD-10-CM | POA: Diagnosis not present

## 2022-08-07 DIAGNOSIS — N2581 Secondary hyperparathyroidism of renal origin: Secondary | ICD-10-CM | POA: Diagnosis not present

## 2022-08-07 DIAGNOSIS — M7918 Myalgia, other site: Secondary | ICD-10-CM | POA: Diagnosis not present

## 2022-08-07 DIAGNOSIS — M9903 Segmental and somatic dysfunction of lumbar region: Secondary | ICD-10-CM | POA: Diagnosis not present

## 2022-08-08 DIAGNOSIS — Z992 Dependence on renal dialysis: Secondary | ICD-10-CM | POA: Diagnosis not present

## 2022-08-08 DIAGNOSIS — N186 End stage renal disease: Secondary | ICD-10-CM | POA: Diagnosis not present

## 2022-08-08 DIAGNOSIS — N2581 Secondary hyperparathyroidism of renal origin: Secondary | ICD-10-CM | POA: Diagnosis not present

## 2022-08-11 DIAGNOSIS — Z992 Dependence on renal dialysis: Secondary | ICD-10-CM | POA: Diagnosis not present

## 2022-08-11 DIAGNOSIS — N2581 Secondary hyperparathyroidism of renal origin: Secondary | ICD-10-CM | POA: Diagnosis not present

## 2022-08-11 DIAGNOSIS — N186 End stage renal disease: Secondary | ICD-10-CM | POA: Diagnosis not present

## 2022-08-13 DIAGNOSIS — N186 End stage renal disease: Secondary | ICD-10-CM | POA: Diagnosis not present

## 2022-08-13 DIAGNOSIS — Z992 Dependence on renal dialysis: Secondary | ICD-10-CM | POA: Diagnosis not present

## 2022-08-13 DIAGNOSIS — N2581 Secondary hyperparathyroidism of renal origin: Secondary | ICD-10-CM | POA: Diagnosis not present

## 2022-08-14 DIAGNOSIS — M7918 Myalgia, other site: Secondary | ICD-10-CM | POA: Diagnosis not present

## 2022-08-14 DIAGNOSIS — M9904 Segmental and somatic dysfunction of sacral region: Secondary | ICD-10-CM | POA: Diagnosis not present

## 2022-08-14 DIAGNOSIS — M9905 Segmental and somatic dysfunction of pelvic region: Secondary | ICD-10-CM | POA: Diagnosis not present

## 2022-08-14 DIAGNOSIS — M9903 Segmental and somatic dysfunction of lumbar region: Secondary | ICD-10-CM | POA: Diagnosis not present

## 2022-08-15 DIAGNOSIS — Z992 Dependence on renal dialysis: Secondary | ICD-10-CM | POA: Diagnosis not present

## 2022-08-15 DIAGNOSIS — N186 End stage renal disease: Secondary | ICD-10-CM | POA: Diagnosis not present

## 2022-08-15 DIAGNOSIS — N2581 Secondary hyperparathyroidism of renal origin: Secondary | ICD-10-CM | POA: Diagnosis not present

## 2022-08-18 DIAGNOSIS — N2581 Secondary hyperparathyroidism of renal origin: Secondary | ICD-10-CM | POA: Diagnosis not present

## 2022-08-18 DIAGNOSIS — R197 Diarrhea, unspecified: Secondary | ICD-10-CM | POA: Diagnosis not present

## 2022-08-18 DIAGNOSIS — Z992 Dependence on renal dialysis: Secondary | ICD-10-CM | POA: Diagnosis not present

## 2022-08-18 DIAGNOSIS — N186 End stage renal disease: Secondary | ICD-10-CM | POA: Diagnosis not present

## 2022-08-22 DIAGNOSIS — R197 Diarrhea, unspecified: Secondary | ICD-10-CM | POA: Diagnosis not present

## 2022-08-22 DIAGNOSIS — Z992 Dependence on renal dialysis: Secondary | ICD-10-CM | POA: Diagnosis not present

## 2022-08-22 DIAGNOSIS — N186 End stage renal disease: Secondary | ICD-10-CM | POA: Diagnosis not present

## 2022-08-22 DIAGNOSIS — N2581 Secondary hyperparathyroidism of renal origin: Secondary | ICD-10-CM | POA: Diagnosis not present

## 2022-08-25 DIAGNOSIS — N186 End stage renal disease: Secondary | ICD-10-CM | POA: Diagnosis not present

## 2022-08-25 DIAGNOSIS — N2581 Secondary hyperparathyroidism of renal origin: Secondary | ICD-10-CM | POA: Diagnosis not present

## 2022-08-25 DIAGNOSIS — Z992 Dependence on renal dialysis: Secondary | ICD-10-CM | POA: Diagnosis not present

## 2022-08-26 DIAGNOSIS — N186 End stage renal disease: Secondary | ICD-10-CM | POA: Diagnosis not present

## 2022-08-26 DIAGNOSIS — M7918 Myalgia, other site: Secondary | ICD-10-CM | POA: Diagnosis not present

## 2022-08-26 DIAGNOSIS — M9905 Segmental and somatic dysfunction of pelvic region: Secondary | ICD-10-CM | POA: Diagnosis not present

## 2022-08-26 DIAGNOSIS — Z992 Dependence on renal dialysis: Secondary | ICD-10-CM | POA: Diagnosis not present

## 2022-08-26 DIAGNOSIS — M9904 Segmental and somatic dysfunction of sacral region: Secondary | ICD-10-CM | POA: Diagnosis not present

## 2022-08-26 DIAGNOSIS — M9903 Segmental and somatic dysfunction of lumbar region: Secondary | ICD-10-CM | POA: Diagnosis not present

## 2022-08-26 DIAGNOSIS — I129 Hypertensive chronic kidney disease with stage 1 through stage 4 chronic kidney disease, or unspecified chronic kidney disease: Secondary | ICD-10-CM | POA: Diagnosis not present

## 2022-08-27 DIAGNOSIS — N186 End stage renal disease: Secondary | ICD-10-CM | POA: Diagnosis not present

## 2022-08-27 DIAGNOSIS — Z992 Dependence on renal dialysis: Secondary | ICD-10-CM | POA: Diagnosis not present

## 2022-08-27 DIAGNOSIS — N2581 Secondary hyperparathyroidism of renal origin: Secondary | ICD-10-CM | POA: Diagnosis not present

## 2022-08-29 DIAGNOSIS — N186 End stage renal disease: Secondary | ICD-10-CM | POA: Diagnosis not present

## 2022-08-29 DIAGNOSIS — N2581 Secondary hyperparathyroidism of renal origin: Secondary | ICD-10-CM | POA: Diagnosis not present

## 2022-08-29 DIAGNOSIS — Z992 Dependence on renal dialysis: Secondary | ICD-10-CM | POA: Diagnosis not present

## 2022-08-30 DIAGNOSIS — N390 Urinary tract infection, site not specified: Secondary | ICD-10-CM | POA: Diagnosis not present

## 2022-08-30 DIAGNOSIS — R300 Dysuria: Secondary | ICD-10-CM | POA: Diagnosis not present

## 2022-09-01 DIAGNOSIS — N2581 Secondary hyperparathyroidism of renal origin: Secondary | ICD-10-CM | POA: Diagnosis not present

## 2022-09-01 DIAGNOSIS — Z992 Dependence on renal dialysis: Secondary | ICD-10-CM | POA: Diagnosis not present

## 2022-09-01 DIAGNOSIS — N186 End stage renal disease: Secondary | ICD-10-CM | POA: Diagnosis not present

## 2022-09-01 DIAGNOSIS — R300 Dysuria: Secondary | ICD-10-CM | POA: Diagnosis not present

## 2022-09-02 DIAGNOSIS — M7918 Myalgia, other site: Secondary | ICD-10-CM | POA: Diagnosis not present

## 2022-09-02 DIAGNOSIS — M9904 Segmental and somatic dysfunction of sacral region: Secondary | ICD-10-CM | POA: Diagnosis not present

## 2022-09-02 DIAGNOSIS — M9905 Segmental and somatic dysfunction of pelvic region: Secondary | ICD-10-CM | POA: Diagnosis not present

## 2022-09-02 DIAGNOSIS — M9903 Segmental and somatic dysfunction of lumbar region: Secondary | ICD-10-CM | POA: Diagnosis not present

## 2022-09-03 DIAGNOSIS — N2581 Secondary hyperparathyroidism of renal origin: Secondary | ICD-10-CM | POA: Diagnosis not present

## 2022-09-03 DIAGNOSIS — Z992 Dependence on renal dialysis: Secondary | ICD-10-CM | POA: Diagnosis not present

## 2022-09-03 DIAGNOSIS — N186 End stage renal disease: Secondary | ICD-10-CM | POA: Diagnosis not present

## 2022-09-05 DIAGNOSIS — N186 End stage renal disease: Secondary | ICD-10-CM | POA: Diagnosis not present

## 2022-09-05 DIAGNOSIS — N2581 Secondary hyperparathyroidism of renal origin: Secondary | ICD-10-CM | POA: Diagnosis not present

## 2022-09-05 DIAGNOSIS — Z992 Dependence on renal dialysis: Secondary | ICD-10-CM | POA: Diagnosis not present

## 2022-09-09 DIAGNOSIS — N186 End stage renal disease: Secondary | ICD-10-CM | POA: Diagnosis not present

## 2022-09-09 DIAGNOSIS — N2581 Secondary hyperparathyroidism of renal origin: Secondary | ICD-10-CM | POA: Diagnosis not present

## 2022-09-09 DIAGNOSIS — Z992 Dependence on renal dialysis: Secondary | ICD-10-CM | POA: Diagnosis not present

## 2022-09-10 DIAGNOSIS — N2581 Secondary hyperparathyroidism of renal origin: Secondary | ICD-10-CM | POA: Diagnosis not present

## 2022-09-10 DIAGNOSIS — Z992 Dependence on renal dialysis: Secondary | ICD-10-CM | POA: Diagnosis not present

## 2022-09-10 DIAGNOSIS — N186 End stage renal disease: Secondary | ICD-10-CM | POA: Diagnosis not present

## 2022-09-12 DIAGNOSIS — N2581 Secondary hyperparathyroidism of renal origin: Secondary | ICD-10-CM | POA: Diagnosis not present

## 2022-09-12 DIAGNOSIS — N186 End stage renal disease: Secondary | ICD-10-CM | POA: Diagnosis not present

## 2022-09-12 DIAGNOSIS — Z992 Dependence on renal dialysis: Secondary | ICD-10-CM | POA: Diagnosis not present

## 2022-09-15 DIAGNOSIS — Z992 Dependence on renal dialysis: Secondary | ICD-10-CM | POA: Diagnosis not present

## 2022-09-15 DIAGNOSIS — N2581 Secondary hyperparathyroidism of renal origin: Secondary | ICD-10-CM | POA: Diagnosis not present

## 2022-09-15 DIAGNOSIS — N186 End stage renal disease: Secondary | ICD-10-CM | POA: Diagnosis not present

## 2022-09-16 DIAGNOSIS — M9905 Segmental and somatic dysfunction of pelvic region: Secondary | ICD-10-CM | POA: Diagnosis not present

## 2022-09-16 DIAGNOSIS — M7918 Myalgia, other site: Secondary | ICD-10-CM | POA: Diagnosis not present

## 2022-09-16 DIAGNOSIS — M9903 Segmental and somatic dysfunction of lumbar region: Secondary | ICD-10-CM | POA: Diagnosis not present

## 2022-09-16 DIAGNOSIS — M9904 Segmental and somatic dysfunction of sacral region: Secondary | ICD-10-CM | POA: Diagnosis not present

## 2022-09-17 DIAGNOSIS — N186 End stage renal disease: Secondary | ICD-10-CM | POA: Diagnosis not present

## 2022-09-17 DIAGNOSIS — Z992 Dependence on renal dialysis: Secondary | ICD-10-CM | POA: Diagnosis not present

## 2022-09-17 DIAGNOSIS — N2581 Secondary hyperparathyroidism of renal origin: Secondary | ICD-10-CM | POA: Diagnosis not present

## 2022-09-19 DIAGNOSIS — N2581 Secondary hyperparathyroidism of renal origin: Secondary | ICD-10-CM | POA: Diagnosis not present

## 2022-09-19 DIAGNOSIS — N186 End stage renal disease: Secondary | ICD-10-CM | POA: Diagnosis not present

## 2022-09-19 DIAGNOSIS — Z992 Dependence on renal dialysis: Secondary | ICD-10-CM | POA: Diagnosis not present

## 2022-09-22 DIAGNOSIS — R197 Diarrhea, unspecified: Secondary | ICD-10-CM | POA: Diagnosis not present

## 2022-09-22 DIAGNOSIS — N186 End stage renal disease: Secondary | ICD-10-CM | POA: Diagnosis not present

## 2022-09-22 DIAGNOSIS — Z992 Dependence on renal dialysis: Secondary | ICD-10-CM | POA: Diagnosis not present

## 2022-09-22 DIAGNOSIS — N2581 Secondary hyperparathyroidism of renal origin: Secondary | ICD-10-CM | POA: Diagnosis not present

## 2022-09-23 DIAGNOSIS — M7918 Myalgia, other site: Secondary | ICD-10-CM | POA: Diagnosis not present

## 2022-09-23 DIAGNOSIS — M9904 Segmental and somatic dysfunction of sacral region: Secondary | ICD-10-CM | POA: Diagnosis not present

## 2022-09-23 DIAGNOSIS — M9905 Segmental and somatic dysfunction of pelvic region: Secondary | ICD-10-CM | POA: Diagnosis not present

## 2022-09-23 DIAGNOSIS — M9903 Segmental and somatic dysfunction of lumbar region: Secondary | ICD-10-CM | POA: Diagnosis not present

## 2022-09-24 DIAGNOSIS — N2581 Secondary hyperparathyroidism of renal origin: Secondary | ICD-10-CM | POA: Diagnosis not present

## 2022-09-24 DIAGNOSIS — N186 End stage renal disease: Secondary | ICD-10-CM | POA: Diagnosis not present

## 2022-09-24 DIAGNOSIS — Z992 Dependence on renal dialysis: Secondary | ICD-10-CM | POA: Diagnosis not present

## 2022-09-24 DIAGNOSIS — R197 Diarrhea, unspecified: Secondary | ICD-10-CM | POA: Diagnosis not present

## 2022-09-26 DIAGNOSIS — Z992 Dependence on renal dialysis: Secondary | ICD-10-CM | POA: Diagnosis not present

## 2022-09-26 DIAGNOSIS — N186 End stage renal disease: Secondary | ICD-10-CM | POA: Diagnosis not present

## 2022-09-26 DIAGNOSIS — N2581 Secondary hyperparathyroidism of renal origin: Secondary | ICD-10-CM | POA: Diagnosis not present

## 2022-09-26 DIAGNOSIS — R197 Diarrhea, unspecified: Secondary | ICD-10-CM | POA: Diagnosis not present

## 2022-09-26 DIAGNOSIS — I129 Hypertensive chronic kidney disease with stage 1 through stage 4 chronic kidney disease, or unspecified chronic kidney disease: Secondary | ICD-10-CM | POA: Diagnosis not present

## 2022-09-29 DIAGNOSIS — N186 End stage renal disease: Secondary | ICD-10-CM | POA: Diagnosis not present

## 2022-09-29 DIAGNOSIS — N2581 Secondary hyperparathyroidism of renal origin: Secondary | ICD-10-CM | POA: Diagnosis not present

## 2022-09-29 DIAGNOSIS — Z992 Dependence on renal dialysis: Secondary | ICD-10-CM | POA: Diagnosis not present

## 2022-10-01 DIAGNOSIS — Z992 Dependence on renal dialysis: Secondary | ICD-10-CM | POA: Diagnosis not present

## 2022-10-01 DIAGNOSIS — N2581 Secondary hyperparathyroidism of renal origin: Secondary | ICD-10-CM | POA: Diagnosis not present

## 2022-10-01 DIAGNOSIS — N186 End stage renal disease: Secondary | ICD-10-CM | POA: Diagnosis not present

## 2022-10-03 DIAGNOSIS — Z992 Dependence on renal dialysis: Secondary | ICD-10-CM | POA: Diagnosis not present

## 2022-10-03 DIAGNOSIS — N186 End stage renal disease: Secondary | ICD-10-CM | POA: Diagnosis not present

## 2022-10-03 DIAGNOSIS — N2581 Secondary hyperparathyroidism of renal origin: Secondary | ICD-10-CM | POA: Diagnosis not present

## 2022-10-06 DIAGNOSIS — Z992 Dependence on renal dialysis: Secondary | ICD-10-CM | POA: Diagnosis not present

## 2022-10-06 DIAGNOSIS — R52 Pain, unspecified: Secondary | ICD-10-CM | POA: Diagnosis not present

## 2022-10-06 DIAGNOSIS — R197 Diarrhea, unspecified: Secondary | ICD-10-CM | POA: Diagnosis not present

## 2022-10-06 DIAGNOSIS — N2581 Secondary hyperparathyroidism of renal origin: Secondary | ICD-10-CM | POA: Diagnosis not present

## 2022-10-06 DIAGNOSIS — N186 End stage renal disease: Secondary | ICD-10-CM | POA: Diagnosis not present

## 2022-10-07 DIAGNOSIS — M25571 Pain in right ankle and joints of right foot: Secondary | ICD-10-CM | POA: Diagnosis not present

## 2022-10-07 DIAGNOSIS — M25561 Pain in right knee: Secondary | ICD-10-CM | POA: Diagnosis not present

## 2022-10-07 DIAGNOSIS — M7918 Myalgia, other site: Secondary | ICD-10-CM | POA: Diagnosis not present

## 2022-10-07 DIAGNOSIS — R600 Localized edema: Secondary | ICD-10-CM | POA: Diagnosis not present

## 2022-10-07 DIAGNOSIS — M9904 Segmental and somatic dysfunction of sacral region: Secondary | ICD-10-CM | POA: Diagnosis not present

## 2022-10-07 DIAGNOSIS — M25551 Pain in right hip: Secondary | ICD-10-CM | POA: Diagnosis not present

## 2022-10-07 DIAGNOSIS — M25461 Effusion, right knee: Secondary | ICD-10-CM | POA: Diagnosis not present

## 2022-10-07 DIAGNOSIS — Z6828 Body mass index (BMI) 28.0-28.9, adult: Secondary | ICD-10-CM | POA: Diagnosis not present

## 2022-10-07 DIAGNOSIS — M9903 Segmental and somatic dysfunction of lumbar region: Secondary | ICD-10-CM | POA: Diagnosis not present

## 2022-10-07 DIAGNOSIS — M9905 Segmental and somatic dysfunction of pelvic region: Secondary | ICD-10-CM | POA: Diagnosis not present

## 2022-10-08 DIAGNOSIS — R52 Pain, unspecified: Secondary | ICD-10-CM | POA: Diagnosis not present

## 2022-10-08 DIAGNOSIS — N2581 Secondary hyperparathyroidism of renal origin: Secondary | ICD-10-CM | POA: Diagnosis not present

## 2022-10-08 DIAGNOSIS — Z992 Dependence on renal dialysis: Secondary | ICD-10-CM | POA: Diagnosis not present

## 2022-10-08 DIAGNOSIS — N186 End stage renal disease: Secondary | ICD-10-CM | POA: Diagnosis not present

## 2022-10-08 DIAGNOSIS — R197 Diarrhea, unspecified: Secondary | ICD-10-CM | POA: Diagnosis not present

## 2022-10-10 DIAGNOSIS — R197 Diarrhea, unspecified: Secondary | ICD-10-CM | POA: Diagnosis not present

## 2022-10-10 DIAGNOSIS — N2581 Secondary hyperparathyroidism of renal origin: Secondary | ICD-10-CM | POA: Diagnosis not present

## 2022-10-10 DIAGNOSIS — N186 End stage renal disease: Secondary | ICD-10-CM | POA: Diagnosis not present

## 2022-10-10 DIAGNOSIS — Z992 Dependence on renal dialysis: Secondary | ICD-10-CM | POA: Diagnosis not present

## 2022-10-10 DIAGNOSIS — R52 Pain, unspecified: Secondary | ICD-10-CM | POA: Diagnosis not present

## 2022-10-13 DIAGNOSIS — Z992 Dependence on renal dialysis: Secondary | ICD-10-CM | POA: Diagnosis not present

## 2022-10-13 DIAGNOSIS — N2581 Secondary hyperparathyroidism of renal origin: Secondary | ICD-10-CM | POA: Diagnosis not present

## 2022-10-13 DIAGNOSIS — N186 End stage renal disease: Secondary | ICD-10-CM | POA: Diagnosis not present

## 2022-10-14 DIAGNOSIS — M9905 Segmental and somatic dysfunction of pelvic region: Secondary | ICD-10-CM | POA: Diagnosis not present

## 2022-10-14 DIAGNOSIS — M7918 Myalgia, other site: Secondary | ICD-10-CM | POA: Diagnosis not present

## 2022-10-14 DIAGNOSIS — M9903 Segmental and somatic dysfunction of lumbar region: Secondary | ICD-10-CM | POA: Diagnosis not present

## 2022-10-14 DIAGNOSIS — M9904 Segmental and somatic dysfunction of sacral region: Secondary | ICD-10-CM | POA: Diagnosis not present

## 2022-10-15 DIAGNOSIS — N186 End stage renal disease: Secondary | ICD-10-CM | POA: Diagnosis not present

## 2022-10-15 DIAGNOSIS — N2581 Secondary hyperparathyroidism of renal origin: Secondary | ICD-10-CM | POA: Diagnosis not present

## 2022-10-15 DIAGNOSIS — Z992 Dependence on renal dialysis: Secondary | ICD-10-CM | POA: Diagnosis not present

## 2022-10-17 DIAGNOSIS — Z992 Dependence on renal dialysis: Secondary | ICD-10-CM | POA: Diagnosis not present

## 2022-10-17 DIAGNOSIS — N2581 Secondary hyperparathyroidism of renal origin: Secondary | ICD-10-CM | POA: Diagnosis not present

## 2022-10-17 DIAGNOSIS — N186 End stage renal disease: Secondary | ICD-10-CM | POA: Diagnosis not present

## 2022-10-20 DIAGNOSIS — N186 End stage renal disease: Secondary | ICD-10-CM | POA: Diagnosis not present

## 2022-10-20 DIAGNOSIS — N2581 Secondary hyperparathyroidism of renal origin: Secondary | ICD-10-CM | POA: Diagnosis not present

## 2022-10-20 DIAGNOSIS — Z992 Dependence on renal dialysis: Secondary | ICD-10-CM | POA: Diagnosis not present

## 2022-10-21 DIAGNOSIS — M7918 Myalgia, other site: Secondary | ICD-10-CM | POA: Diagnosis not present

## 2022-10-21 DIAGNOSIS — M9903 Segmental and somatic dysfunction of lumbar region: Secondary | ICD-10-CM | POA: Diagnosis not present

## 2022-10-21 DIAGNOSIS — M9905 Segmental and somatic dysfunction of pelvic region: Secondary | ICD-10-CM | POA: Diagnosis not present

## 2022-10-21 DIAGNOSIS — M9904 Segmental and somatic dysfunction of sacral region: Secondary | ICD-10-CM | POA: Diagnosis not present

## 2022-10-22 DIAGNOSIS — N2581 Secondary hyperparathyroidism of renal origin: Secondary | ICD-10-CM | POA: Diagnosis not present

## 2022-10-22 DIAGNOSIS — N186 End stage renal disease: Secondary | ICD-10-CM | POA: Diagnosis not present

## 2022-10-22 DIAGNOSIS — Z992 Dependence on renal dialysis: Secondary | ICD-10-CM | POA: Diagnosis not present

## 2022-10-24 DIAGNOSIS — Z992 Dependence on renal dialysis: Secondary | ICD-10-CM | POA: Diagnosis not present

## 2022-10-24 DIAGNOSIS — N2581 Secondary hyperparathyroidism of renal origin: Secondary | ICD-10-CM | POA: Diagnosis not present

## 2022-10-24 DIAGNOSIS — N186 End stage renal disease: Secondary | ICD-10-CM | POA: Diagnosis not present

## 2022-10-26 DIAGNOSIS — I129 Hypertensive chronic kidney disease with stage 1 through stage 4 chronic kidney disease, or unspecified chronic kidney disease: Secondary | ICD-10-CM | POA: Diagnosis not present

## 2022-10-26 DIAGNOSIS — Z992 Dependence on renal dialysis: Secondary | ICD-10-CM | POA: Diagnosis not present

## 2022-10-26 DIAGNOSIS — N186 End stage renal disease: Secondary | ICD-10-CM | POA: Diagnosis not present

## 2022-10-27 DIAGNOSIS — N186 End stage renal disease: Secondary | ICD-10-CM | POA: Diagnosis not present

## 2022-10-27 DIAGNOSIS — Z992 Dependence on renal dialysis: Secondary | ICD-10-CM | POA: Diagnosis not present

## 2022-10-27 DIAGNOSIS — N2581 Secondary hyperparathyroidism of renal origin: Secondary | ICD-10-CM | POA: Diagnosis not present

## 2022-10-28 DIAGNOSIS — M9903 Segmental and somatic dysfunction of lumbar region: Secondary | ICD-10-CM | POA: Diagnosis not present

## 2022-10-28 DIAGNOSIS — M7918 Myalgia, other site: Secondary | ICD-10-CM | POA: Diagnosis not present

## 2022-10-28 DIAGNOSIS — M9905 Segmental and somatic dysfunction of pelvic region: Secondary | ICD-10-CM | POA: Diagnosis not present

## 2022-10-28 DIAGNOSIS — M9904 Segmental and somatic dysfunction of sacral region: Secondary | ICD-10-CM | POA: Diagnosis not present

## 2022-10-29 DIAGNOSIS — Z992 Dependence on renal dialysis: Secondary | ICD-10-CM | POA: Diagnosis not present

## 2022-10-29 DIAGNOSIS — N2581 Secondary hyperparathyroidism of renal origin: Secondary | ICD-10-CM | POA: Diagnosis not present

## 2022-10-29 DIAGNOSIS — N186 End stage renal disease: Secondary | ICD-10-CM | POA: Diagnosis not present

## 2022-10-31 DIAGNOSIS — Z992 Dependence on renal dialysis: Secondary | ICD-10-CM | POA: Diagnosis not present

## 2022-10-31 DIAGNOSIS — N2581 Secondary hyperparathyroidism of renal origin: Secondary | ICD-10-CM | POA: Diagnosis not present

## 2022-10-31 DIAGNOSIS — N186 End stage renal disease: Secondary | ICD-10-CM | POA: Diagnosis not present

## 2022-11-03 ENCOUNTER — Other Ambulatory Visit: Payer: Self-pay | Admitting: Surgery

## 2022-11-03 DIAGNOSIS — K432 Incisional hernia without obstruction or gangrene: Secondary | ICD-10-CM | POA: Diagnosis not present

## 2022-11-05 DIAGNOSIS — R197 Diarrhea, unspecified: Secondary | ICD-10-CM | POA: Diagnosis not present

## 2022-11-05 DIAGNOSIS — Z992 Dependence on renal dialysis: Secondary | ICD-10-CM | POA: Diagnosis not present

## 2022-11-05 DIAGNOSIS — N2581 Secondary hyperparathyroidism of renal origin: Secondary | ICD-10-CM | POA: Diagnosis not present

## 2022-11-05 DIAGNOSIS — N186 End stage renal disease: Secondary | ICD-10-CM | POA: Diagnosis not present

## 2022-11-07 DIAGNOSIS — Z992 Dependence on renal dialysis: Secondary | ICD-10-CM | POA: Diagnosis not present

## 2022-11-07 DIAGNOSIS — N186 End stage renal disease: Secondary | ICD-10-CM | POA: Diagnosis not present

## 2022-11-07 DIAGNOSIS — R197 Diarrhea, unspecified: Secondary | ICD-10-CM | POA: Diagnosis not present

## 2022-11-07 DIAGNOSIS — N2581 Secondary hyperparathyroidism of renal origin: Secondary | ICD-10-CM | POA: Diagnosis not present

## 2022-11-10 DIAGNOSIS — N186 End stage renal disease: Secondary | ICD-10-CM | POA: Diagnosis not present

## 2022-11-10 DIAGNOSIS — N2581 Secondary hyperparathyroidism of renal origin: Secondary | ICD-10-CM | POA: Diagnosis not present

## 2022-11-10 DIAGNOSIS — Z992 Dependence on renal dialysis: Secondary | ICD-10-CM | POA: Diagnosis not present

## 2022-11-10 DIAGNOSIS — R197 Diarrhea, unspecified: Secondary | ICD-10-CM | POA: Diagnosis not present

## 2022-11-12 DIAGNOSIS — N2581 Secondary hyperparathyroidism of renal origin: Secondary | ICD-10-CM | POA: Diagnosis not present

## 2022-11-12 DIAGNOSIS — R197 Diarrhea, unspecified: Secondary | ICD-10-CM | POA: Diagnosis not present

## 2022-11-12 DIAGNOSIS — N186 End stage renal disease: Secondary | ICD-10-CM | POA: Diagnosis not present

## 2022-11-12 DIAGNOSIS — Z992 Dependence on renal dialysis: Secondary | ICD-10-CM | POA: Diagnosis not present

## 2022-11-14 DIAGNOSIS — N2581 Secondary hyperparathyroidism of renal origin: Secondary | ICD-10-CM | POA: Diagnosis not present

## 2022-11-14 DIAGNOSIS — Z992 Dependence on renal dialysis: Secondary | ICD-10-CM | POA: Diagnosis not present

## 2022-11-14 DIAGNOSIS — R197 Diarrhea, unspecified: Secondary | ICD-10-CM | POA: Diagnosis not present

## 2022-11-14 DIAGNOSIS — N186 End stage renal disease: Secondary | ICD-10-CM | POA: Diagnosis not present

## 2022-11-17 DIAGNOSIS — N186 End stage renal disease: Secondary | ICD-10-CM | POA: Diagnosis not present

## 2022-11-17 DIAGNOSIS — N2581 Secondary hyperparathyroidism of renal origin: Secondary | ICD-10-CM | POA: Diagnosis not present

## 2022-11-17 DIAGNOSIS — Z992 Dependence on renal dialysis: Secondary | ICD-10-CM | POA: Diagnosis not present

## 2022-11-19 DIAGNOSIS — N186 End stage renal disease: Secondary | ICD-10-CM | POA: Diagnosis not present

## 2022-11-19 DIAGNOSIS — N2581 Secondary hyperparathyroidism of renal origin: Secondary | ICD-10-CM | POA: Diagnosis not present

## 2022-11-19 DIAGNOSIS — Z992 Dependence on renal dialysis: Secondary | ICD-10-CM | POA: Diagnosis not present

## 2022-11-21 DIAGNOSIS — N2581 Secondary hyperparathyroidism of renal origin: Secondary | ICD-10-CM | POA: Diagnosis not present

## 2022-11-21 DIAGNOSIS — N186 End stage renal disease: Secondary | ICD-10-CM | POA: Diagnosis not present

## 2022-11-21 DIAGNOSIS — Z992 Dependence on renal dialysis: Secondary | ICD-10-CM | POA: Diagnosis not present

## 2022-11-24 DIAGNOSIS — Z992 Dependence on renal dialysis: Secondary | ICD-10-CM | POA: Diagnosis not present

## 2022-11-24 DIAGNOSIS — N186 End stage renal disease: Secondary | ICD-10-CM | POA: Diagnosis not present

## 2022-11-24 DIAGNOSIS — N2581 Secondary hyperparathyroidism of renal origin: Secondary | ICD-10-CM | POA: Diagnosis not present

## 2022-11-26 DIAGNOSIS — N2581 Secondary hyperparathyroidism of renal origin: Secondary | ICD-10-CM | POA: Diagnosis not present

## 2022-11-26 DIAGNOSIS — I129 Hypertensive chronic kidney disease with stage 1 through stage 4 chronic kidney disease, or unspecified chronic kidney disease: Secondary | ICD-10-CM | POA: Diagnosis not present

## 2022-11-26 DIAGNOSIS — N186 End stage renal disease: Secondary | ICD-10-CM | POA: Diagnosis not present

## 2022-11-26 DIAGNOSIS — Z992 Dependence on renal dialysis: Secondary | ICD-10-CM | POA: Diagnosis not present

## 2022-11-28 ENCOUNTER — Telehealth: Payer: Self-pay

## 2022-11-28 DIAGNOSIS — K432 Incisional hernia without obstruction or gangrene: Secondary | ICD-10-CM | POA: Diagnosis not present

## 2022-11-28 NOTE — Telephone Encounter (Signed)
Pts last OV was 05/14/21 with Dr. Wyline Mood.     Pre-operative Risk Assessment    Patient Name: Alison Baker  DOB: 07/05/1955 MRN: [id number]      Request for Surgical Clearance    Procedure:   Hernia Repair Surgery  Date of Surgery:  Clearance TBD                                 Surgeon:  Axel Filler, MD Surgeon's Group or Practice Name:  [facility] Phone number:  [phone] Fax number:  [phone] Brennan Bailey, CMA   Type of Clearance Requested:   - Medical    Type of Anesthesia:  General    Additional requests/questions:    Signed, Zada Finders   11/28/2022, 10:20 AM

## 2022-11-28 NOTE — Telephone Encounter (Signed)
I called pt to schedule an in-office appt.I lvmtrc

## 2022-11-29 DIAGNOSIS — N186 End stage renal disease: Secondary | ICD-10-CM | POA: Diagnosis not present

## 2022-11-29 DIAGNOSIS — Z992 Dependence on renal dialysis: Secondary | ICD-10-CM | POA: Diagnosis not present

## 2022-11-29 DIAGNOSIS — N2581 Secondary hyperparathyroidism of renal origin: Secondary | ICD-10-CM | POA: Diagnosis not present

## 2022-12-01 DIAGNOSIS — N186 End stage renal disease: Secondary | ICD-10-CM | POA: Diagnosis not present

## 2022-12-01 DIAGNOSIS — Z992 Dependence on renal dialysis: Secondary | ICD-10-CM | POA: Diagnosis not present

## 2022-12-01 DIAGNOSIS — N2581 Secondary hyperparathyroidism of renal origin: Secondary | ICD-10-CM | POA: Diagnosis not present

## 2022-12-01 NOTE — Telephone Encounter (Signed)
Per pt she is having blood pressure issues and her surgeon advised her that she needed to be seen before being put to sleep for surgery.  Pt has been scheduled to see Dr. Wyline Mood, Thursday 12/04/22 9:30.  Will route back to the requesting surgeon's office to make them aware.

## 2022-12-02 ENCOUNTER — Other Ambulatory Visit: Payer: Self-pay | Admitting: General Surgery

## 2022-12-02 DIAGNOSIS — N959 Unspecified menopausal and perimenopausal disorder: Secondary | ICD-10-CM | POA: Diagnosis not present

## 2022-12-02 DIAGNOSIS — K432 Incisional hernia without obstruction or gangrene: Secondary | ICD-10-CM

## 2022-12-02 DIAGNOSIS — N185 Chronic kidney disease, stage 5: Secondary | ICD-10-CM | POA: Diagnosis not present

## 2022-12-02 DIAGNOSIS — D631 Anemia in chronic kidney disease: Secondary | ICD-10-CM | POA: Diagnosis not present

## 2022-12-02 DIAGNOSIS — Z992 Dependence on renal dialysis: Secondary | ICD-10-CM | POA: Diagnosis not present

## 2022-12-03 DIAGNOSIS — N186 End stage renal disease: Secondary | ICD-10-CM | POA: Diagnosis not present

## 2022-12-03 DIAGNOSIS — N2581 Secondary hyperparathyroidism of renal origin: Secondary | ICD-10-CM | POA: Diagnosis not present

## 2022-12-03 DIAGNOSIS — Z992 Dependence on renal dialysis: Secondary | ICD-10-CM | POA: Diagnosis not present

## 2022-12-04 ENCOUNTER — Ambulatory Visit: Payer: BC Managed Care – PPO | Admitting: Internal Medicine

## 2022-12-05 DIAGNOSIS — N2581 Secondary hyperparathyroidism of renal origin: Secondary | ICD-10-CM | POA: Diagnosis not present

## 2022-12-05 DIAGNOSIS — N186 End stage renal disease: Secondary | ICD-10-CM | POA: Diagnosis not present

## 2022-12-05 DIAGNOSIS — Z992 Dependence on renal dialysis: Secondary | ICD-10-CM | POA: Diagnosis not present

## 2022-12-08 DIAGNOSIS — Z992 Dependence on renal dialysis: Secondary | ICD-10-CM | POA: Diagnosis not present

## 2022-12-08 DIAGNOSIS — N2581 Secondary hyperparathyroidism of renal origin: Secondary | ICD-10-CM | POA: Diagnosis not present

## 2022-12-08 DIAGNOSIS — N186 End stage renal disease: Secondary | ICD-10-CM | POA: Diagnosis not present

## 2022-12-08 DIAGNOSIS — R197 Diarrhea, unspecified: Secondary | ICD-10-CM | POA: Diagnosis not present

## 2022-12-10 DIAGNOSIS — R197 Diarrhea, unspecified: Secondary | ICD-10-CM | POA: Diagnosis not present

## 2022-12-10 DIAGNOSIS — N2581 Secondary hyperparathyroidism of renal origin: Secondary | ICD-10-CM | POA: Diagnosis not present

## 2022-12-10 DIAGNOSIS — N186 End stage renal disease: Secondary | ICD-10-CM | POA: Diagnosis not present

## 2022-12-10 DIAGNOSIS — Z992 Dependence on renal dialysis: Secondary | ICD-10-CM | POA: Diagnosis not present

## 2022-12-12 DIAGNOSIS — R197 Diarrhea, unspecified: Secondary | ICD-10-CM | POA: Diagnosis not present

## 2022-12-12 DIAGNOSIS — Z992 Dependence on renal dialysis: Secondary | ICD-10-CM | POA: Diagnosis not present

## 2022-12-12 DIAGNOSIS — N2581 Secondary hyperparathyroidism of renal origin: Secondary | ICD-10-CM | POA: Diagnosis not present

## 2022-12-12 DIAGNOSIS — N186 End stage renal disease: Secondary | ICD-10-CM | POA: Diagnosis not present

## 2022-12-15 DIAGNOSIS — N186 End stage renal disease: Secondary | ICD-10-CM | POA: Diagnosis not present

## 2022-12-15 DIAGNOSIS — R197 Diarrhea, unspecified: Secondary | ICD-10-CM | POA: Diagnosis not present

## 2022-12-15 DIAGNOSIS — N2581 Secondary hyperparathyroidism of renal origin: Secondary | ICD-10-CM | POA: Diagnosis not present

## 2022-12-15 DIAGNOSIS — Z992 Dependence on renal dialysis: Secondary | ICD-10-CM | POA: Diagnosis not present

## 2022-12-16 ENCOUNTER — Other Ambulatory Visit: Payer: BC Managed Care – PPO

## 2022-12-17 DIAGNOSIS — N2581 Secondary hyperparathyroidism of renal origin: Secondary | ICD-10-CM | POA: Diagnosis not present

## 2022-12-17 DIAGNOSIS — R197 Diarrhea, unspecified: Secondary | ICD-10-CM | POA: Diagnosis not present

## 2022-12-17 DIAGNOSIS — Z992 Dependence on renal dialysis: Secondary | ICD-10-CM | POA: Diagnosis not present

## 2022-12-17 DIAGNOSIS — N186 End stage renal disease: Secondary | ICD-10-CM | POA: Diagnosis not present

## 2022-12-18 DIAGNOSIS — H4312 Vitreous hemorrhage, left eye: Secondary | ICD-10-CM | POA: Diagnosis not present

## 2022-12-19 DIAGNOSIS — N186 End stage renal disease: Secondary | ICD-10-CM | POA: Diagnosis not present

## 2022-12-19 DIAGNOSIS — R197 Diarrhea, unspecified: Secondary | ICD-10-CM | POA: Diagnosis not present

## 2022-12-19 DIAGNOSIS — Z992 Dependence on renal dialysis: Secondary | ICD-10-CM | POA: Diagnosis not present

## 2022-12-19 DIAGNOSIS — N2581 Secondary hyperparathyroidism of renal origin: Secondary | ICD-10-CM | POA: Diagnosis not present

## 2022-12-22 DIAGNOSIS — N186 End stage renal disease: Secondary | ICD-10-CM | POA: Diagnosis not present

## 2022-12-22 DIAGNOSIS — Z992 Dependence on renal dialysis: Secondary | ICD-10-CM | POA: Diagnosis not present

## 2022-12-22 DIAGNOSIS — N2581 Secondary hyperparathyroidism of renal origin: Secondary | ICD-10-CM | POA: Diagnosis not present

## 2022-12-23 DIAGNOSIS — M9903 Segmental and somatic dysfunction of lumbar region: Secondary | ICD-10-CM | POA: Diagnosis not present

## 2022-12-23 DIAGNOSIS — M9904 Segmental and somatic dysfunction of sacral region: Secondary | ICD-10-CM | POA: Diagnosis not present

## 2022-12-23 DIAGNOSIS — M7918 Myalgia, other site: Secondary | ICD-10-CM | POA: Diagnosis not present

## 2022-12-23 DIAGNOSIS — M9905 Segmental and somatic dysfunction of pelvic region: Secondary | ICD-10-CM | POA: Diagnosis not present

## 2022-12-24 DIAGNOSIS — H4312 Vitreous hemorrhage, left eye: Secondary | ICD-10-CM | POA: Diagnosis not present

## 2022-12-26 DIAGNOSIS — N2581 Secondary hyperparathyroidism of renal origin: Secondary | ICD-10-CM | POA: Diagnosis not present

## 2022-12-26 DIAGNOSIS — Z992 Dependence on renal dialysis: Secondary | ICD-10-CM | POA: Diagnosis not present

## 2022-12-26 DIAGNOSIS — N186 End stage renal disease: Secondary | ICD-10-CM | POA: Diagnosis not present

## 2022-12-27 DIAGNOSIS — N186 End stage renal disease: Secondary | ICD-10-CM | POA: Diagnosis not present

## 2022-12-27 DIAGNOSIS — Z992 Dependence on renal dialysis: Secondary | ICD-10-CM | POA: Diagnosis not present

## 2022-12-27 DIAGNOSIS — I129 Hypertensive chronic kidney disease with stage 1 through stage 4 chronic kidney disease, or unspecified chronic kidney disease: Secondary | ICD-10-CM | POA: Diagnosis not present

## 2022-12-28 NOTE — Progress Notes (Unsigned)
Cardiology Clinic Note   Patient Name: Alison Baker Date of Encounter: 12/30/2022  Primary Care Provider:  Marlyn Corporal, PA Primary Cardiologist:  Maisie Fus, MD  Patient Profile    67 year old female with history of end-stage renal disease with dialysis on Monday Wednesday Friday, stress echo in 2022 was nondiagnostic, she remained on high doses of labetalol with ongoing hypertension.  Echocardiogram with a EF of 55 to 60% with no diastolic dysfunction.    History of minimal CAD per cardiac CTA on 06/06/2021 with a calcium score of 146.  She was to be on risk factor modification.  She was not placed on statin.  She was to continue her labetalol and amlodipine.  Per Dr. Wyline Mood she was in acceptable cardiac risk for kidney transplant when seen last on 08/13/2021.  Past Medical History    Past Medical History:  Diagnosis Date   Anemia    Anxiety    Complication of anesthesia    slow to awaken, sentive to medications rarely even takes a Tylenol.   Diarrhea    End-stage kidney disease (HCC)    M/W/F-    Gastric ulcer with hemorrhage    GERD (gastroesophageal reflux disease)    Hyperlipidemia    Hypertension    Pneumonia    PONV (postoperative nausea and vomiting)    after hysterectomy- 1980's   Pre-diabetes    Umbilical hernia    Past Surgical History:  Procedure Laterality Date   APPENDECTOMY     BASCILIC VEIN TRANSPOSITION Left 07/15/2016   Procedure: BRACHIAL VEIN TRANSPOSITION FIRST STAGE;  Surgeon: Fransisco Hertz, MD;  Location: [facility] OR;  Service: Vascular;  Laterality: Left;   BASCILIC VEIN TRANSPOSITION Left 11/04/2016   Procedure: SECOND STAGE BRACHIAL VEIN TRANSPOSITION;  Surgeon: Fransisco Hertz, MD;  Location: [facility] OR;  Service: Vascular;  Laterality: Left;   CHOLECYSTECTOMY     partial hepatectomy   COLONOSCOPY W/ POLYPECTOMY     EYE SURGERY Right    catarct   EYE SURGERY Left 08/2016   cataract   INSERTION OF DIALYSIS CATHETER     IR GENERIC HISTORICAL   06/23/2016   IR US GUIDE VASC ACCESS RIGHT 06/23/2016 Oley Balm, MD [facility]   IR GENERIC HISTORICAL  06/23/2016   IR FLUORO GUIDE CV LINE RIGHT 06/23/2016 Oley Balm, MD [facility]   TOTAL ABDOMINAL HYSTERECTOMY W/ BILATERAL SALPINGOOPHORECTOMY     TRABECULECTOMY Bilateral     Allergies  Allergies  Allergen Reactions   Nsaids Other (See Comments)    Has a healing stomach ulcer   Penicillins Rash and Other (See Comments)    Rash and rib pain.  PATIENT HAD A PCN REACTION WITH IMMEDIATE RASH, FACIAL/TONGUE/THROAT SWELLING, SOB, OR LIGHTHEADEDNESS WITH HYPOTENSION:  #  #  #  YES  #  #  #   Has patient had a PCN reaction causing severe rash involving mucus membranes or skin necrosis: No Has patient had a PCN reaction that required hospitalization: No Has patient had a PCN reaction occurring within the last 10 years: No.    Strawberry Extract Swelling    SWELLING REACTION UNSPECIFIED    Clonidine Nausea And Vomiting   Sulfa Antibiotics Rash and Other (See Comments)    Rib pain    History of Present Illness    [name] comes to the office today for cardiac clearance in order to have hernia repair surgery by Dr. Axel Filler on date to be determined with [facility] surgery.  The patient had been having issues with hypertension and had been requested to be seen by cardiology prior to surgery.  Patient was due to see Dr. Wyline Mood on 12/05/2022 for evaluation.  She comes today was extremely elevated blood pressure.  Apparently her blood pressure is running very high since the death of her mother around Mother's Day this year.  Surrounding is elevated is 200 over 90s consistently.  Has been noticed during dialysis but no medication adjustments were made.  She states she takes labetalol 100 mg after returning from dialysis and 5 mg of amlodipine at night before bed.  She has only been taking labetalol 100 mg daily as opposed to 100 mg twice daily as directed.  She decreases  the dose of labetalol to 100 mg on dialysis today due to hypotension.  She comes today having not taken her labetalol this morning.  She complains of losing her eyesight when bending over.  Mostly in the left eye.  She also is being seen by ophthalmologist is going to have laser surgery to the left eye due to mild bleeding noted.  Home Medications    Current Outpatient Medications  Medication Sig Dispense Refill   acetaminophen (TYLENOL) 500 MG tablet Take 500 mg by mouth 3 (three) times daily as needed for headache.     amLODipine (NORVASC) 10 MG tablet Take 10 mg by mouth See admin instructions. Takes on non dialysis days, Sunday, Tuesday, Thursday and Saturday takes at bedtime     calcium carbonate (TUMS EX) 750 MG chewable tablet Chew 1 tablet by mouth daily.     folic acid-vitamin b complex-vitamin c-selenium-zinc (DIALYVITE) 3 MG TABS tablet Take 1 tablet by mouth daily.     labetalol (NORMODYNE) 100 MG tablet Take 1 tablet by mouth 2 (two) times daily.     lansoprazole (PREVACID) 30 MG capsule Take 1 capsule (30 mg total) by mouth daily at 12 noon. 30 capsule 3   meclizine (ANTIVERT) 25 MG tablet Take 25 mg by mouth 3 (three) times daily as needed for dizziness.     fluticasone (FLONASE) 50 MCG/ACT nasal spray Place into both nostrils as needed. (Patient not taking: Reported on 12/30/2022)     Hyoscyamine Sulfate SL (LEVSIN/SL) 0.125 MG SUBL Place 1 tablet under the tongue daily as needed. (Patient not taking: Reported on 12/30/2022) 30 tablet 3   No current facility-administered medications for this visit.     Family History    Family History  Problem Relation Age of Onset   Hypertension Mother    Cancer Sister        vulvular cancer -then mets to brain, lung   Colon cancer Neg Hx    Esophageal cancer Neg Hx    Stomach cancer Neg Hx    Pancreatic cancer Neg Hx    Liver disease Neg Hx    She indicated that her mother is alive. She indicated that her father is alive. She indicated  that the status of her sister is unknown. She indicated that the status of her neg hx is unknown.  Social History    Social History   Socioeconomic History   Marital status: Married    Spouse name: Not on file   Number of children: Not on file   Years of education: Not on file   Highest education level: Not on file  Occupational History   Not on file  Tobacco Use   Smoking status: Former    Current packs/day: 0.00  Types: Cigarettes    Quit date: 06/27/1995    Years since quitting: 27.5   Smokeless tobacco: Never  Vaping Use   Vaping status: Never Used  Substance and Sexual Activity   Alcohol use: No   Drug use: No   Sexual activity: Not on file  Other Topics Concern   Not on file  Social History Narrative   Not on file   Social Determinants of Health   Financial Resource Strain: Not on file  Food Insecurity: Not on file  Transportation Needs: Not on file  Physical Activity: Not on file  Stress: Not on file  Social Connections: Not on file  Intimate Partner Violence: Not on file     Review of Systems    General:  No chills, fever, night sweats or weight changes.  Cardiovascular:  No chest pain, dyspnea on exertion, edema, orthopnea, palpitations, paroxysmal nocturnal dyspnea. Dermatological: No rash, lesions/masses Respiratory: No cough, dyspnea Urologic: No hematuria, dysuria Abdominal:   No nausea, vomiting, diarrhea, bright red blood per rectum, melena, or hematemesis Neurologic:  No visual changes, wkns, changes in mental status. All other systems reviewed and are otherwise negative except as noted above.  EKG Interpretation Date/Time:  Tuesday December 30 2022 11:20:46 EDT Ventricular Rate:  69 PR Interval:  194 QRS Duration:  104 QT Interval:  436 QTC Calculation: 467 R Axis:   -18  Text Interpretation: Normal sinus rhythm Left ventricular hypertrophy with repolarization abnormality ( R in aVL , Sokolow-Lyon , Cornell product ) When compared with  ECG of 12-Sep-2016 04:52, PREVIOUS ECG IS PRESENT Confirmed by Joni Reining [phone]) on 12/30/2022 12:14:28 PM    Physical Exam    VS:  BP (!) 205/75 Comment: Taken on dinamap, NP made aware  Pulse 69   Ht 5' 3.5" (1.613 m)   Wt 157 lb 3.2 oz (71.3 kg)   SpO2 96%   BMI 27.41 kg/m  , BMI Body mass index is 27.41 kg/m.     GEN: Well nourished, well developed, in no acute distress. HEENT: normal. Neck: Supple, no JVD, bilateral carotid bruits, or masses. Cardiac: RRR, 2/6 systolic murmur radiation into the carotid arteries bilaterally rubs, or gallops. No clubbing, cyanosis, edema.  Radials/DP/PT 2+ and equal bilaterally.  Respiratory:  Respirations regular and unlabored, clear to auscultation bilaterally. GI: Soft, nontender, nondistended, BS + x 4. MS: no deformity or atrophy.  Dialysis shunt to the left brachial. Skin: warm and dry, no rash. Neuro:  Strength and sensation are intact. Psych: Normal affect.  EKG Interpretation Date/Time:  Tuesday December 30 2022 11:20:46 EDT Ventricular Rate:  69 PR Interval:  194 QRS Duration:  104 QT Interval:  436 QTC Calculation: 467 R Axis:   -18  Text Interpretation: Normal sinus rhythm Left ventricular hypertrophy with repolarization abnormality ( R in aVL , Sokolow-Lyon , Cornell product ) When compared with ECG of 12-Sep-2016 04:52, PREVIOUS ECG IS PRESENT Confirmed by Joni Reining [phone]) on 12/30/2022 12:14:28 PM   Lab Results  Component Value Date   WBC 13.2 (H) 09/13/2016   HGB 10.5 (L) 11/04/2016   HCT 31.0 (L) 11/04/2016   MCV 104.2 (H) 09/13/2016   PLT 195 09/13/2016   Lab Results  Component Value Date   CREATININE 6.40 (H) 05/28/2021   BUN 29 (H) 05/28/2021   NA 142 05/28/2021   K 4.6 05/28/2021   CL 97 05/28/2021   CO2 24 05/28/2021   Lab Results  Component Value Date   ALT 10 (L)  06/13/2016   AST 11 (L) 06/13/2016   ALKPHOS 97 06/13/2016   BILITOT 1.5 (H) 06/13/2016   No results found for: "CHOL",  "HDL", "LDLCALC", "LDLDIRECT", "TRIG", "CHOLHDL"  Lab Results  Component Value Date   HGBA1C 4.7 (L) 06/13/2016     Review of Prior Studies EKG Interpretation Date/Time:  Tuesday December 30 2022 11:20:46 EDT Ventricular Rate:  69 PR Interval:  194 QRS Duration:  104 QT Interval:  436 QTC Calculation: 467 R Axis:   -18  Text Interpretation: Normal sinus rhythm Left ventricular hypertrophy with repolarization abnormality ( R in aVL , Sokolow-Lyon , Cornell product ) When compared with ECG of 12-Sep-2016 04:52, PREVIOUS ECG IS PRESENT Confirmed by Joni Reining [phone]) on 12/30/2022 12:14:28 PM  Cardiac CTA 06/06/2021 IMPRESSION: 1. 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   2. Coronary calcium score of 146. This was 88th percentile for age and sex matched control.   3. Normal coronary origin with right dominance.   4. Dilated main pulmonary artery at 28 mm, suggestive of pulmonary hypertension.   5. Aortic atherosclerosis and aortic valve annular calcification.   6. Aggressive cardiovascular risk factor modification is recommended.   Stress echocardiogram dated 11/13/2020: (Scanned document) LV size was normal in size normal ventricular contractility overall LVEF of 55 to 60%.  Abnormal stress echo with reduced exercise tolerance with hypotensive blood pressure response inferior to decreased LV volume although hypokinesis was not seen.  Assessment & Plan   1.  Uncontrolled hypertension: Multifactorial in the setting of end-stage renal disease, situational stress and anxiety, and medication noncompliance per written prescription instructions.  I am going to start her back on labetalol 200 mg daily on nondialysis days.  On dialysis today she is to take the labetalol 100 mg on returning from dialysis.  She is to take amlodipine 5 mg at night as directed.  If blood pressure is elevated may need to keep her on higher dose of 10 mg daily.  She sometimes  stops the amlodipine if her blood pressure is too low on dialysis days.  On nondialysis days she is to take labetalol 100 mg twice daily along with amlodipine in the evening.  She is to take her blood pressure daily at the same time every day both feet on the floor after sitting for 5 minutes.  She is to record this and also write down which days of dialysis days.  May need to increase labetalol on nondialysis days for better blood pressure control.  She has not taken her blood pressure medication today and I have given her water to take the labetalol at this time.  The patient is not cleared to have abdominal hernia repair until blood pressure is much better controlled.  Will need to have unofficial cardiac clearance request from the surgeon before being able to clear her once her blood pressure is controlled.  Plan on ordering repeat echocardiogram on next office visit once blood pressure is better controlled.  2.  End-stage renal disease: Dialysis on Monday Wednesday Fridays.  Blood pressure is also managed and monitored during dialysis.  She states that blood pressure is usually elevated during dialysis will wait and see based upon her response based on blood pressure readings on dialysis days.  If unable to control blood pressure may refer her to Dr. Duke Salvia hypertension specialist with our practice.  3. Pre-Operative Clearance: Patient is not cleared to have hernia repair until blood pressure is well-controlled.  4.  Situational stress and anxiety: Would recommend that she follow-up with her PCP and be considered for antianxiety med's.         Signed, Bettey Mare. Liborio Nixon, ANP, AACC   12/30/2022 12:24 PM      Office [phone] Fax [phone]  Notice: This dictation was prepared with Dragon dictation along with smaller phrase technology. Any transcriptional errors that result from this process are unintentional and may not be corrected upon review.

## 2022-12-29 DIAGNOSIS — Z992 Dependence on renal dialysis: Secondary | ICD-10-CM | POA: Diagnosis not present

## 2022-12-29 DIAGNOSIS — N186 End stage renal disease: Secondary | ICD-10-CM | POA: Diagnosis not present

## 2022-12-29 DIAGNOSIS — R197 Diarrhea, unspecified: Secondary | ICD-10-CM | POA: Diagnosis not present

## 2022-12-29 DIAGNOSIS — N2581 Secondary hyperparathyroidism of renal origin: Secondary | ICD-10-CM | POA: Diagnosis not present

## 2022-12-30 ENCOUNTER — Encounter: Payer: Self-pay | Admitting: Adult Health

## 2022-12-30 ENCOUNTER — Ambulatory Visit: Admission: RE | Admit: 2022-12-30 | Payer: BC Managed Care – PPO | Source: Ambulatory Visit

## 2022-12-30 ENCOUNTER — Ambulatory Visit: Payer: BC Managed Care – PPO | Attending: Internal Medicine | Admitting: Adult Health

## 2022-12-30 VITALS — BP 205/75 | HR 69 | Ht 63.5 in | Wt 157.2 lb

## 2022-12-30 DIAGNOSIS — Z992 Dependence on renal dialysis: Secondary | ICD-10-CM | POA: Diagnosis not present

## 2022-12-30 DIAGNOSIS — M7918 Myalgia, other site: Secondary | ICD-10-CM | POA: Diagnosis not present

## 2022-12-30 DIAGNOSIS — N186 End stage renal disease: Secondary | ICD-10-CM

## 2022-12-30 DIAGNOSIS — M9904 Segmental and somatic dysfunction of sacral region: Secondary | ICD-10-CM | POA: Diagnosis not present

## 2022-12-30 DIAGNOSIS — M9905 Segmental and somatic dysfunction of pelvic region: Secondary | ICD-10-CM | POA: Diagnosis not present

## 2022-12-30 DIAGNOSIS — I10 Essential (primary) hypertension: Secondary | ICD-10-CM | POA: Diagnosis not present

## 2022-12-30 DIAGNOSIS — M9903 Segmental and somatic dysfunction of lumbar region: Secondary | ICD-10-CM | POA: Diagnosis not present

## 2022-12-30 NOTE — Patient Instructions (Addendum)
Medication Instructions:  TAKE LABETALOL TWICE A DAY(100 MG) ON NON DIALYSIS DAYS AND ON DIALYSIS DAYS TAKE ONCE A DAY. *If you need a refill on your cardiac medications before your next appointment, please call your pharmacy*   Lab Work: NO LABS If you have labs (blood work) drawn today and your tests are completely normal, you will receive your results only by: MyChart Message (if you have MyChart) OR A paper copy in the mail If you have any lab test that is abnormal or we need to change your treatment, we will call you to review the results.   Testing/Procedures: NO TESTING   Follow-Up: At [facility], you and your health needs are our priority.  As part of our continuing mission to provide you with exceptional heart care, we have created designated Provider Care Teams.  These Care Teams include your primary Cardiologist (physician) and Advanced Practice Providers (APPs -  Physician Assistants and Nurse Practitioners) who all work together to provide you with the care you need, when you need it.  We recommend signing up for the patient portal called "MyChart".  Sign up information is provided on this After Visit Summary.  MyChart is used to connect with patients for Virtual Visits (Telemedicine).  Patients are able to view lab/test results, encounter notes, upcoming appointments, etc.  Non-urgent messages can be sent to your provider as well.   To learn more about what you can do with MyChart, go to [email or web address].    Your next appointment:   2 week(s)  Provider:   Joni Reining, DNP, ANP  Other Instructions TAKE BLOOD PRESSURE DAILY AT THE SAME TIME EACH DAY, BRING RECORD OF BLOOD PRESSURES TO FOLLOW UP APPOINTMENT.

## 2022-12-31 DIAGNOSIS — R197 Diarrhea, unspecified: Secondary | ICD-10-CM | POA: Diagnosis not present

## 2022-12-31 DIAGNOSIS — Z992 Dependence on renal dialysis: Secondary | ICD-10-CM | POA: Diagnosis not present

## 2022-12-31 DIAGNOSIS — N186 End stage renal disease: Secondary | ICD-10-CM | POA: Diagnosis not present

## 2022-12-31 DIAGNOSIS — N2581 Secondary hyperparathyroidism of renal origin: Secondary | ICD-10-CM | POA: Diagnosis not present

## 2023-01-02 DIAGNOSIS — Z992 Dependence on renal dialysis: Secondary | ICD-10-CM | POA: Diagnosis not present

## 2023-01-02 DIAGNOSIS — R197 Diarrhea, unspecified: Secondary | ICD-10-CM | POA: Diagnosis not present

## 2023-01-02 DIAGNOSIS — N2581 Secondary hyperparathyroidism of renal origin: Secondary | ICD-10-CM | POA: Diagnosis not present

## 2023-01-02 DIAGNOSIS — N186 End stage renal disease: Secondary | ICD-10-CM | POA: Diagnosis not present

## 2023-01-05 DIAGNOSIS — Z992 Dependence on renal dialysis: Secondary | ICD-10-CM | POA: Diagnosis not present

## 2023-01-05 DIAGNOSIS — N2581 Secondary hyperparathyroidism of renal origin: Secondary | ICD-10-CM | POA: Diagnosis not present

## 2023-01-05 DIAGNOSIS — N186 End stage renal disease: Secondary | ICD-10-CM | POA: Diagnosis not present

## 2023-01-06 DIAGNOSIS — M9905 Segmental and somatic dysfunction of pelvic region: Secondary | ICD-10-CM | POA: Diagnosis not present

## 2023-01-06 DIAGNOSIS — M9903 Segmental and somatic dysfunction of lumbar region: Secondary | ICD-10-CM | POA: Diagnosis not present

## 2023-01-06 DIAGNOSIS — M7918 Myalgia, other site: Secondary | ICD-10-CM | POA: Diagnosis not present

## 2023-01-06 DIAGNOSIS — M9904 Segmental and somatic dysfunction of sacral region: Secondary | ICD-10-CM | POA: Diagnosis not present

## 2023-01-07 DIAGNOSIS — Z992 Dependence on renal dialysis: Secondary | ICD-10-CM | POA: Diagnosis not present

## 2023-01-07 DIAGNOSIS — N186 End stage renal disease: Secondary | ICD-10-CM | POA: Diagnosis not present

## 2023-01-07 DIAGNOSIS — N2581 Secondary hyperparathyroidism of renal origin: Secondary | ICD-10-CM | POA: Diagnosis not present

## 2023-01-08 DIAGNOSIS — E113593 Type 2 diabetes mellitus with proliferative diabetic retinopathy without macular edema, bilateral: Secondary | ICD-10-CM | POA: Diagnosis not present

## 2023-01-09 DIAGNOSIS — N186 End stage renal disease: Secondary | ICD-10-CM | POA: Diagnosis not present

## 2023-01-09 DIAGNOSIS — N2581 Secondary hyperparathyroidism of renal origin: Secondary | ICD-10-CM | POA: Diagnosis not present

## 2023-01-09 DIAGNOSIS — Z992 Dependence on renal dialysis: Secondary | ICD-10-CM | POA: Diagnosis not present

## 2023-01-11 NOTE — Progress Notes (Deleted)
Cardiology Clinic Note   Patient Name: Alison Baker Date of Encounter: 01/11/2023  Primary Care Provider:  Marlyn Corporal, PA Primary Cardiologist:  Maisie Fus, MD  Patient Profile    67 year old female with history of end-stage renal disease with dialysis on Monday Wednesday Friday, stress echo in 2022 was nondiagnostic, she remained on high doses of labetalol with ongoing hypertension. Echocardiogram with a EF of 55 to 60% with no diastolic dysfunction.   History of minimal CAD per cardiac CTA on 06/06/2021 with a calcium score of 146. She was to be on risk factor modification. She was not placed on statin. She was to continue her labetalol and amlodipine.  She was last seen in the office for preoperative cardiac evaluation on 12/30/2022 and was found to have significantly elevated blood pressure and therefore was not cleared until blood pressure was under control.  She had not taken her antihypertensives the day of the office visit.  She was started back on labetalol 200 mg daily (100 mg twice daily) on nondialysis days, on dialysis days she is to take labetalol 100 mg on returning home from dialysis and to take amlodipine 5 mg nightly as directed.  If her blood pressure is elevated she may take an additional 5 mg of amlodipine in the evening.  Plan was to repeat echocardiogram on this office visit if blood pressure is better controlled.  Past Medical History    Past Medical History:  Diagnosis Date   Anemia    Anxiety    Complication of anesthesia    slow to awaken, sentive to medications rarely even takes a Tylenol.   Diarrhea    End-stage kidney disease (HCC)    M/W/F- [facility]   Gastric ulcer with hemorrhage    GERD (gastroesophageal reflux disease)    Hyperlipidemia    Hypertension    Pneumonia    PONV (postoperative nausea and vomiting)    after hysterectomy- 1980's   Pre-diabetes    Umbilical hernia    Past Surgical History:  Procedure Laterality Date   APPENDECTOMY      BASCILIC VEIN TRANSPOSITION Left 07/15/2016   Procedure: BRACHIAL VEIN TRANSPOSITION FIRST STAGE;  Surgeon: Fransisco Hertz, MD;  Location: [facility] OR;  Service: Vascular;  Laterality: Left;   BASCILIC VEIN TRANSPOSITION Left 11/04/2016   Procedure: SECOND STAGE BRACHIAL VEIN TRANSPOSITION;  Surgeon: Fransisco Hertz, MD;  Location: [facility] OR;  Service: Vascular;  Laterality: Left;   CHOLECYSTECTOMY     partial hepatectomy   COLONOSCOPY W/ POLYPECTOMY     EYE SURGERY Right    catarct   EYE SURGERY Left 08/2016   cataract   INSERTION OF DIALYSIS CATHETER     IR GENERIC HISTORICAL  06/23/2016   IR US GUIDE VASC ACCESS RIGHT 06/23/2016 Oley Balm, MD [facility]   IR GENERIC HISTORICAL  06/23/2016   IR FLUORO GUIDE CV LINE RIGHT 06/23/2016 Oley Balm, MD [facility]   TOTAL ABDOMINAL HYSTERECTOMY W/ BILATERAL SALPINGOOPHORECTOMY     TRABECULECTOMY Bilateral     Allergies  Allergies  Allergen Reactions   Nsaids Other (See Comments)    Has a healing stomach ulcer   Penicillins Rash and Other (See Comments)    Rash and rib pain.  PATIENT HAD A PCN REACTION WITH IMMEDIATE RASH, FACIAL/TONGUE/THROAT SWELLING, SOB, OR LIGHTHEADEDNESS WITH HYPOTENSION:  #  #  #  YES  #  #  #   Has patient had a PCN reaction causing severe rash involving mucus membranes  or skin necrosis: No Has patient had a PCN reaction that required hospitalization: No Has patient had a PCN reaction occurring within the last 10 years: No.    Strawberry Extract Swelling    SWELLING REACTION UNSPECIFIED    Clonidine Nausea And Vomiting   Sulfa Antibiotics Rash and Other (See Comments)    Rib pain    History of Present Illness    [name] comes today for reevaluation of blood pressure and preoperative cardiac evaluation.  Last office visit medications were adjusted as described above.  Home Medications    Current Outpatient Medications  Medication Sig Dispense Refill   acetaminophen (TYLENOL) 500 MG tablet Take 500  mg by mouth 3 (three) times daily as needed for headache.     amLODipine (NORVASC) 10 MG tablet Take 10 mg by mouth See admin instructions. Takes on non dialysis days, Sunday, Tuesday, Thursday and Saturday takes at bedtime     calcium carbonate (TUMS EX) 750 MG chewable tablet Chew 1 tablet by mouth daily.     fluticasone (FLONASE) 50 MCG/ACT nasal spray Place into both nostrils as needed. (Patient not taking: Reported on 12/30/2022)     folic acid-vitamin b complex-vitamin c-selenium-zinc (DIALYVITE) 3 MG TABS tablet Take 1 tablet by mouth daily.     Hyoscyamine Sulfate SL (LEVSIN/SL) 0.125 MG SUBL Place 1 tablet under the tongue daily as needed. (Patient not taking: Reported on 12/30/2022) 30 tablet 3   labetalol (NORMODYNE) 100 MG tablet Take 1 tablet by mouth 2 (two) times daily.     lansoprazole (PREVACID) 30 MG capsule Take 1 capsule (30 mg total) by mouth daily at 12 noon. 30 capsule 3   meclizine (ANTIVERT) 25 MG tablet Take 25 mg by mouth 3 (three) times daily as needed for dizziness.     No current facility-administered medications for this visit.     Family History    Family History  Problem Relation Age of Onset   Hypertension Mother    Cancer Sister        vulvular cancer -then mets to brain, lung   Colon cancer Neg Hx    Esophageal cancer Neg Hx    Stomach cancer Neg Hx    Pancreatic cancer Neg Hx    Liver disease Neg Hx    She indicated that her mother is alive. She indicated that her father is alive. She indicated that the status of her sister is unknown. She indicated that the status of her neg hx is unknown.  Social History    Social History   Socioeconomic History   Marital status: Married    Spouse name: Not on file   Number of children: Not on file   Years of education: Not on file   Highest education level: Not on file  Occupational History   Not on file  Tobacco Use   Smoking status: Former    Current packs/day: 0.00    Types: Cigarettes    Quit date:  06/27/1995    Years since quitting: 27.5   Smokeless tobacco: Never  Vaping Use   Vaping status: Never Used  Substance and Sexual Activity   Alcohol use: No   Drug use: No   Sexual activity: Not on file  Other Topics Concern   Not on file  Social History Narrative   Not on file   Social Determinants of Health   Financial Resource Strain: Not on file  Food Insecurity: Not on file  Transportation Needs: Not on file  Physical Activity: Not on file  Stress: Not on file  Social Connections: Not on file  Intimate Partner Violence: Not on file     Review of Systems    General:  No chills, fever, night sweats or weight changes.  Cardiovascular:  No chest pain, dyspnea on exertion, edema, orthopnea, palpitations, paroxysmal nocturnal dyspnea. Dermatological: No rash, lesions/masses Respiratory: No cough, dyspnea Urologic: No hematuria, dysuria Abdominal:   No nausea, vomiting, diarrhea, bright red blood per rectum, melena, or hematemesis Neurologic:  No visual changes, wkns, changes in mental status. All other systems reviewed and are otherwise negative except as noted above.       Physical Exam    VS:  There were no vitals taken for this visit. , BMI There is no height or weight on file to calculate BMI.     GEN: Well nourished, well developed, in no acute distress. HEENT: normal. Neck: Supple, no JVD, carotid bruits, or masses. Cardiac: RRR, no murmurs, rubs, or gallops. No clubbing, cyanosis, edema.  Radials/DP/PT 2+ and equal bilaterally.  Respiratory:  Respirations regular and unlabored, clear to auscultation bilaterally. GI: Soft, nontender, nondistended, BS + x 4. MS: no deformity or atrophy. Skin: warm and dry, no rash. Neuro:  Strength and sensation are intact. Psych: Normal affect.      Lab Results  Component Value Date   WBC 13.2 (H) 09/13/2016   HGB 10.5 (L) 11/04/2016   HCT 31.0 (L) 11/04/2016   MCV 104.2 (H) 09/13/2016   PLT 195 09/13/2016   Lab  Results  Component Value Date   CREATININE 6.40 (H) 05/28/2021   BUN 29 (H) 05/28/2021   NA 142 05/28/2021   K 4.6 05/28/2021   CL 97 05/28/2021   CO2 24 05/28/2021   Lab Results  Component Value Date   ALT 10 (L) 06/13/2016   AST 11 (L) 06/13/2016   ALKPHOS 97 06/13/2016   BILITOT 1.5 (H) 06/13/2016   No results found for: "CHOL", "HDL", "LDLCALC", "LDLDIRECT", "TRIG", "CHOLHDL"  Lab Results  Component Value Date   HGBA1C 4.7 (L) 06/13/2016     Review of Prior Studies    Cardiac CTA 06/06/2021 IMPRESSION: 1. 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   2. Coronary calcium score of 146. This was 88th percentile for age and sex matched control.   3. Normal coronary origin with right dominance.   4. Dilated main pulmonary artery at 28 mm, suggestive of pulmonary hypertension.   5. Aortic atherosclerosis and aortic valve annular calcification.   6. Aggressive cardiovascular risk factor modification is recommended.   Stress echocardiogram dated 11/13/2020: (Scanned document) LV size was normal in size normal ventricular contractility overall LVEF of 55 to 60%.  Abnormal stress echo with reduced exercise tolerance with hypotensive blood pressure response inferior to decreased LV volume although hypokinesis was not seen.  Assessment & Plan   1.  ***     {Are you ordering a CV Procedure (e.g. stress test, cath, DCCV, TEE, etc)?   Press F2        :[id number]}   Signed, Bettey Mare. Liborio Nixon, ANP, AACC   01/11/2023 3:39 PM      Office [phone] Fax [phone]  Notice: This dictation was prepared with Dragon dictation along with smaller phrase technology. Any transcriptional errors that result from this process are unintentional and may not be corrected upon review.

## 2023-01-12 DIAGNOSIS — R197 Diarrhea, unspecified: Secondary | ICD-10-CM | POA: Diagnosis not present

## 2023-01-12 DIAGNOSIS — N186 End stage renal disease: Secondary | ICD-10-CM | POA: Diagnosis not present

## 2023-01-12 DIAGNOSIS — Z992 Dependence on renal dialysis: Secondary | ICD-10-CM | POA: Diagnosis not present

## 2023-01-12 DIAGNOSIS — N2581 Secondary hyperparathyroidism of renal origin: Secondary | ICD-10-CM | POA: Diagnosis not present

## 2023-01-13 ENCOUNTER — Ambulatory Visit: Payer: BC Managed Care – PPO | Admitting: Adult Health

## 2023-01-13 DIAGNOSIS — M9904 Segmental and somatic dysfunction of sacral region: Secondary | ICD-10-CM | POA: Diagnosis not present

## 2023-01-13 DIAGNOSIS — M9905 Segmental and somatic dysfunction of pelvic region: Secondary | ICD-10-CM | POA: Diagnosis not present

## 2023-01-13 DIAGNOSIS — M7918 Myalgia, other site: Secondary | ICD-10-CM | POA: Diagnosis not present

## 2023-01-13 DIAGNOSIS — M9903 Segmental and somatic dysfunction of lumbar region: Secondary | ICD-10-CM | POA: Diagnosis not present

## 2023-01-14 DIAGNOSIS — N186 End stage renal disease: Secondary | ICD-10-CM | POA: Diagnosis not present

## 2023-01-14 DIAGNOSIS — Z992 Dependence on renal dialysis: Secondary | ICD-10-CM | POA: Diagnosis not present

## 2023-01-14 DIAGNOSIS — R197 Diarrhea, unspecified: Secondary | ICD-10-CM | POA: Diagnosis not present

## 2023-01-14 DIAGNOSIS — N2581 Secondary hyperparathyroidism of renal origin: Secondary | ICD-10-CM | POA: Diagnosis not present

## 2023-01-16 DIAGNOSIS — N2581 Secondary hyperparathyroidism of renal origin: Secondary | ICD-10-CM | POA: Diagnosis not present

## 2023-01-16 DIAGNOSIS — R197 Diarrhea, unspecified: Secondary | ICD-10-CM | POA: Diagnosis not present

## 2023-01-16 DIAGNOSIS — N186 End stage renal disease: Secondary | ICD-10-CM | POA: Diagnosis not present

## 2023-01-16 DIAGNOSIS — Z992 Dependence on renal dialysis: Secondary | ICD-10-CM | POA: Diagnosis not present

## 2023-01-19 DIAGNOSIS — N186 End stage renal disease: Secondary | ICD-10-CM | POA: Diagnosis not present

## 2023-01-19 DIAGNOSIS — Z992 Dependence on renal dialysis: Secondary | ICD-10-CM | POA: Diagnosis not present

## 2023-01-19 DIAGNOSIS — N2581 Secondary hyperparathyroidism of renal origin: Secondary | ICD-10-CM | POA: Diagnosis not present

## 2023-01-19 DIAGNOSIS — R197 Diarrhea, unspecified: Secondary | ICD-10-CM | POA: Diagnosis not present

## 2023-01-19 DIAGNOSIS — Z6827 Body mass index (BMI) 27.0-27.9, adult: Secondary | ICD-10-CM | POA: Diagnosis not present

## 2023-01-19 DIAGNOSIS — R300 Dysuria: Secondary | ICD-10-CM | POA: Diagnosis not present

## 2023-01-19 DIAGNOSIS — N3000 Acute cystitis without hematuria: Secondary | ICD-10-CM | POA: Diagnosis not present

## 2023-01-21 DIAGNOSIS — Z992 Dependence on renal dialysis: Secondary | ICD-10-CM | POA: Diagnosis not present

## 2023-01-21 DIAGNOSIS — N186 End stage renal disease: Secondary | ICD-10-CM | POA: Diagnosis not present

## 2023-01-21 DIAGNOSIS — N2581 Secondary hyperparathyroidism of renal origin: Secondary | ICD-10-CM | POA: Diagnosis not present

## 2023-01-21 DIAGNOSIS — R197 Diarrhea, unspecified: Secondary | ICD-10-CM | POA: Diagnosis not present

## 2023-01-23 DIAGNOSIS — Z992 Dependence on renal dialysis: Secondary | ICD-10-CM | POA: Diagnosis not present

## 2023-01-23 DIAGNOSIS — N186 End stage renal disease: Secondary | ICD-10-CM | POA: Diagnosis not present

## 2023-01-23 DIAGNOSIS — N2581 Secondary hyperparathyroidism of renal origin: Secondary | ICD-10-CM | POA: Diagnosis not present

## 2023-01-23 DIAGNOSIS — R197 Diarrhea, unspecified: Secondary | ICD-10-CM | POA: Diagnosis not present

## 2023-01-26 DIAGNOSIS — N186 End stage renal disease: Secondary | ICD-10-CM | POA: Diagnosis not present

## 2023-01-26 DIAGNOSIS — N2581 Secondary hyperparathyroidism of renal origin: Secondary | ICD-10-CM | POA: Diagnosis not present

## 2023-01-26 DIAGNOSIS — I129 Hypertensive chronic kidney disease with stage 1 through stage 4 chronic kidney disease, or unspecified chronic kidney disease: Secondary | ICD-10-CM | POA: Diagnosis not present

## 2023-01-26 DIAGNOSIS — Z992 Dependence on renal dialysis: Secondary | ICD-10-CM | POA: Diagnosis not present

## 2023-01-28 DIAGNOSIS — N186 End stage renal disease: Secondary | ICD-10-CM | POA: Diagnosis not present

## 2023-01-28 DIAGNOSIS — E8779 Other fluid overload: Secondary | ICD-10-CM | POA: Diagnosis not present

## 2023-01-28 DIAGNOSIS — N2581 Secondary hyperparathyroidism of renal origin: Secondary | ICD-10-CM | POA: Diagnosis not present

## 2023-01-28 DIAGNOSIS — Z992 Dependence on renal dialysis: Secondary | ICD-10-CM | POA: Diagnosis not present

## 2023-01-28 DIAGNOSIS — E8770 Fluid overload, unspecified: Secondary | ICD-10-CM | POA: Diagnosis not present

## 2023-01-29 DIAGNOSIS — M7918 Myalgia, other site: Secondary | ICD-10-CM | POA: Diagnosis not present

## 2023-01-29 DIAGNOSIS — M9904 Segmental and somatic dysfunction of sacral region: Secondary | ICD-10-CM | POA: Diagnosis not present

## 2023-01-29 DIAGNOSIS — M9903 Segmental and somatic dysfunction of lumbar region: Secondary | ICD-10-CM | POA: Diagnosis not present

## 2023-01-29 DIAGNOSIS — M9905 Segmental and somatic dysfunction of pelvic region: Secondary | ICD-10-CM | POA: Diagnosis not present

## 2023-01-30 DIAGNOSIS — N2581 Secondary hyperparathyroidism of renal origin: Secondary | ICD-10-CM | POA: Diagnosis not present

## 2023-01-30 DIAGNOSIS — E8779 Other fluid overload: Secondary | ICD-10-CM | POA: Diagnosis not present

## 2023-01-30 DIAGNOSIS — Z992 Dependence on renal dialysis: Secondary | ICD-10-CM | POA: Diagnosis not present

## 2023-01-30 DIAGNOSIS — N186 End stage renal disease: Secondary | ICD-10-CM | POA: Diagnosis not present

## 2023-01-30 DIAGNOSIS — E8770 Fluid overload, unspecified: Secondary | ICD-10-CM | POA: Diagnosis not present

## 2023-01-31 DIAGNOSIS — Z992 Dependence on renal dialysis: Secondary | ICD-10-CM | POA: Diagnosis not present

## 2023-01-31 DIAGNOSIS — E8770 Fluid overload, unspecified: Secondary | ICD-10-CM | POA: Diagnosis not present

## 2023-01-31 DIAGNOSIS — E8779 Other fluid overload: Secondary | ICD-10-CM | POA: Diagnosis not present

## 2023-01-31 DIAGNOSIS — N2581 Secondary hyperparathyroidism of renal origin: Secondary | ICD-10-CM | POA: Diagnosis not present

## 2023-01-31 DIAGNOSIS — N186 End stage renal disease: Secondary | ICD-10-CM | POA: Diagnosis not present

## 2023-02-02 DIAGNOSIS — N2581 Secondary hyperparathyroidism of renal origin: Secondary | ICD-10-CM | POA: Diagnosis not present

## 2023-02-02 DIAGNOSIS — N186 End stage renal disease: Secondary | ICD-10-CM | POA: Diagnosis not present

## 2023-02-02 DIAGNOSIS — Z992 Dependence on renal dialysis: Secondary | ICD-10-CM | POA: Diagnosis not present

## 2023-02-03 DIAGNOSIS — N2581 Secondary hyperparathyroidism of renal origin: Secondary | ICD-10-CM | POA: Diagnosis not present

## 2023-02-03 DIAGNOSIS — Z992 Dependence on renal dialysis: Secondary | ICD-10-CM | POA: Diagnosis not present

## 2023-02-03 DIAGNOSIS — N186 End stage renal disease: Secondary | ICD-10-CM | POA: Diagnosis not present

## 2023-02-04 DIAGNOSIS — E113592 Type 2 diabetes mellitus with proliferative diabetic retinopathy without macular edema, left eye: Secondary | ICD-10-CM | POA: Diagnosis not present

## 2023-02-06 DIAGNOSIS — N186 End stage renal disease: Secondary | ICD-10-CM | POA: Diagnosis not present

## 2023-02-06 DIAGNOSIS — Z992 Dependence on renal dialysis: Secondary | ICD-10-CM | POA: Diagnosis not present

## 2023-02-06 DIAGNOSIS — N2581 Secondary hyperparathyroidism of renal origin: Secondary | ICD-10-CM | POA: Diagnosis not present

## 2023-02-09 DIAGNOSIS — Z992 Dependence on renal dialysis: Secondary | ICD-10-CM | POA: Diagnosis not present

## 2023-02-09 DIAGNOSIS — N186 End stage renal disease: Secondary | ICD-10-CM | POA: Diagnosis not present

## 2023-02-09 DIAGNOSIS — R197 Diarrhea, unspecified: Secondary | ICD-10-CM | POA: Diagnosis not present

## 2023-02-09 DIAGNOSIS — N2581 Secondary hyperparathyroidism of renal origin: Secondary | ICD-10-CM | POA: Diagnosis not present

## 2023-02-10 DIAGNOSIS — Z6827 Body mass index (BMI) 27.0-27.9, adult: Secondary | ICD-10-CM | POA: Diagnosis not present

## 2023-02-10 DIAGNOSIS — N3000 Acute cystitis without hematuria: Secondary | ICD-10-CM | POA: Diagnosis not present

## 2023-02-12 DIAGNOSIS — M9903 Segmental and somatic dysfunction of lumbar region: Secondary | ICD-10-CM | POA: Diagnosis not present

## 2023-02-12 DIAGNOSIS — M9904 Segmental and somatic dysfunction of sacral region: Secondary | ICD-10-CM | POA: Diagnosis not present

## 2023-02-12 DIAGNOSIS — M7918 Myalgia, other site: Secondary | ICD-10-CM | POA: Diagnosis not present

## 2023-02-12 DIAGNOSIS — M9905 Segmental and somatic dysfunction of pelvic region: Secondary | ICD-10-CM | POA: Diagnosis not present

## 2023-02-13 DIAGNOSIS — Z992 Dependence on renal dialysis: Secondary | ICD-10-CM | POA: Diagnosis not present

## 2023-02-13 DIAGNOSIS — R197 Diarrhea, unspecified: Secondary | ICD-10-CM | POA: Diagnosis not present

## 2023-02-13 DIAGNOSIS — N186 End stage renal disease: Secondary | ICD-10-CM | POA: Diagnosis not present

## 2023-02-13 DIAGNOSIS — N2581 Secondary hyperparathyroidism of renal origin: Secondary | ICD-10-CM | POA: Diagnosis not present

## 2023-02-16 DIAGNOSIS — Z992 Dependence on renal dialysis: Secondary | ICD-10-CM | POA: Diagnosis not present

## 2023-02-16 DIAGNOSIS — Z23 Encounter for immunization: Secondary | ICD-10-CM | POA: Diagnosis not present

## 2023-02-16 DIAGNOSIS — R197 Diarrhea, unspecified: Secondary | ICD-10-CM | POA: Diagnosis not present

## 2023-02-16 DIAGNOSIS — N2581 Secondary hyperparathyroidism of renal origin: Secondary | ICD-10-CM | POA: Diagnosis not present

## 2023-02-16 DIAGNOSIS — N186 End stage renal disease: Secondary | ICD-10-CM | POA: Diagnosis not present

## 2023-02-18 DIAGNOSIS — Z992 Dependence on renal dialysis: Secondary | ICD-10-CM | POA: Diagnosis not present

## 2023-02-18 DIAGNOSIS — Z23 Encounter for immunization: Secondary | ICD-10-CM | POA: Diagnosis not present

## 2023-02-18 DIAGNOSIS — N2581 Secondary hyperparathyroidism of renal origin: Secondary | ICD-10-CM | POA: Diagnosis not present

## 2023-02-18 DIAGNOSIS — R197 Diarrhea, unspecified: Secondary | ICD-10-CM | POA: Diagnosis not present

## 2023-02-18 DIAGNOSIS — N186 End stage renal disease: Secondary | ICD-10-CM | POA: Diagnosis not present

## 2023-02-20 DIAGNOSIS — N186 End stage renal disease: Secondary | ICD-10-CM | POA: Diagnosis not present

## 2023-02-20 DIAGNOSIS — Z992 Dependence on renal dialysis: Secondary | ICD-10-CM | POA: Diagnosis not present

## 2023-02-20 DIAGNOSIS — N2581 Secondary hyperparathyroidism of renal origin: Secondary | ICD-10-CM | POA: Diagnosis not present

## 2023-02-20 DIAGNOSIS — Z23 Encounter for immunization: Secondary | ICD-10-CM | POA: Diagnosis not present

## 2023-02-20 DIAGNOSIS — R197 Diarrhea, unspecified: Secondary | ICD-10-CM | POA: Diagnosis not present

## 2023-02-23 DIAGNOSIS — Z992 Dependence on renal dialysis: Secondary | ICD-10-CM | POA: Diagnosis not present

## 2023-02-23 DIAGNOSIS — N2581 Secondary hyperparathyroidism of renal origin: Secondary | ICD-10-CM | POA: Diagnosis not present

## 2023-02-23 DIAGNOSIS — N186 End stage renal disease: Secondary | ICD-10-CM | POA: Diagnosis not present

## 2023-02-23 NOTE — Progress Notes (Unsigned)
Cardiology Office Note:  .   Date:  02/23/2023  ID:  Sherlyn Packman, DOB 25-Feb-1956, MRN [id number] PCP: Marlyn Corporal, PA  [facility] Providers Cardiologist:  Maisie Fus, MD   History of Present Illness: .   Alison Baker is a 67 y.o. female with history of end-stage renal disease with dialysis on Monday Wednesday Friday, stress echo in 2022 was nondiagnostic, she remained on high doses of labetalol with ongoing hypertension. Echocardiogram with a EF of 55 to 60% with no diastolic dysfunction, minimal CAD per cardiac CTA on 06/06/2021 with a calcium score of 146. She was to be on risk factor modification. She was not placed on statin. Started back on labetalol 200 mg daily.   Since being seen last the patient has been keeping up with blood pressure at home she has had a 20 point drop systolic with increased dose of labetalol to 100 mg twice daily.  She has been seen by her nephrologist Dr. Roseanne Reno who is added losartan 25 mg daily, and has recently titrated up to 50 mg daily which she has not yet started.  The patient is under a lot of stress in her family with a sick husband, trying to run a business, go to dialysis, and also to Take Care of a 66 year old father who is blind.  She is having a lot of complaints of insomnia and mild anxiety associated with all of these responsibilities.  She believes this is also playing into her blood pressure control.  ROS: As above otherwise negative.  Studies Reviewed: Marland Kitchen   Coronary CTA  IMPRESSION: 1. 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   2. Coronary calcium score of 146. This was 88th percentile for age and sex matched control.   3. Normal coronary origin with right dominance.   4. Dilated main pulmonary artery at 28 mm, suggestive of pulmonary hypertension.   5. Aortic atherosclerosis and aortic valve annular calcification.   6. Aggressive cardiovascular risk factor modification  is recommended. Physical Exam:   VS:  There were no vitals taken for this visit.   Wt Readings from Last 3 Encounters:  12/30/22 157 lb 3.2 oz (71.3 kg)  08/16/21 174 lb 3.2 oz (79 kg)  08/13/21 169 lb (76.7 kg)    GEN: Well nourished, well developed in no acute distress NECK: No JVD; No carotid bruits CARDIAC: RRR with radiation murmur from dialysis fistula,, no rubs, gallops RESPIRATORY:  Clear to auscultation in the upper lobes with bibasilar crackles noted. ABDOMEN: Soft, non-tender, non-distended EXTREMITIES:  No edema; No deformity   ASSESSMENT AND PLAN: .    Hypertension: Not well-controlled.  Being followed by nephrology and cardiology.  She has now been placed on losartan 50 mg daily by nephrology.  She is due to start this today.  From a cardiology standpoint I would be in favor of titrating up to a 100 mg daily over time to keep blood pressure 140/70 or less.  Continue labetalol 100 mg twice daily, amlodipine 10 mg.  Consideration for adding hydralazine if Heist is of ARB is not effective.   2.  Chronic positional dizziness: Remains on meclizine 25 mg tablets 3 times daily as needed.  3.  ESRD: [facility]ed by [facility], Dr. Roseanne Reno.  4.  Situational anxiety and stress: Would recommend that the patient be placed on low-dose antianxiety medication which she can take at night to help with insomnia.  She is only  getting 3 hours of sleep at night.  I think addition of Ativan or Xanax at nighttime may be helpful to her.  Of asked her to talk with Dr. Glenna Fellows about having a prescription if she does not have a consistent primary care provider as several providers have left the practice.         Signed, Bettey Mare. Liborio Nixon, ANP, AACC

## 2023-02-24 DIAGNOSIS — M9905 Segmental and somatic dysfunction of pelvic region: Secondary | ICD-10-CM | POA: Diagnosis not present

## 2023-02-24 DIAGNOSIS — M9904 Segmental and somatic dysfunction of sacral region: Secondary | ICD-10-CM | POA: Diagnosis not present

## 2023-02-24 DIAGNOSIS — N185 Chronic kidney disease, stage 5: Secondary | ICD-10-CM | POA: Diagnosis not present

## 2023-02-24 DIAGNOSIS — Z6827 Body mass index (BMI) 27.0-27.9, adult: Secondary | ICD-10-CM | POA: Diagnosis not present

## 2023-02-24 DIAGNOSIS — M7918 Myalgia, other site: Secondary | ICD-10-CM | POA: Diagnosis not present

## 2023-02-24 DIAGNOSIS — M9903 Segmental and somatic dysfunction of lumbar region: Secondary | ICD-10-CM | POA: Diagnosis not present

## 2023-02-24 DIAGNOSIS — I129 Hypertensive chronic kidney disease with stage 1 through stage 4 chronic kidney disease, or unspecified chronic kidney disease: Secondary | ICD-10-CM | POA: Diagnosis not present

## 2023-02-24 DIAGNOSIS — L98499 Non-pressure chronic ulcer of skin of other sites with unspecified severity: Secondary | ICD-10-CM | POA: Diagnosis not present

## 2023-02-25 DIAGNOSIS — N2581 Secondary hyperparathyroidism of renal origin: Secondary | ICD-10-CM | POA: Diagnosis not present

## 2023-02-25 DIAGNOSIS — N186 End stage renal disease: Secondary | ICD-10-CM | POA: Diagnosis not present

## 2023-02-25 DIAGNOSIS — Z992 Dependence on renal dialysis: Secondary | ICD-10-CM | POA: Diagnosis not present

## 2023-02-26 ENCOUNTER — Ambulatory Visit: Payer: BC Managed Care – PPO | Attending: Adult Health | Admitting: Adult Health

## 2023-02-26 ENCOUNTER — Encounter: Payer: Self-pay | Admitting: Adult Health

## 2023-02-26 VITALS — BP 190/70 | HR 61 | Ht 63.5 in | Wt 156.0 lb

## 2023-02-26 DIAGNOSIS — I129 Hypertensive chronic kidney disease with stage 1 through stage 4 chronic kidney disease, or unspecified chronic kidney disease: Secondary | ICD-10-CM | POA: Diagnosis not present

## 2023-02-26 DIAGNOSIS — N186 End stage renal disease: Secondary | ICD-10-CM | POA: Diagnosis not present

## 2023-02-26 DIAGNOSIS — R42 Dizziness and giddiness: Secondary | ICD-10-CM | POA: Diagnosis not present

## 2023-02-26 DIAGNOSIS — I10 Essential (primary) hypertension: Secondary | ICD-10-CM | POA: Diagnosis not present

## 2023-02-26 DIAGNOSIS — Z992 Dependence on renal dialysis: Secondary | ICD-10-CM | POA: Diagnosis not present

## 2023-02-26 DIAGNOSIS — F439 Reaction to severe stress, unspecified: Secondary | ICD-10-CM | POA: Diagnosis not present

## 2023-02-26 NOTE — Patient Instructions (Signed)
Medication Instructions:  No Changes *If you need a refill on your cardiac medications before your next appointment, please call your pharmacy*   Lab Work: No Labs If you have labs (blood work) drawn today and your tests are completely normal, you will receive your results only by: MyChart Message (if you have MyChart) OR A paper copy in the mail If you have any lab test that is abnormal or we need to change your treatment, we will call you to review the results.   Testing/Procedures: No Testing   Follow-Up: At [facility], you and your health needs are our priority.  As part of our continuing mission to provide you with exceptional heart care, we have created designated Provider Care Teams.  These Care Teams include your primary Cardiologist (physician) and Advanced Practice Providers (APPs -  Physician Assistants and Nurse Practitioners) who all work together to provide you with the care you need, when you need it.  We recommend signing up for the patient portal called "MyChart".  Sign up information is provided on this After Visit Summary.  MyChart is used to connect with patients for Virtual Visits (Telemedicine).  Patients are able to view lab/test results, encounter notes, upcoming appointments, etc.  Non-urgent messages can be sent to your provider as well.   To learn more about what you can do with MyChart, go to [email or web address].    Your next appointment:   4 month(s)  Provider:   Joni Reining, DNP, ANP

## 2023-02-27 DIAGNOSIS — Z992 Dependence on renal dialysis: Secondary | ICD-10-CM | POA: Diagnosis not present

## 2023-02-27 DIAGNOSIS — N186 End stage renal disease: Secondary | ICD-10-CM | POA: Diagnosis not present

## 2023-02-27 DIAGNOSIS — N2581 Secondary hyperparathyroidism of renal origin: Secondary | ICD-10-CM | POA: Diagnosis not present

## 2023-03-02 DIAGNOSIS — N2581 Secondary hyperparathyroidism of renal origin: Secondary | ICD-10-CM | POA: Diagnosis not present

## 2023-03-02 DIAGNOSIS — N186 End stage renal disease: Secondary | ICD-10-CM | POA: Diagnosis not present

## 2023-03-02 DIAGNOSIS — Z992 Dependence on renal dialysis: Secondary | ICD-10-CM | POA: Diagnosis not present

## 2023-03-05 DIAGNOSIS — L98499 Non-pressure chronic ulcer of skin of other sites with unspecified severity: Secondary | ICD-10-CM | POA: Diagnosis not present

## 2023-03-05 DIAGNOSIS — L89620 Pressure ulcer of left heel, unstageable: Secondary | ICD-10-CM | POA: Diagnosis not present

## 2023-03-06 DIAGNOSIS — N186 End stage renal disease: Secondary | ICD-10-CM | POA: Diagnosis not present

## 2023-03-06 DIAGNOSIS — N2581 Secondary hyperparathyroidism of renal origin: Secondary | ICD-10-CM | POA: Diagnosis not present

## 2023-03-06 DIAGNOSIS — Z992 Dependence on renal dialysis: Secondary | ICD-10-CM | POA: Diagnosis not present

## 2023-03-09 DIAGNOSIS — N186 End stage renal disease: Secondary | ICD-10-CM | POA: Diagnosis not present

## 2023-03-09 DIAGNOSIS — N2581 Secondary hyperparathyroidism of renal origin: Secondary | ICD-10-CM | POA: Diagnosis not present

## 2023-03-09 DIAGNOSIS — Z992 Dependence on renal dialysis: Secondary | ICD-10-CM | POA: Diagnosis not present

## 2023-03-11 DIAGNOSIS — N186 End stage renal disease: Secondary | ICD-10-CM | POA: Diagnosis not present

## 2023-03-11 DIAGNOSIS — Z992 Dependence on renal dialysis: Secondary | ICD-10-CM | POA: Diagnosis not present

## 2023-03-11 DIAGNOSIS — N2581 Secondary hyperparathyroidism of renal origin: Secondary | ICD-10-CM | POA: Diagnosis not present

## 2023-03-12 DIAGNOSIS — E113593 Type 2 diabetes mellitus with proliferative diabetic retinopathy without macular edema, bilateral: Secondary | ICD-10-CM | POA: Diagnosis not present

## 2023-03-13 DIAGNOSIS — N2581 Secondary hyperparathyroidism of renal origin: Secondary | ICD-10-CM | POA: Diagnosis not present

## 2023-03-13 DIAGNOSIS — N186 End stage renal disease: Secondary | ICD-10-CM | POA: Diagnosis not present

## 2023-03-13 DIAGNOSIS — Z992 Dependence on renal dialysis: Secondary | ICD-10-CM | POA: Diagnosis not present

## 2023-03-13 DIAGNOSIS — L89620 Pressure ulcer of left heel, unstageable: Secondary | ICD-10-CM | POA: Diagnosis not present

## 2023-03-13 DIAGNOSIS — L98499 Non-pressure chronic ulcer of skin of other sites with unspecified severity: Secondary | ICD-10-CM | POA: Diagnosis not present

## 2023-03-16 DIAGNOSIS — N2581 Secondary hyperparathyroidism of renal origin: Secondary | ICD-10-CM | POA: Diagnosis not present

## 2023-03-16 DIAGNOSIS — N186 End stage renal disease: Secondary | ICD-10-CM | POA: Diagnosis not present

## 2023-03-16 DIAGNOSIS — Z992 Dependence on renal dialysis: Secondary | ICD-10-CM | POA: Diagnosis not present

## 2023-03-17 DIAGNOSIS — M9905 Segmental and somatic dysfunction of pelvic region: Secondary | ICD-10-CM | POA: Diagnosis not present

## 2023-03-17 DIAGNOSIS — M7918 Myalgia, other site: Secondary | ICD-10-CM | POA: Diagnosis not present

## 2023-03-17 DIAGNOSIS — M9904 Segmental and somatic dysfunction of sacral region: Secondary | ICD-10-CM | POA: Diagnosis not present

## 2023-03-17 DIAGNOSIS — M9903 Segmental and somatic dysfunction of lumbar region: Secondary | ICD-10-CM | POA: Diagnosis not present

## 2023-03-18 DIAGNOSIS — N2581 Secondary hyperparathyroidism of renal origin: Secondary | ICD-10-CM | POA: Diagnosis not present

## 2023-03-18 DIAGNOSIS — N186 End stage renal disease: Secondary | ICD-10-CM | POA: Diagnosis not present

## 2023-03-18 DIAGNOSIS — Z992 Dependence on renal dialysis: Secondary | ICD-10-CM | POA: Diagnosis not present

## 2023-03-20 DIAGNOSIS — Z992 Dependence on renal dialysis: Secondary | ICD-10-CM | POA: Diagnosis not present

## 2023-03-20 DIAGNOSIS — N2581 Secondary hyperparathyroidism of renal origin: Secondary | ICD-10-CM | POA: Diagnosis not present

## 2023-03-20 DIAGNOSIS — N186 End stage renal disease: Secondary | ICD-10-CM | POA: Diagnosis not present

## 2023-03-23 DIAGNOSIS — Z992 Dependence on renal dialysis: Secondary | ICD-10-CM | POA: Diagnosis not present

## 2023-03-23 DIAGNOSIS — N186 End stage renal disease: Secondary | ICD-10-CM | POA: Diagnosis not present

## 2023-03-23 DIAGNOSIS — N2581 Secondary hyperparathyroidism of renal origin: Secondary | ICD-10-CM | POA: Diagnosis not present

## 2023-03-24 DIAGNOSIS — M9903 Segmental and somatic dysfunction of lumbar region: Secondary | ICD-10-CM | POA: Diagnosis not present

## 2023-03-24 DIAGNOSIS — M9904 Segmental and somatic dysfunction of sacral region: Secondary | ICD-10-CM | POA: Diagnosis not present

## 2023-03-24 DIAGNOSIS — M9905 Segmental and somatic dysfunction of pelvic region: Secondary | ICD-10-CM | POA: Diagnosis not present

## 2023-03-24 DIAGNOSIS — M7918 Myalgia, other site: Secondary | ICD-10-CM | POA: Diagnosis not present

## 2023-03-25 DIAGNOSIS — L89620 Pressure ulcer of left heel, unstageable: Secondary | ICD-10-CM | POA: Diagnosis not present

## 2023-03-25 DIAGNOSIS — Z992 Dependence on renal dialysis: Secondary | ICD-10-CM | POA: Diagnosis not present

## 2023-03-25 DIAGNOSIS — N186 End stage renal disease: Secondary | ICD-10-CM | POA: Diagnosis not present

## 2023-03-25 DIAGNOSIS — L98499 Non-pressure chronic ulcer of skin of other sites with unspecified severity: Secondary | ICD-10-CM | POA: Diagnosis not present

## 2023-03-25 DIAGNOSIS — N2581 Secondary hyperparathyroidism of renal origin: Secondary | ICD-10-CM | POA: Diagnosis not present

## 2023-03-27 DIAGNOSIS — N186 End stage renal disease: Secondary | ICD-10-CM | POA: Diagnosis not present

## 2023-03-27 DIAGNOSIS — Z992 Dependence on renal dialysis: Secondary | ICD-10-CM | POA: Diagnosis not present

## 2023-03-27 DIAGNOSIS — N2581 Secondary hyperparathyroidism of renal origin: Secondary | ICD-10-CM | POA: Diagnosis not present

## 2023-03-28 DIAGNOSIS — I129 Hypertensive chronic kidney disease with stage 1 through stage 4 chronic kidney disease, or unspecified chronic kidney disease: Secondary | ICD-10-CM | POA: Diagnosis not present

## 2023-03-28 DIAGNOSIS — Z992 Dependence on renal dialysis: Secondary | ICD-10-CM | POA: Diagnosis not present

## 2023-03-28 DIAGNOSIS — N186 End stage renal disease: Secondary | ICD-10-CM | POA: Diagnosis not present

## 2023-03-30 DIAGNOSIS — N2581 Secondary hyperparathyroidism of renal origin: Secondary | ICD-10-CM | POA: Diagnosis not present

## 2023-03-30 DIAGNOSIS — Z992 Dependence on renal dialysis: Secondary | ICD-10-CM | POA: Diagnosis not present

## 2023-03-30 DIAGNOSIS — N186 End stage renal disease: Secondary | ICD-10-CM | POA: Diagnosis not present

## 2023-04-01 DIAGNOSIS — L89623 Pressure ulcer of left heel, stage 3: Secondary | ICD-10-CM | POA: Diagnosis not present

## 2023-04-01 DIAGNOSIS — N2581 Secondary hyperparathyroidism of renal origin: Secondary | ICD-10-CM | POA: Diagnosis not present

## 2023-04-01 DIAGNOSIS — L89893 Pressure ulcer of other site, stage 3: Secondary | ICD-10-CM | POA: Diagnosis not present

## 2023-04-01 DIAGNOSIS — Z992 Dependence on renal dialysis: Secondary | ICD-10-CM | POA: Diagnosis not present

## 2023-04-01 DIAGNOSIS — N186 End stage renal disease: Secondary | ICD-10-CM | POA: Diagnosis not present

## 2023-04-01 DIAGNOSIS — L98499 Non-pressure chronic ulcer of skin of other sites with unspecified severity: Secondary | ICD-10-CM | POA: Diagnosis not present

## 2023-04-03 DIAGNOSIS — N186 End stage renal disease: Secondary | ICD-10-CM | POA: Diagnosis not present

## 2023-04-03 DIAGNOSIS — Z992 Dependence on renal dialysis: Secondary | ICD-10-CM | POA: Diagnosis not present

## 2023-04-03 DIAGNOSIS — N2581 Secondary hyperparathyroidism of renal origin: Secondary | ICD-10-CM | POA: Diagnosis not present

## 2023-04-06 DIAGNOSIS — Z992 Dependence on renal dialysis: Secondary | ICD-10-CM | POA: Diagnosis not present

## 2023-04-06 DIAGNOSIS — N186 End stage renal disease: Secondary | ICD-10-CM | POA: Diagnosis not present

## 2023-04-06 DIAGNOSIS — N2581 Secondary hyperparathyroidism of renal origin: Secondary | ICD-10-CM | POA: Diagnosis not present

## 2023-04-08 DIAGNOSIS — L98499 Non-pressure chronic ulcer of skin of other sites with unspecified severity: Secondary | ICD-10-CM | POA: Diagnosis not present

## 2023-04-08 DIAGNOSIS — Z992 Dependence on renal dialysis: Secondary | ICD-10-CM | POA: Diagnosis not present

## 2023-04-08 DIAGNOSIS — N2581 Secondary hyperparathyroidism of renal origin: Secondary | ICD-10-CM | POA: Diagnosis not present

## 2023-04-08 DIAGNOSIS — L89893 Pressure ulcer of other site, stage 3: Secondary | ICD-10-CM | POA: Diagnosis not present

## 2023-04-08 DIAGNOSIS — L89623 Pressure ulcer of left heel, stage 3: Secondary | ICD-10-CM | POA: Diagnosis not present

## 2023-04-08 DIAGNOSIS — N186 End stage renal disease: Secondary | ICD-10-CM | POA: Diagnosis not present

## 2023-04-09 DIAGNOSIS — L89620 Pressure ulcer of left heel, unstageable: Secondary | ICD-10-CM | POA: Diagnosis not present

## 2023-04-10 DIAGNOSIS — N186 End stage renal disease: Secondary | ICD-10-CM | POA: Diagnosis not present

## 2023-04-10 DIAGNOSIS — Z992 Dependence on renal dialysis: Secondary | ICD-10-CM | POA: Diagnosis not present

## 2023-04-10 DIAGNOSIS — N2581 Secondary hyperparathyroidism of renal origin: Secondary | ICD-10-CM | POA: Diagnosis not present

## 2023-04-13 DIAGNOSIS — Z992 Dependence on renal dialysis: Secondary | ICD-10-CM | POA: Diagnosis not present

## 2023-04-13 DIAGNOSIS — N186 End stage renal disease: Secondary | ICD-10-CM | POA: Diagnosis not present

## 2023-04-13 DIAGNOSIS — N2581 Secondary hyperparathyroidism of renal origin: Secondary | ICD-10-CM | POA: Diagnosis not present

## 2023-04-15 DIAGNOSIS — L89893 Pressure ulcer of other site, stage 3: Secondary | ICD-10-CM | POA: Diagnosis not present

## 2023-04-15 DIAGNOSIS — Z992 Dependence on renal dialysis: Secondary | ICD-10-CM | POA: Diagnosis not present

## 2023-04-15 DIAGNOSIS — L89623 Pressure ulcer of left heel, stage 3: Secondary | ICD-10-CM | POA: Diagnosis not present

## 2023-04-15 DIAGNOSIS — L98499 Non-pressure chronic ulcer of skin of other sites with unspecified severity: Secondary | ICD-10-CM | POA: Diagnosis not present

## 2023-04-15 DIAGNOSIS — N2581 Secondary hyperparathyroidism of renal origin: Secondary | ICD-10-CM | POA: Diagnosis not present

## 2023-04-15 DIAGNOSIS — N186 End stage renal disease: Secondary | ICD-10-CM | POA: Diagnosis not present

## 2023-04-18 DIAGNOSIS — Z992 Dependence on renal dialysis: Secondary | ICD-10-CM | POA: Diagnosis not present

## 2023-04-18 DIAGNOSIS — N2581 Secondary hyperparathyroidism of renal origin: Secondary | ICD-10-CM | POA: Diagnosis not present

## 2023-04-18 DIAGNOSIS — N186 End stage renal disease: Secondary | ICD-10-CM | POA: Diagnosis not present

## 2023-04-19 DIAGNOSIS — N186 End stage renal disease: Secondary | ICD-10-CM | POA: Diagnosis not present

## 2023-04-19 DIAGNOSIS — Z992 Dependence on renal dialysis: Secondary | ICD-10-CM | POA: Diagnosis not present

## 2023-04-19 DIAGNOSIS — R197 Diarrhea, unspecified: Secondary | ICD-10-CM | POA: Diagnosis not present

## 2023-04-19 DIAGNOSIS — N2581 Secondary hyperparathyroidism of renal origin: Secondary | ICD-10-CM | POA: Diagnosis not present

## 2023-04-21 DIAGNOSIS — N186 End stage renal disease: Secondary | ICD-10-CM | POA: Diagnosis not present

## 2023-04-21 DIAGNOSIS — N2581 Secondary hyperparathyroidism of renal origin: Secondary | ICD-10-CM | POA: Diagnosis not present

## 2023-04-21 DIAGNOSIS — Z992 Dependence on renal dialysis: Secondary | ICD-10-CM | POA: Diagnosis not present

## 2023-04-21 DIAGNOSIS — R197 Diarrhea, unspecified: Secondary | ICD-10-CM | POA: Diagnosis not present

## 2023-04-23 DIAGNOSIS — Z0001 Encounter for general adult medical examination with abnormal findings: Secondary | ICD-10-CM | POA: Diagnosis not present

## 2023-04-23 DIAGNOSIS — Z6827 Body mass index (BMI) 27.0-27.9, adult: Secondary | ICD-10-CM | POA: Diagnosis not present

## 2023-04-24 DIAGNOSIS — Z992 Dependence on renal dialysis: Secondary | ICD-10-CM | POA: Diagnosis not present

## 2023-04-24 DIAGNOSIS — R197 Diarrhea, unspecified: Secondary | ICD-10-CM | POA: Diagnosis not present

## 2023-04-24 DIAGNOSIS — N2581 Secondary hyperparathyroidism of renal origin: Secondary | ICD-10-CM | POA: Diagnosis not present

## 2023-04-24 DIAGNOSIS — N186 End stage renal disease: Secondary | ICD-10-CM | POA: Diagnosis not present

## 2023-04-26 DIAGNOSIS — N186 End stage renal disease: Secondary | ICD-10-CM | POA: Diagnosis not present

## 2023-04-26 DIAGNOSIS — N2581 Secondary hyperparathyroidism of renal origin: Secondary | ICD-10-CM | POA: Diagnosis not present

## 2023-04-26 DIAGNOSIS — Z992 Dependence on renal dialysis: Secondary | ICD-10-CM | POA: Diagnosis not present

## 2023-04-28 DIAGNOSIS — M5416 Radiculopathy, lumbar region: Secondary | ICD-10-CM | POA: Diagnosis not present

## 2023-04-28 DIAGNOSIS — M1610 Unilateral primary osteoarthritis, unspecified hip: Secondary | ICD-10-CM | POA: Diagnosis not present

## 2023-04-28 DIAGNOSIS — I129 Hypertensive chronic kidney disease with stage 1 through stage 4 chronic kidney disease, or unspecified chronic kidney disease: Secondary | ICD-10-CM | POA: Diagnosis not present

## 2023-04-28 DIAGNOSIS — N2581 Secondary hyperparathyroidism of renal origin: Secondary | ICD-10-CM | POA: Diagnosis not present

## 2023-04-28 DIAGNOSIS — L98499 Non-pressure chronic ulcer of skin of other sites with unspecified severity: Secondary | ICD-10-CM | POA: Diagnosis not present

## 2023-04-28 DIAGNOSIS — L89623 Pressure ulcer of left heel, stage 3: Secondary | ICD-10-CM | POA: Diagnosis not present

## 2023-04-28 DIAGNOSIS — Z992 Dependence on renal dialysis: Secondary | ICD-10-CM | POA: Diagnosis not present

## 2023-04-28 DIAGNOSIS — N186 End stage renal disease: Secondary | ICD-10-CM | POA: Diagnosis not present

## 2023-04-28 DIAGNOSIS — L89893 Pressure ulcer of other site, stage 3: Secondary | ICD-10-CM | POA: Diagnosis not present

## 2023-05-01 DIAGNOSIS — N186 End stage renal disease: Secondary | ICD-10-CM | POA: Diagnosis not present

## 2023-05-01 DIAGNOSIS — Z992 Dependence on renal dialysis: Secondary | ICD-10-CM | POA: Diagnosis not present

## 2023-05-01 DIAGNOSIS — N2581 Secondary hyperparathyroidism of renal origin: Secondary | ICD-10-CM | POA: Diagnosis not present

## 2023-05-04 DIAGNOSIS — N2581 Secondary hyperparathyroidism of renal origin: Secondary | ICD-10-CM | POA: Diagnosis not present

## 2023-05-04 DIAGNOSIS — N186 End stage renal disease: Secondary | ICD-10-CM | POA: Diagnosis not present

## 2023-05-04 DIAGNOSIS — Z992 Dependence on renal dialysis: Secondary | ICD-10-CM | POA: Diagnosis not present

## 2023-05-06 DIAGNOSIS — Z992 Dependence on renal dialysis: Secondary | ICD-10-CM | POA: Diagnosis not present

## 2023-05-06 DIAGNOSIS — N186 End stage renal disease: Secondary | ICD-10-CM | POA: Diagnosis not present

## 2023-05-06 DIAGNOSIS — N2581 Secondary hyperparathyroidism of renal origin: Secondary | ICD-10-CM | POA: Diagnosis not present

## 2023-05-08 DIAGNOSIS — N186 End stage renal disease: Secondary | ICD-10-CM | POA: Diagnosis not present

## 2023-05-08 DIAGNOSIS — N2581 Secondary hyperparathyroidism of renal origin: Secondary | ICD-10-CM | POA: Diagnosis not present

## 2023-05-08 DIAGNOSIS — Z992 Dependence on renal dialysis: Secondary | ICD-10-CM | POA: Diagnosis not present

## 2023-05-11 DIAGNOSIS — R051 Acute cough: Secondary | ICD-10-CM | POA: Diagnosis not present

## 2023-05-11 DIAGNOSIS — J019 Acute sinusitis, unspecified: Secondary | ICD-10-CM | POA: Diagnosis not present

## 2023-05-11 DIAGNOSIS — I120 Hypertensive chronic kidney disease with stage 5 chronic kidney disease or end stage renal disease: Secondary | ICD-10-CM | POA: Diagnosis not present

## 2023-05-11 DIAGNOSIS — G479 Sleep disorder, unspecified: Secondary | ICD-10-CM | POA: Diagnosis not present

## 2023-05-13 DIAGNOSIS — N186 End stage renal disease: Secondary | ICD-10-CM | POA: Diagnosis not present

## 2023-05-13 DIAGNOSIS — Z992 Dependence on renal dialysis: Secondary | ICD-10-CM | POA: Diagnosis not present

## 2023-05-13 DIAGNOSIS — N2581 Secondary hyperparathyroidism of renal origin: Secondary | ICD-10-CM | POA: Diagnosis not present

## 2023-05-15 DIAGNOSIS — N186 End stage renal disease: Secondary | ICD-10-CM | POA: Diagnosis not present

## 2023-05-15 DIAGNOSIS — Z992 Dependence on renal dialysis: Secondary | ICD-10-CM | POA: Diagnosis not present

## 2023-05-15 DIAGNOSIS — N2581 Secondary hyperparathyroidism of renal origin: Secondary | ICD-10-CM | POA: Diagnosis not present

## 2023-06-25 ENCOUNTER — Ambulatory Visit: Payer: BC Managed Care – PPO | Admitting: Adult Health

## 2023-06-30 ENCOUNTER — Ambulatory Visit: Payer: BC Managed Care – PPO | Admitting: Adult Health

## 2023-06-30 ENCOUNTER — Ambulatory Visit: Payer: BC Managed Care – PPO | Admitting: Internal Medicine

## 2023-07-24 NOTE — Progress Notes (Deleted)
  Cardiology Office Note:  .   Date:  07/24/2023  ID:  Alison Baker, DOB 1955/11/19, MRN [id number] PCP: Alison Corporal, PA  [facility] Providers Cardiologist:  Alison Fus, MD   History of Present Illness: .   Alison Baker is a 68 y.o. female with a past medical history of ESRD on HD, HTN, minimal CAD on CT. Patient is followed by Dr. Wyline Baker, presents today for a 4 month follow up appointment   Patient previously underwent echocardiogram in 10/2020 that showed EF 55-60%, normal LV contractility, no regional wall motion abnormalities. Later, a nuclear stress test in 03/2021 was negative for ischemia. More recently, coronary CTA from 05/2021 showed a coronary calcium score of 146 (88th percentile), minimal mixed CAD of the left main and mild LAD. Recommended aggressive cardiovascular risk factor modification.   Patient was last seen by cardiology on 02/26/23. At that time, her BP was elevated. She had recently been started on losartan 50 mg daily by her nephrologist. She was also on labetalol 100 mg BID and amlodipine 10 mg daily. Recommended consideration of increasing losartan vs starting hydralazine if her BP did not improve after starting losartan   HTN  - Currently on amlodipine 10 mg daily, losartan 50 mg daily, labetalol 100 mg BID   CAD  - Coronary CTA from 05/2021 showed a coronary calcium score of 146 (88th percentile), minimal mixed CAD in the left main and mid LAD  - Continue BP control as above  - In the past, Dr. Wyline Baker considered statin therapy but decided to hold off as there is mixed data on benefits of statins in HD patients ***   Chronic Positional Dizziness   ESRD on HD  - Followed by Alison Baker*** Kidney Baker   ROS: ***  Studies Reviewed: .        *** Risk Assessment/Calculations:   {Does this patient have ATRIAL FIBRILLATION?:[id number]} No BP recorded.  {Refresh Note OR Click here to enter BP  :1}***       Physical Exam:   VS:  There were no  vitals taken for this visit.   Wt Readings from Last 3 Encounters:  02/26/23 156 lb (70.8 kg)  12/30/22 157 lb 3.2 oz (71.3 kg)  08/16/21 174 lb 3.2 oz (79 kg)    GEN: Well nourished, well developed in no acute distress NECK: No JVD; No carotid bruits CARDIAC: ***RRR, no murmurs, rubs, gallops RESPIRATORY:  Clear to auscultation without rales, wheezing or rhonchi  ABDOMEN: Soft, non-tender, non-distended EXTREMITIES:  No edema; No deformity   ASSESSMENT AND PLAN: .   ***    {Are you ordering a CV Procedure (e.g. stress test, cath, DCCV, TEE, etc)?   Press F2        :[id number]}  Dispo: ***  Signed, Alison Albee, PA-C

## 2023-08-04 ENCOUNTER — Ambulatory Visit: Admitting: Internal Medicine

## 2023-08-06 ENCOUNTER — Ambulatory Visit: Admitting: Cardiology
